# Patient Record
Sex: Female | Born: 1955 | Hispanic: Yes | Smoking: Never smoker
Health system: Southern US, Community
[De-identification: ages and names within clinical notes are randomized; demographics above are authoritative.]

## PROBLEM LIST (undated history)

## (undated) DIAGNOSIS — M1990 Unspecified osteoarthritis, unspecified site: Secondary | ICD-10-CM

## (undated) HISTORY — PX: OVARIAN CYST REMOVAL: SHX89

---

## 2014-06-18 ENCOUNTER — Ambulatory Visit: Payer: Self-pay | Admitting: Primary Care

## 2014-09-13 ENCOUNTER — Emergency Department: Payer: Self-pay | Admitting: Emergency Medicine

## 2015-12-16 ENCOUNTER — Other Ambulatory Visit: Payer: Self-pay | Admitting: Family Medicine

## 2015-12-16 DIAGNOSIS — E7800 Pure hypercholesterolemia, unspecified: Secondary | ICD-10-CM | POA: Insufficient documentation

## 2015-12-16 DIAGNOSIS — N6489 Other specified disorders of breast: Secondary | ICD-10-CM

## 2015-12-17 ENCOUNTER — Other Ambulatory Visit: Payer: Self-pay | Admitting: Family Medicine

## 2015-12-17 DIAGNOSIS — N6489 Other specified disorders of breast: Secondary | ICD-10-CM

## 2016-01-05 ENCOUNTER — Ambulatory Visit
Admission: RE | Admit: 2016-01-05 | Discharge: 2016-01-05 | Disposition: A | Discharge status: Home / Self Care | Payer: BLUE CROSS/BLUE SHIELD | Source: Ambulatory Visit | Attending: Family Medicine | Admitting: Family Medicine

## 2016-01-05 DIAGNOSIS — N6489 Other specified disorders of breast: Secondary | ICD-10-CM

## 2016-03-04 ENCOUNTER — Encounter: Payer: Self-pay | Admitting: *Deleted

## 2016-03-07 ENCOUNTER — Ambulatory Visit
Admission: RE | Admit: 2016-03-07 | Payer: BLUE CROSS/BLUE SHIELD | Source: Ambulatory Visit | Admitting: Unknown Physician Specialty

## 2016-03-07 ENCOUNTER — Encounter: Admission: RE | Payer: Self-pay | Source: Ambulatory Visit

## 2016-03-07 SURGERY — COLONOSCOPY WITH PROPOFOL
Anesthesia: General

## 2016-08-05 ENCOUNTER — Encounter: Payer: Self-pay | Admitting: Emergency Medicine

## 2016-08-05 ENCOUNTER — Emergency Department
Admission: EM | Admit: 2016-08-05 | Discharge: 2016-08-06 | Disposition: A | Discharge status: Home / Self Care | Payer: BLUE CROSS/BLUE SHIELD | Attending: Emergency Medicine | Admitting: Emergency Medicine

## 2016-08-05 DIAGNOSIS — T466X5A Adverse effect of antihyperlipidemic and antiarteriosclerotic drugs, initial encounter: Secondary | ICD-10-CM | POA: Insufficient documentation

## 2016-08-05 DIAGNOSIS — L519 Erythema multiforme, unspecified: Secondary | ICD-10-CM | POA: Insufficient documentation

## 2016-08-05 DIAGNOSIS — T50905A Adverse effect of unspecified drugs, medicaments and biological substances, initial encounter: Secondary | ICD-10-CM

## 2016-08-05 DIAGNOSIS — R21 Rash and other nonspecific skin eruption: Secondary | ICD-10-CM | POA: Diagnosis present

## 2016-08-05 LAB — BASIC METABOLIC PANEL
Anion gap: 7 (ref 5–15)
BUN: 13 mg/dL (ref 6–20)
CHLORIDE: 107 mmol/L (ref 101–111)
CO2: 26 mmol/L (ref 22–32)
CREATININE: 0.6 mg/dL (ref 0.44–1.00)
Calcium: 9.9 mg/dL (ref 8.9–10.3)
Glucose, Bld: 117 mg/dL — ABNORMAL HIGH (ref 65–99)
POTASSIUM: 3.8 mmol/L (ref 3.5–5.1)
SODIUM: 140 mmol/L (ref 135–145)

## 2016-08-05 LAB — CBC WITH DIFFERENTIAL/PLATELET
BASOS ABS: 0.1 10*3/uL (ref 0–0.1)
Basophils Relative: 1 %
EOS ABS: 0.2 10*3/uL (ref 0–0.7)
Eosinophils Relative: 3 %
HCT: 42.7 % (ref 35.0–47.0)
HEMOGLOBIN: 14.6 g/dL (ref 12.0–16.0)
LYMPHS ABS: 1.4 10*3/uL (ref 1.0–3.6)
Lymphocytes Relative: 20 %
MCH: 29.6 pg (ref 26.0–34.0)
MCHC: 34.2 g/dL (ref 32.0–36.0)
MCV: 86.6 fL (ref 80.0–100.0)
Monocytes Absolute: 0.6 10*3/uL (ref 0.2–0.9)
Monocytes Relative: 9 %
NEUTROS PCT: 67 %
Neutro Abs: 4.9 10*3/uL (ref 1.4–6.5)
Platelets: 223 10*3/uL (ref 150–440)
RBC: 4.92 MIL/uL (ref 3.80–5.20)
RDW: 13.9 % (ref 11.5–14.5)
WBC: 7.2 10*3/uL (ref 3.6–11.0)

## 2016-08-05 LAB — SEDIMENTATION RATE: SED RATE: 5 mm/h (ref 0–30)

## 2016-08-05 MED ORDER — SILVER SULFADIAZINE 1 % EX CREA
TOPICAL_CREAM | Freq: Once | CUTANEOUS | Status: AC
  Administered 2016-08-05: 1 via TOPICAL

## 2016-08-05 MED ORDER — FAMOTIDINE 20 MG PO TABS
40.0000 mg | ORAL_TABLET | Freq: Once | ORAL | Status: AC
  Administered 2016-08-05: 40 mg via ORAL
  Filled 2016-08-05: qty 2

## 2016-08-05 MED ORDER — DIPHENHYDRAMINE HCL 50 MG/ML IJ SOLN
25.0000 mg | Freq: Once | INTRAMUSCULAR | Status: AC
  Administered 2016-08-05: 25 mg via INTRAVENOUS
  Filled 2016-08-05: qty 1

## 2016-08-05 MED ORDER — SILVER SULFADIAZINE 1 % EX CREA: TOPICAL_CREAM | CUTANEOUS | 0 refills | Status: DC

## 2016-08-05 MED ORDER — METHYLPREDNISOLONE SODIUM SUCC 125 MG IJ SOLR
125.0000 mg | Freq: Once | INTRAMUSCULAR | Status: AC
  Administered 2016-08-05: 125 mg via INTRAVENOUS
  Filled 2016-08-05: qty 2

## 2016-08-05 MED ORDER — SILVER SULFADIAZINE 1 % EX CREA
TOPICAL_CREAM | CUTANEOUS | Status: AC
  Filled 2016-08-05: qty 85

## 2016-08-05 MED ORDER — CYPROHEPTADINE HCL 4 MG PO TABS: 4.0000 mg | ORAL_TABLET | Freq: Three times a day (TID) | ORAL | 0 refills | Status: DC | PRN

## 2016-08-05 MED ORDER — PREDNISONE 20 MG PO TABS: 20.0000 mg | ORAL_TABLET | Freq: Every day | ORAL | 0 refills | Status: DC

## 2016-08-05 MED ORDER — RANITIDINE HCL 150 MG PO TABS: 150.0000 mg | ORAL_TABLET | Freq: Two times a day (BID) | ORAL | 0 refills | Status: DC

## 2016-08-05 NOTE — ED Provider Notes (Signed)
[facility] Emergency Department Provider Note ____________________________________________  Time seen: 2056  I have reviewed the triage vital signs and the nursing notes.  HISTORY  Chief Complaint  Rash  HPI Alyssa Vega is a 61 y.o. female presents to the ED for evaluation of bilateral rash to the lower legs with itching. The patient reports onset of the rash better than 3 weeks prior. She denies any known injury, accident, trauma, exposure denies any drug allergies, food allergies, or environmental or allergies.She denies fevers, chills, SOB, or mucous membrane involvement. She admits to treating the itching with several home remedies including: alcohol, hydrogen peroxide, iodine, apple cider vinegar, and a garlic-lemon paste. She describes the rash begins as red 'rash' then blisters develop. She has open blister beds where she applied the garlic-lemon paste. She denies spread above the calves and lower legs.   History reviewed. No pertinent past medical history.  There are no active problems to display for this patient.  Past Surgical History:  Procedure Laterality Date  . OVARIAN CYST REMOVAL      Prior to Admission medications   Medication Sig Start Date End Date Taking? Authorizing Provider  acetaminophen (TYLENOL) 500 MG chewable tablet Chew 500 mg by mouth every 6 (six) hours as needed for pain.    Historical Provider, MD  Ascorbic Acid (VITAMIN C) 1000 MG tablet Take 1,000 mg by mouth daily.    Historical Provider, MD  b complex vitamins tablet Take 1 tablet by mouth daily.    Historical Provider, MD  calcium carbonate (OSCAL) 1500 (600 Ca) MG TABS tablet Take by mouth 2 (two) times daily with a meal.    Historical Provider, MD  Cholecalciferol 1000 units TBDP Take by mouth.    Historical Provider, MD  cyproheptadine (PERIACTIN) 4 MG tablet Take 1 tablet (4 mg total) by mouth 3 (three) times daily as needed for allergies. 08/05/16   Jeniece Hannis V Bacon  Jamiah Recore, PA-C  glucosamine-chondroitin 500-400 MG tablet Take 1 tablet by mouth 3 (three) times daily.    Historical Provider, MD  lovastatin (MEVACOR) 20 MG tablet Take 20 mg by mouth at bedtime.    Historical Provider, MD  predniSONE (DELTASONE) 20 MG tablet Take 1 tablet (20 mg total) by mouth daily with breakfast. 08/05/16   Dannielle Karvonen Quinnton Bury, PA-C  ranitidine (ZANTAC) 150 MG tablet Take 1 tablet (150 mg total) by mouth 2 (two) times daily. 08/05/16   Maite Burlison V Bacon Johann Santone, PA-C  silver sulfADIAZINE (SILVADENE) 1 % cream Apply to affected area daily 08/05/16   Theola Cuellar V Bacon Lonni Dirden, PA-C    Allergies Cyclobenzaprine and Meloxicam  Family History  Problem Relation Age of Onset  . Breast cancer Neg Hx     Social History Social History  Substance Use Topics  . Smoking status: Never Smoker  . Smokeless tobacco: Never Used  . Alcohol use No    Review of Systems  Constitutional: Negative for fever. Eyes: Negative for visual changes. ENT: Negative for sore throat. Cardiovascular: Negative for chest pain. Respiratory: Negative for shortness of breath. Gastrointestinal: Negative for abdominal pain, vomiting and diarrhea. Genitourinary: Negative for dysuria. Musculoskeletal: Negative for back pain. Skin: Negative for rash. Neurological: Negative for headaches, focal weakness or numbness. ____________________________________________  PHYSICAL EXAM:  VITAL SIGNS: ED Triage Vitals  Enc Vitals Group     BP 08/05/16 2037 (!) 158/72     Pulse Rate 08/05/16 2037 68     Resp 08/05/16 2037 14  Temp 08/05/16 2037 98.3 F (36.8 C)     Temp Source 08/05/16 2037 Oral     SpO2 08/05/16 2037 100 %     Weight 08/05/16 2037 145 lb (65.8 kg)     Height --      Head Circumference --      Peak Flow --      Pain Score 08/05/16 2038 0     Pain Loc --      Pain Edu? --      Excl. in [place]? --     Constitutional: Alert and oriented. Well appearing and in no distress. Head:  Normocephalic and atraumatic. Eyes: Conjunctivae are normal. PERRL. Normal extraocular movements Ears: Canals clear. TMs intact bilaterally. Nose: No congestion/rhinorrhea/epistaxis. Mouth/Throat: Mucous membranes are moist. Neck: Supple. No thyromegaly. Hematological/Lymphatic/Immunological: No cervical lymphadenopathy. Cardiovascular: Normal rate, regular rhythm. Normal distal pulses. Respiratory: Normal respiratory effort. No wheezes/rales/rhonchi. Gastrointestinal: Soft and nontender. No distention. Musculoskeletal: Nontender with normal range of motion in all extremities.  Neurologic:  Normal gait without ataxia. Normal speech and language. No gross focal neurologic deficits are appreciated. Skin:  Skin is warm, dry and intact. Patient with multiple hyperpigmented flat blisters to the lower legs bilaterally. They appear to be scarred from irritant contact. She also has erythematous, non-palpable, non-blanchable lesions over the calves bilaterally.  ____________________________________________   LABS (pertinent positives/negatives)  Labs Reviewed  BASIC METABOLIC PANEL - Abnormal; Notable for the following:       Result Value   Glucose, Bld 117 (*)    All other components within normal limits  CBC WITH DIFFERENTIAL/PLATELET  SEDIMENTATION RATE  C-REACTIVE PROTEIN  ____________________________________________  PROCEDURES  Solumedrol 125 mg IVP Benadryl 25 mg IVP Famotidine 40 mg PO Silvadene ointment  ____________________________________________  INITIAL IMPRESSION / ASSESSMENT AND PLAN / ED COURSE  Patient with a bilateral lower extremities rash which may represent a drug reaction secondary to her statin medication. Patient is advised to discontinue dosing of her statin at this time. She'll be discharged with prescriptions for any bleeding, Periactin, and prednisone. She will also apply Silvadene cream to the legs open wound. She is advised to discontinue all over-the-counter  topical medications at home remedies previously utilized. She will follow-up with her primary care provider or [facility] for ongoing symptom management.  ____________________________________________  FINAL CLINICAL IMPRESSION(S) / ED DIAGNOSES  Final diagnoses:  Adverse effect of drug, initial encounter  Erythema multiforme      Melvenia Needles, PA-C 08/06/16 0009    Nena Polio, MD 08/08/16 [phone]

## 2016-08-05 NOTE — ED Triage Notes (Signed)
Pt with bilateral lower leg rash and pain with itching. Pt with flat rash almost like venous stasis ulcers but flat noted to bilateral lower legs from ankles to mid tibial areas. Pt denies known fever. Pt does have areas of open skin with serous drainage noted.

## 2016-08-05 NOTE — Discharge Instructions (Signed)
Your labs were normal today. Your rash may be related to your cholesterol medicine. You should consider stopping the medicine and talking to your provider about other options. Keep the skin clean with soap & water only. Apply the antibiotic ointment on the open wounds and cover with a dressing. Take the prescription meds for itch and inflammation as directed.

## 2016-08-05 NOTE — ED Notes (Signed)
Flat darkened purple to red/pink rash noted to bilteral lowre extremities. Pt states it itches and is painful with itching. Pt does have an area of open skin noted to left lateral mid tibial area that is approx 2.4 cmx2 cm. Drainage is serous. Pt states rash has been present for 2 weeks. Pt states she has been putting lemon juice and garlic on legs for 2 weeks but they are getting worse. Pt denies medical problems or other symptoms. Pt denies pain while at rest.

## 2016-08-05 NOTE — ED Notes (Signed)
Patient ambulatory to stat desk without difficulty or distress noted.  Reports bilateral leg pain for approximately 2 weeks.

## 2016-08-06 LAB — C-REACTIVE PROTEIN: CRP: <0.8 mg/dL (ref <1.0)

## 2017-01-17 DIAGNOSIS — R519 Headache, unspecified: Secondary | ICD-10-CM | POA: Insufficient documentation

## 2017-11-27 ENCOUNTER — Other Ambulatory Visit: Payer: Self-pay | Admitting: Family Medicine

## 2017-11-27 DIAGNOSIS — Z1231 Encounter for screening mammogram for malignant neoplasm of breast: Secondary | ICD-10-CM

## 2017-12-15 ENCOUNTER — Ambulatory Visit
Admission: RE | Admit: 2017-12-15 | Discharge: 2017-12-15 | Disposition: A | Discharge status: Home / Self Care | Payer: BLUE CROSS/BLUE SHIELD | Source: Ambulatory Visit | Attending: Family Medicine | Admitting: Family Medicine

## 2017-12-15 DIAGNOSIS — Z1231 Encounter for screening mammogram for malignant neoplasm of breast: Secondary | ICD-10-CM | POA: Diagnosis not present

## 2017-12-18 ENCOUNTER — Other Ambulatory Visit: Payer: Self-pay | Admitting: Family Medicine

## 2017-12-18 DIAGNOSIS — N6489 Other specified disorders of breast: Secondary | ICD-10-CM

## 2017-12-18 DIAGNOSIS — R928 Other abnormal and inconclusive findings on diagnostic imaging of breast: Secondary | ICD-10-CM

## 2017-12-22 ENCOUNTER — Ambulatory Visit
Admission: RE | Admit: 2017-12-22 | Discharge: 2017-12-22 | Disposition: A | Discharge status: Home / Self Care | Payer: BLUE CROSS/BLUE SHIELD | Source: Ambulatory Visit | Attending: Family Medicine | Admitting: Family Medicine

## 2017-12-22 DIAGNOSIS — N6489 Other specified disorders of breast: Secondary | ICD-10-CM

## 2017-12-22 DIAGNOSIS — R928 Other abnormal and inconclusive findings on diagnostic imaging of breast: Secondary | ICD-10-CM | POA: Diagnosis not present

## 2018-07-11 ENCOUNTER — Other Ambulatory Visit: Payer: Self-pay

## 2018-07-11 ENCOUNTER — Emergency Department: Payer: BLUE CROSS/BLUE SHIELD

## 2018-07-11 ENCOUNTER — Emergency Department
Admission: EM | Admit: 2018-07-11 | Discharge: 2018-07-11 | Disposition: A | Discharge status: Home / Self Care | Payer: BLUE CROSS/BLUE SHIELD | Attending: Emergency Medicine | Admitting: Emergency Medicine

## 2018-07-11 DIAGNOSIS — R05 Cough: Secondary | ICD-10-CM | POA: Insufficient documentation

## 2018-07-11 DIAGNOSIS — R748 Abnormal levels of other serum enzymes: Secondary | ICD-10-CM | POA: Insufficient documentation

## 2018-07-11 DIAGNOSIS — J111 Influenza due to unidentified influenza virus with other respiratory manifestations: Secondary | ICD-10-CM | POA: Diagnosis not present

## 2018-07-11 DIAGNOSIS — R0602 Shortness of breath: Secondary | ICD-10-CM | POA: Insufficient documentation

## 2018-07-11 DIAGNOSIS — R69 Illness, unspecified: Secondary | ICD-10-CM

## 2018-07-11 DIAGNOSIS — Z79899 Other long term (current) drug therapy: Secondary | ICD-10-CM | POA: Diagnosis not present

## 2018-07-11 DIAGNOSIS — R509 Fever, unspecified: Secondary | ICD-10-CM | POA: Diagnosis present

## 2018-07-11 LAB — URINALYSIS, COMPLETE (UACMP) WITH MICROSCOPIC
Bacteria, UA: NONE SEEN
Bilirubin Urine: NEGATIVE
Glucose, UA: NEGATIVE mg/dL
Hgb urine dipstick: NEGATIVE
Ketones, ur: NEGATIVE mg/dL
Leukocytes, UA: NEGATIVE
Nitrite: NEGATIVE
Protein, ur: NEGATIVE mg/dL
SPECIFIC GRAVITY, URINE: 1.004 — AB (ref 1.005–1.030)
Squamous Epithelial / HPF: NONE SEEN (ref 0–5)
pH: 7 (ref 5.0–8.0)

## 2018-07-11 LAB — FIBRIN DERIVATIVES D-DIMER (ARMC ONLY): Fibrin derivatives D-dimer (ARMC): 399.49 ng/mL (FEU) (ref 0.00–499.00)

## 2018-07-11 LAB — COMPREHENSIVE METABOLIC PANEL
ALK PHOS: 69 U/L (ref 38–126)
ALT: 111 U/L — ABNORMAL HIGH (ref 0–44)
AST: 61 U/L — ABNORMAL HIGH (ref 15–41)
Albumin: 4.7 g/dL (ref 3.5–5.0)
Anion gap: 10 (ref 5–15)
BUN: 11 mg/dL (ref 8–23)
CO2: 23 mmol/L (ref 22–32)
Calcium: 9.5 mg/dL (ref 8.9–10.3)
Chloride: 106 mmol/L (ref 98–111)
Creatinine, Ser: 0.5 mg/dL (ref 0.44–1.00)
GFR calc Af Amer: >60 mL/min (ref >60)
GFR calc non Af Amer: >60 mL/min (ref >60)
Glucose, Bld: 121 mg/dL — ABNORMAL HIGH (ref 70–99)
Potassium: 3.6 mmol/L (ref 3.5–5.1)
Sodium: 139 mmol/L (ref 135–145)
Total Bilirubin: 1 mg/dL (ref 0.3–1.2)
Total Protein: 7.2 g/dL (ref 6.5–8.1)

## 2018-07-11 LAB — INFLUENZA PANEL BY PCR (TYPE A & B)
Influenza A By PCR: NEGATIVE
Influenza B By PCR: NEGATIVE

## 2018-07-11 LAB — TROPONIN I: Troponin I: <0.03 ng/mL (ref <0.03)

## 2018-07-11 LAB — CBC
HCT: 44.5 % (ref 36.0–46.0)
HEMOGLOBIN: 14.9 g/dL (ref 12.0–15.0)
MCH: 29.3 pg (ref 26.0–34.0)
MCHC: 33.5 g/dL (ref 30.0–36.0)
MCV: 87.4 fL (ref 80.0–100.0)
Platelets: 201 10*3/uL (ref 150–400)
RBC: 5.09 MIL/uL (ref 3.87–5.11)
RDW: 13.5 % (ref 11.5–15.5)
WBC: 5 10*3/uL (ref 4.0–10.5)
nRBC: 0 % (ref 0.0–0.2)

## 2018-07-11 LAB — LIPASE, BLOOD: LIPASE: 37 U/L (ref 11–51)

## 2018-07-11 LAB — BRAIN NATRIURETIC PEPTIDE: B NATRIURETIC PEPTIDE 5: 49 pg/mL (ref 0.0–100.0)

## 2018-07-11 MED ORDER — ALBUTEROL SULFATE HFA 108 (90 BASE) MCG/ACT IN AERS: 2.0000 | INHALATION_SPRAY | Freq: Four times a day (QID) | RESPIRATORY_TRACT | 0 refills | Status: DC | PRN

## 2018-07-11 MED ORDER — IPRATROPIUM-ALBUTEROL 0.5-2.5 (3) MG/3ML IN SOLN
3.0000 mL | Freq: Once | RESPIRATORY_TRACT | Status: AC
  Administered 2018-07-11: 3 mL via RESPIRATORY_TRACT
  Filled 2018-07-11: qty 3

## 2018-07-11 MED ORDER — SODIUM CHLORIDE 0.9 % IV BOLUS
1000.0000 mL | Freq: Once | INTRAVENOUS | Status: AC
  Administered 2018-07-11: 1000 mL via INTRAVENOUS

## 2018-07-11 MED ORDER — PREDNISONE 20 MG PO TABS
60.0000 mg | ORAL_TABLET | Freq: Once | ORAL | Status: AC
  Administered 2018-07-11: 60 mg via ORAL
  Filled 2018-07-11: qty 3

## 2018-07-11 NOTE — ED Notes (Signed)
Patient transported to X-ray 

## 2018-07-11 NOTE — ED Provider Notes (Signed)
[facility] Emergency Department Provider Note  ____________________________________________  Time seen: Approximately 11:18 AM  I have reviewed the triage vital signs and the nursing notes.   HISTORY  Chief Complaint Fever    HPI Alyssa Vega is a 62 y.o. female that presents to the emergency department for evaluation of feeling warm, tired, shortness of breath, nausea, body aches for 5 days. She felt like she had a fever yesterday but did not take her temperature.  She has had an occasional nonproductive cough.  She has also had an occasional headache. Headache wraps around her head.  She does not have a headache currently.  She has had nausea but no vomiting.  She had 5 loose stools yesterday.  No blood or mucus in stools.  Patient states that she has had episodes of tiredness and weakness for several months that she has felt like it was coming from her tizanidine that she takes for chronic pain.  Self discontinued medication 2 weeks ago. She seemed to be feeling better until over the weekend. No vomiting.  No sick contacts. She does not smoke.  No nasal congestion, sore throat, chest pain, abdominal pain.   No past medical history on file.  There are no active problems to display for this patient.   Past Surgical History:  Procedure Laterality Date  . OVARIAN CYST REMOVAL      Prior to Admission medications   Medication Sig Start Date End Date Taking? Authorizing Provider  acetaminophen (TYLENOL) 500 MG chewable tablet Chew 500 mg by mouth every 6 (six) hours as needed for pain.    [provider]  albuterol (PROVENTIL HFA;VENTOLIN HFA) 108 (90 Base) MCG/ACT inhaler Inhale 2 puffs into the lungs every 6 (six) hours as needed for wheezing or shortness of breath. 07/11/18   Enid Derry, PA-C  Ascorbic Acid (VITAMIN C) 1000 MG tablet Take 1,000 mg by mouth daily.    [provider]  b complex vitamins tablet Take 1 tablet by mouth  daily.    [provider]  calcium carbonate (OSCAL) 1500 (600 Ca) MG TABS tablet Take by mouth 2 (two) times daily with a meal.    [provider]  Cholecalciferol 1000 units TBDP Take by mouth.    [provider]  cyproheptadine (PERIACTIN) 4 MG tablet Take 1 tablet (4 mg total) by mouth 3 (three) times daily as needed for allergies. 08/05/16   Menshew, Charlesetta Ivory, PA-C  glucosamine-chondroitin 500-400 MG tablet Take 1 tablet by mouth 3 (three) times daily.    [provider]  lovastatin (MEVACOR) 20 MG tablet Take 20 mg by mouth at bedtime.    [provider]  predniSONE (DELTASONE) 20 MG tablet Take 1 tablet (20 mg total) by mouth daily with breakfast. 08/05/16   Menshew, Charlesetta Ivory, PA-C  ranitidine (ZANTAC) 150 MG tablet Take 1 tablet (150 mg total) by mouth 2 (two) times daily. 08/05/16   Menshew, Charlesetta Ivory, PA-C  silver sulfADIAZINE (SILVADENE) 1 % cream Apply to affected area daily 08/05/16   Menshew, Charlesetta Ivory, PA-C    Allergies Cyclobenzaprine and Meloxicam  Family History  Problem Relation Age of Onset  . Breast cancer Neg Hx     Social History Social History   Tobacco Use  . Smoking status: Never Smoker  . Smokeless tobacco: Never Used  Substance Use Topics  . Alcohol use: No  . Drug use: No     Review of Systems  Constitutional: Positive for feeling warm. Eyes: No visual changes. No discharge. ENT: Negative for congestion and rhinorrhea. Cardiovascular: No chest pain. Respiratory: Positive for cough and SOB. Gastrointestinal: No abdominal pain.  No nausea, no vomiting.  Positive for diarrhea.  No constipation. Musculoskeletal: Positive for body aches. Skin: Negative for rash, abrasions, lacerations, ecchymosis. Neurological: Positive for headaches.   ____________________________________________   PHYSICAL EXAM:  VITAL SIGNS: ED Triage Vitals  Enc Vitals Group     BP 07/11/18 1056 (!) 156/75      Pulse Rate 07/11/18 1056 77     Resp 07/11/18 1056 16     Temp 07/11/18 1056 97.7 F (36.5 C)     Temp Source 07/11/18 1056 Oral     SpO2 07/11/18 1056 97 %     Weight 07/11/18 1102 145 lb (65.8 kg)     Height 07/11/18 1102 5\' 2"  (1.575 m)     Head Circumference --      Peak Flow --      Pain Score 07/11/18 1101 0     Pain Loc --      Pain Edu? --      Excl. in [place]? --      Constitutional: Alert and oriented. Well appearing and in no acute distress. Eyes: Conjunctivae are normal. PERRL. EOMI. No discharge. Head: Atraumatic. ENT: No frontal and maxillary sinus tenderness.      Ears: Tympanic membranes pearly gray with good landmarks. No discharge.      Nose: No congestion/rhinnorhea.      Mouth/Throat: Mucous membranes are moist. Oropharynx non-erythematous. Tonsils not enlarged. No exudates. Uvula midline. Neck: No stridor.   Hematological/Lymphatic/Immunilogical: No cervical lymphadenopathy. Cardiovascular: Normal rate, regular rhythm.  Good peripheral circulation. Respiratory: Normal respiratory effort without tachypnea or retractions. Lungs CTAB. Good air entry to the bases with no decreased or absent breath sounds. Gastrointestinal: Bowel sounds 4 quadrants. Soft and nontender to palpation. No guarding or rigidity. No palpable masses. No distention. Musculoskeletal: Full range of motion to all extremities. No gross deformities appreciated.  Normal gait. Neurologic:  Normal speech and language. No gross focal neurologic deficits are appreciated.  Skin:  Skin is warm, dry and intact. No rash noted. Psychiatric: Mood and affect are normal. Speech and behavior are normal. Patient exhibits appropriate insight and judgement.   ____________________________________________   LABS (all labs ordered are listed, but only abnormal results are displayed)  Labs Reviewed  COMPREHENSIVE METABOLIC PANEL - Abnormal; Notable for the following components:      Result Value   Glucose, Bld  121 (*)    AST 61 (*)    ALT 111 (*)    All other components within normal limits  URINALYSIS, COMPLETE (UACMP) WITH MICROSCOPIC - Abnormal; Notable for the following components:   Color, Urine STRAW (*)    APPearance CLEAR (*)    Specific Gravity, Urine 1.004 (*)    All other components within normal limits  INFLUENZA PANEL BY PCR (TYPE A & B)  CBC  LIPASE, BLOOD  TROPONIN I  FIBRIN DERIVATIVES D-DIMER ([facility] ONLY)  BRAIN NATRIURETIC PEPTIDE   ____________________________________________  EKG  SR ____________________________________________  RADIOLOGY Lexine BatonI, Kalisa Girtman, personally viewed and evaluated these images (plain radiographs) as part of my medical decision making, as well as reviewing the written report by the radiologist.  Dg Chest 2 View  Result Date: 07/11/2018 CLINICAL DATA:  Fever, shortness of breath, vomiting. EXAM: CHEST - 2 VIEW COMPARISON:  None FINDINGS: Thoracic spondylosis. Atherosclerotic calcification of the  aortic arch. Heart size within normal limits. The lungs appear clear. IMPRESSION: 1. No pneumonia is identified. 2. Thoracic spondylosis. 3.  Aortic Atherosclerosis (ICD10-I70.0). Electronically Signed   By: Gaylyn RongWalter  Liebkemann M.D.   On: 07/11/2018 12:15    ____________________________________________    PROCEDURES  Procedure(s) performed:    Procedures    Medications  sodium chloride 0.9 % bolus 1,000 mL (0 mLs Intravenous Stopped 07/11/18 1408)  ipratropium-albuterol (DUONEB) 0.5-2.5 (3) MG/3ML nebulizer solution 3 mL (3 mLs Nebulization Given 07/11/18 1245)  predniSONE (DELTASONE) tablet 60 mg (60 mg Oral Given 07/11/18 1414)     ____________________________________________   INITIAL IMPRESSION / ASSESSMENT AND PLAN / ED COURSE  Pertinent labs & imaging results that were available during my care of the patient were reviewed by me and considered in my medical decision making (see chart for details).  Review of the [place] CSRS was  performed in accordance of the [facility] prior to dispensing any controlled drugs.   Patient presented to the emergency department for evaluation of multiple symptoms. Vital signs and exam are reassuring.  Chest x-ray negative for acute cardiopulmonary processes.  Lungs are clear to auscultation bilaterally.  Influenza test is negative but patient has had symptoms for more than 48 hours so this may not be accurate.  CBC is within normal limits.  CMP remarkable for slightly increased liver enzymes that patient will follow-up with primary care for.  No vomiting or abdominal pain.  Abdomen is soft and nontender.  Troponin is negative.  D-dimer and BNP is within normal limits.  EKG shows normal sinus rhythm.  No infection on urinalysis.  Patient felt better in the emergency department after receiving fluids and DuoNeb.  Patient appears well and is staying well hydrated.  Patient appears comfortable.  Patient feels comfortable going home. Patient will be discharged home with prescriptions for albuterol inhaler. Patient is to follow up with primary care as needed or otherwise directed. Patient is given ED precautions to return to the ED for any worsening or new symptoms.     ____________________________________________  FINAL CLINICAL IMPRESSION(S) / ED DIAGNOSES  Final diagnoses:  Influenza-like illness  Elevated liver enzymes      NEW MEDICATIONS STARTED DURING THIS VISIT:  ED Discharge Orders         Ordered    albuterol (PROVENTIL HFA;VENTOLIN HFA) 108 (90 Base) MCG/ACT inhaler  Every 6 hours PRN     07/11/18 1401              This chart was dictated using voice recognition software/Dragon. Despite best efforts to proofread, errors can occur which can change the meaning. Any change was purely unintentional.    Enid DerryWagner, Jadarrius Maselli, PA-C 07/11/18 1654    Emily FilbertWilliams, Jonathan E, MD 07/12/18 [phone]

## 2018-07-11 NOTE — Discharge Instructions (Addendum)
Your blood work and testing is reassuring.  It sounds like you have a virus.  Please use albuterol inhaler as needed for shortness of breath.  Your liver enzymes were mildly elevated in the emergency department.  Please follow-up with primary care this week or early next week for recheck and further work-up.

## 2018-07-11 NOTE — ED Triage Notes (Addendum)
Yesterday fever. States tired and HA. Vomiting but no diarrhea. A&O, ambulatory.   Requesting interpreter. Afebrile in triage.

## 2019-03-21 DIAGNOSIS — Z8673 Personal history of transient ischemic attack (TIA), and cerebral infarction without residual deficits: Secondary | ICD-10-CM | POA: Insufficient documentation

## 2019-04-01 ENCOUNTER — Other Ambulatory Visit: Payer: Self-pay | Admitting: Physical Medicine and Rehabilitation

## 2019-04-01 DIAGNOSIS — M5412 Radiculopathy, cervical region: Secondary | ICD-10-CM

## 2019-04-01 DIAGNOSIS — M5414 Radiculopathy, thoracic region: Secondary | ICD-10-CM

## 2019-04-11 ENCOUNTER — Ambulatory Visit
Admission: RE | Admit: 2019-04-11 | Discharge: 2019-04-11 | Disposition: A | Discharge status: Home / Self Care | Payer: BLUE CROSS/BLUE SHIELD | Source: Ambulatory Visit | Attending: Physical Medicine and Rehabilitation | Admitting: Physical Medicine and Rehabilitation

## 2019-04-11 ENCOUNTER — Other Ambulatory Visit: Payer: Self-pay

## 2019-04-11 DIAGNOSIS — M5414 Radiculopathy, thoracic region: Secondary | ICD-10-CM | POA: Insufficient documentation

## 2019-04-11 DIAGNOSIS — M5412 Radiculopathy, cervical region: Secondary | ICD-10-CM | POA: Insufficient documentation

## 2019-05-03 DIAGNOSIS — F411 Generalized anxiety disorder: Secondary | ICD-10-CM | POA: Insufficient documentation

## 2019-05-10 ENCOUNTER — Other Ambulatory Visit (HOSPITAL_COMMUNITY): Payer: Self-pay | Admitting: Student

## 2019-05-10 ENCOUNTER — Other Ambulatory Visit: Payer: Self-pay | Admitting: Student

## 2019-05-10 DIAGNOSIS — R1013 Epigastric pain: Secondary | ICD-10-CM

## 2019-05-27 ENCOUNTER — Ambulatory Visit
Admission: RE | Admit: 2019-05-27 | Discharge: 2019-05-27 | Disposition: A | Discharge status: Home / Self Care | Payer: BLUE CROSS/BLUE SHIELD | Source: Ambulatory Visit | Attending: Student | Admitting: Student

## 2019-05-27 ENCOUNTER — Other Ambulatory Visit: Payer: Self-pay

## 2019-05-27 DIAGNOSIS — R1013 Epigastric pain: Secondary | ICD-10-CM | POA: Insufficient documentation

## 2019-06-28 ENCOUNTER — Other Ambulatory Visit: Payer: Self-pay | Admitting: Obstetrics and Gynecology

## 2019-06-28 DIAGNOSIS — Z1231 Encounter for screening mammogram for malignant neoplasm of breast: Secondary | ICD-10-CM

## 2019-08-23 ENCOUNTER — Other Ambulatory Visit: Payer: Self-pay | Admitting: Student

## 2019-08-23 DIAGNOSIS — R1010 Upper abdominal pain, unspecified: Secondary | ICD-10-CM

## 2019-08-23 DIAGNOSIS — R110 Nausea: Secondary | ICD-10-CM

## 2019-09-02 ENCOUNTER — Ambulatory Visit: Payer: 59

## 2019-09-16 ENCOUNTER — Ambulatory Visit
Admission: RE | Admit: 2019-09-16 | Discharge: 2019-09-16 | Disposition: A | Discharge status: Home / Self Care | Payer: 59 | Source: Ambulatory Visit | Attending: Student | Admitting: Student

## 2019-09-16 ENCOUNTER — Other Ambulatory Visit: Payer: Self-pay

## 2019-09-16 DIAGNOSIS — R1010 Upper abdominal pain, unspecified: Secondary | ICD-10-CM

## 2019-09-16 DIAGNOSIS — R110 Nausea: Secondary | ICD-10-CM | POA: Diagnosis present

## 2019-09-16 MED ORDER — TECHNETIUM TC 99M MEBROFENIN IV KIT
5.0000 | PACK | Freq: Once | INTRAVENOUS | Status: AC | PRN
  Administered 2019-09-16: 5.15 via INTRAVENOUS

## 2019-09-23 DIAGNOSIS — K760 Fatty (change of) liver, not elsewhere classified: Secondary | ICD-10-CM | POA: Insufficient documentation

## 2019-09-23 DIAGNOSIS — M5412 Radiculopathy, cervical region: Secondary | ICD-10-CM | POA: Insufficient documentation

## 2019-10-07 ENCOUNTER — Ambulatory Visit: Payer: 59

## 2019-10-16 ENCOUNTER — Ambulatory Visit: Admit: 2019-10-16 | Payer: 59 | Admitting: General Surgery

## 2019-10-16 SURGERY — ESOPHAGOGASTRODUODENOSCOPY (EGD) WITH PROPOFOL
Anesthesia: General

## 2020-03-10 ENCOUNTER — Other Ambulatory Visit: Payer: Self-pay | Admitting: Family Medicine

## 2020-03-10 DIAGNOSIS — M5441 Lumbago with sciatica, right side: Secondary | ICD-10-CM

## 2020-03-25 ENCOUNTER — Other Ambulatory Visit: Payer: Self-pay | Admitting: Physical Medicine and Rehabilitation

## 2020-03-25 DIAGNOSIS — M5412 Radiculopathy, cervical region: Secondary | ICD-10-CM

## 2020-03-30 ENCOUNTER — Ambulatory Visit
Admission: RE | Admit: 2020-03-30 | Discharge: 2020-03-30 | Disposition: A | Discharge status: Home / Self Care | Payer: Medicare Other | Source: Ambulatory Visit | Attending: Family Medicine | Admitting: Family Medicine

## 2020-03-30 ENCOUNTER — Ambulatory Visit
Admission: RE | Admit: 2020-03-30 | Discharge: 2020-03-30 | Disposition: A | Discharge status: Home / Self Care | Payer: Medicare Other | Source: Ambulatory Visit | Attending: Physical Medicine and Rehabilitation | Admitting: Physical Medicine and Rehabilitation

## 2020-03-30 ENCOUNTER — Other Ambulatory Visit: Payer: Self-pay

## 2020-03-30 DIAGNOSIS — M5441 Lumbago with sciatica, right side: Secondary | ICD-10-CM | POA: Insufficient documentation

## 2020-03-30 DIAGNOSIS — M5412 Radiculopathy, cervical region: Secondary | ICD-10-CM | POA: Insufficient documentation

## 2020-04-03 ENCOUNTER — Encounter: Payer: Self-pay | Admitting: Emergency Medicine

## 2020-04-03 ENCOUNTER — Inpatient Hospital Stay
Admission: EM | Admit: 2020-04-03 | Discharge: 2020-04-05 | DRG: 645 | Disposition: A | Discharge status: Home / Self Care | Payer: Medicare Other | Attending: Obstetrics and Gynecology | Admitting: Obstetrics and Gynecology

## 2020-04-03 ENCOUNTER — Emergency Department: Payer: Medicare Other

## 2020-04-03 ENCOUNTER — Other Ambulatory Visit: Payer: Self-pay

## 2020-04-03 DIAGNOSIS — T43215A Adverse effect of selective serotonin and norepinephrine reuptake inhibitors, initial encounter: Secondary | ICD-10-CM | POA: Diagnosis present

## 2020-04-03 DIAGNOSIS — M1990 Unspecified osteoarthritis, unspecified site: Secondary | ICD-10-CM | POA: Diagnosis present

## 2020-04-03 DIAGNOSIS — K529 Noninfective gastroenteritis and colitis, unspecified: Secondary | ICD-10-CM | POA: Diagnosis present

## 2020-04-03 DIAGNOSIS — Z85118 Personal history of other malignant neoplasm of bronchus and lung: Secondary | ICD-10-CM | POA: Diagnosis not present

## 2020-04-03 DIAGNOSIS — R1030 Lower abdominal pain, unspecified: Secondary | ICD-10-CM

## 2020-04-03 DIAGNOSIS — Z886 Allergy status to analgesic agent status: Secondary | ICD-10-CM

## 2020-04-03 DIAGNOSIS — K219 Gastro-esophageal reflux disease without esophagitis: Secondary | ICD-10-CM | POA: Diagnosis present

## 2020-04-03 DIAGNOSIS — Z6828 Body mass index (BMI) 28.0-28.9, adult: Secondary | ICD-10-CM

## 2020-04-03 DIAGNOSIS — E222 Syndrome of inappropriate secretion of antidiuretic hormone: Secondary | ICD-10-CM | POA: Diagnosis present

## 2020-04-03 DIAGNOSIS — E871 Hypo-osmolality and hyponatremia: Secondary | ICD-10-CM | POA: Diagnosis not present

## 2020-04-03 DIAGNOSIS — Z888 Allergy status to other drugs, medicaments and biological substances status: Secondary | ICD-10-CM | POA: Diagnosis not present

## 2020-04-03 DIAGNOSIS — Z20822 Contact with and (suspected) exposure to covid-19: Secondary | ICD-10-CM | POA: Diagnosis present

## 2020-04-03 DIAGNOSIS — Z79899 Other long term (current) drug therapy: Secondary | ICD-10-CM | POA: Diagnosis not present

## 2020-04-03 DIAGNOSIS — I10 Essential (primary) hypertension: Secondary | ICD-10-CM | POA: Diagnosis present

## 2020-04-03 DIAGNOSIS — M545 Low back pain: Secondary | ICD-10-CM | POA: Diagnosis present

## 2020-04-03 DIAGNOSIS — G8929 Other chronic pain: Secondary | ICD-10-CM | POA: Diagnosis present

## 2020-04-03 DIAGNOSIS — R202 Paresthesia of skin: Secondary | ICD-10-CM | POA: Diagnosis present

## 2020-04-03 DIAGNOSIS — R634 Abnormal weight loss: Secondary | ICD-10-CM | POA: Diagnosis present

## 2020-04-03 DIAGNOSIS — R1084 Generalized abdominal pain: Secondary | ICD-10-CM | POA: Diagnosis not present

## 2020-04-03 DIAGNOSIS — E876 Hypokalemia: Secondary | ICD-10-CM | POA: Diagnosis present

## 2020-04-03 HISTORY — DX: Unspecified osteoarthritis, unspecified site: M19.90

## 2020-04-03 LAB — URINALYSIS, COMPLETE (UACMP) WITH MICROSCOPIC
Bacteria, UA: NONE SEEN
Bilirubin Urine: NEGATIVE
Glucose, UA: NEGATIVE mg/dL
Ketones, ur: 5 mg/dL — AB
Leukocytes,Ua: NEGATIVE
Nitrite: NEGATIVE
Protein, ur: NEGATIVE mg/dL
Specific Gravity, Urine: 1.018 (ref 1.005–1.030)
pH: 7 (ref 5.0–8.0)

## 2020-04-03 LAB — BASIC METABOLIC PANEL
Anion gap: 10 (ref 5–15)
BUN: 6 mg/dL — ABNORMAL LOW (ref 8–23)
CO2: 21 mmol/L — ABNORMAL LOW (ref 22–32)
Calcium: 8.7 mg/dL — ABNORMAL LOW (ref 8.9–10.3)
Chloride: 93 mmol/L — ABNORMAL LOW (ref 98–111)
Creatinine, Ser: 0.45 mg/dL (ref 0.44–1.00)
GFR calc Af Amer: >60 mL/min (ref >60)
GFR calc non Af Amer: >60 mL/min (ref >60)
Glucose, Bld: 104 mg/dL — ABNORMAL HIGH (ref 70–99)
Potassium: 3.3 mmol/L — ABNORMAL LOW (ref 3.5–5.1)
Sodium: 124 mmol/L — ABNORMAL LOW (ref 135–145)

## 2020-04-03 LAB — COMPREHENSIVE METABOLIC PANEL
ALT: 22 U/L (ref 0–44)
AST: 28 U/L (ref 15–41)
Albumin: 4.2 g/dL (ref 3.5–5.0)
Alkaline Phosphatase: 67 U/L (ref 38–126)
Anion gap: 11 (ref 5–15)
BUN: 7 mg/dL — ABNORMAL LOW (ref 8–23)
CO2: 19 mmol/L — ABNORMAL LOW (ref 22–32)
Calcium: 9 mg/dL (ref 8.9–10.3)
Chloride: 90 mmol/L — ABNORMAL LOW (ref 98–111)
Creatinine, Ser: 0.37 mg/dL — ABNORMAL LOW (ref 0.44–1.00)
GFR calc Af Amer: >60 mL/min (ref >60)
GFR calc non Af Amer: >60 mL/min (ref >60)
Glucose, Bld: 110 mg/dL — ABNORMAL HIGH (ref 70–99)
Potassium: 3.1 mmol/L — ABNORMAL LOW (ref 3.5–5.1)
Sodium: 120 mmol/L — ABNORMAL LOW (ref 135–145)
Total Bilirubin: 0.7 mg/dL (ref 0.3–1.2)
Total Protein: 7.2 g/dL (ref 6.5–8.1)

## 2020-04-03 LAB — CBC
HCT: 38.4 % (ref 36.0–46.0)
Hemoglobin: 13.9 g/dL (ref 12.0–15.0)
MCH: 30 pg (ref 26.0–34.0)
MCHC: 36.2 g/dL — ABNORMAL HIGH (ref 30.0–36.0)
MCV: 82.8 fL (ref 80.0–100.0)
Platelets: 247 10*3/uL (ref 150–400)
RBC: 4.64 MIL/uL (ref 3.87–5.11)
RDW: 12.9 % (ref 11.5–15.5)
WBC: 6.8 10*3/uL (ref 4.0–10.5)
nRBC: 0 % (ref 0.0–0.2)

## 2020-04-03 LAB — LIPASE, BLOOD: Lipase: 34 U/L (ref 11–51)

## 2020-04-03 LAB — SARS CORONAVIRUS 2 BY RT PCR (HOSPITAL ORDER, PERFORMED IN ~~LOC~~ HOSPITAL LAB): SARS Coronavirus 2: NEGATIVE

## 2020-04-03 MED ORDER — SODIUM CHLORIDE 0.9 % IV BOLUS
500.0000 mL | Freq: Once | INTRAVENOUS | Status: AC
  Administered 2020-04-03: 500 mL via INTRAVENOUS

## 2020-04-03 MED ORDER — MORPHINE SULFATE (PF) 4 MG/ML IV SOLN
4.0000 mg | Freq: Once | INTRAVENOUS | Status: AC
  Administered 2020-04-03: 4 mg via INTRAVENOUS
  Filled 2020-04-03: qty 1

## 2020-04-03 MED ORDER — HYDROCODONE-ACETAMINOPHEN 5-325 MG PO TABS
1.0000 | ORAL_TABLET | ORAL | Status: DC | PRN
  Administered 2020-04-04: 2 via ORAL
  Filled 2020-04-03 (×3): qty 2

## 2020-04-03 MED ORDER — ONDANSETRON HCL 4 MG/2ML IJ SOLN
4.0000 mg | Freq: Once | INTRAMUSCULAR | Status: AC
  Administered 2020-04-03: 4 mg via INTRAVENOUS
  Filled 2020-04-03: qty 2

## 2020-04-03 MED ORDER — AMOXICILLIN-POT CLAVULANATE 875-125 MG PO TABS
1.0000 | ORAL_TABLET | Freq: Once | ORAL | Status: AC
  Administered 2020-04-03: 1 via ORAL
  Filled 2020-04-03: qty 1

## 2020-04-03 MED ORDER — HYDROCODONE-ACETAMINOPHEN 5-325 MG PO TABS: 1.0000 | ORAL_TABLET | Freq: Four times a day (QID) | ORAL | 0 refills | Status: DC | PRN

## 2020-04-03 MED ORDER — ENOXAPARIN SODIUM 40 MG/0.4ML ~~LOC~~ SOLN
40.0000 mg | SUBCUTANEOUS | Status: DC
  Administered 2020-04-04: 40 mg via SUBCUTANEOUS
  Filled 2020-04-03 (×2): qty 0.4

## 2020-04-03 MED ORDER — IOHEXOL 300 MG/ML  SOLN
100.0000 mL | Freq: Once | INTRAMUSCULAR | Status: AC | PRN
  Administered 2020-04-03: 100 mL via INTRAVENOUS
  Filled 2020-04-03: qty 100

## 2020-04-03 MED ORDER — ONDANSETRON HCL 4 MG/2ML IJ SOLN
4.0000 mg | Freq: Four times a day (QID) | INTRAMUSCULAR | Status: DC | PRN
  Administered 2020-04-04: 4 mg via INTRAVENOUS
  Filled 2020-04-03: qty 2

## 2020-04-03 MED ORDER — ACETAMINOPHEN 650 MG RE SUPP: 650.0000 mg | Freq: Four times a day (QID) | RECTAL | Status: DC | PRN

## 2020-04-03 MED ORDER — ONDANSETRON HCL 4 MG PO TABS: 4.0000 mg | ORAL_TABLET | Freq: Four times a day (QID) | ORAL | Status: DC | PRN

## 2020-04-03 MED ORDER — METRONIDAZOLE 500 MG PO TABS
500.0000 mg | ORAL_TABLET | Freq: Three times a day (TID) | ORAL | Status: DC
  Administered 2020-04-03 – 2020-04-04 (×2): 500 mg via ORAL
  Filled 2020-04-03 (×2): qty 1

## 2020-04-03 MED ORDER — ACETAMINOPHEN 325 MG PO TABS
650.0000 mg | ORAL_TABLET | Freq: Four times a day (QID) | ORAL | Status: DC | PRN
  Administered 2020-04-03 – 2020-04-04 (×2): 650 mg via ORAL
  Filled 2020-04-03 (×2): qty 2

## 2020-04-03 MED ORDER — AMOXICILLIN-POT CLAVULANATE 875-125 MG PO TABS: 1.0000 | ORAL_TABLET | Freq: Two times a day (BID) | ORAL | 0 refills | Status: DC

## 2020-04-03 MED ORDER — SODIUM CHLORIDE 0.9 % IV SOLN: INTRAVENOUS | Status: DC

## 2020-04-03 NOTE — ED Triage Notes (Signed)
Says she was here yesterday for same.  They gave her some pills, but no better.

## 2020-04-03 NOTE — H&P (Signed)
History and Physical    ADEOLA DENNEN XBD:[id number] DOB: [birth year] DOA: 04/03/2020  PCP: Marisue Ivan, MD   Patient coming from: Home  I have personally briefly reviewed patient's old medical records in [facility]  Chief Complaint: Abdominal pain  HPI: Alyssa Vega is a 64 y.o. female with medical history significant for chronic back pain and several month history of recurring abdominal pain scheduled for panendoscopy in October, who presents to the emergency room with acute worsening of abdominal pain in the past 24 hours.  She states that the pain suddenly worsened and was associated with nausea.  It is felt in the entire lower abdomen but worse on the left side.  She denies vomiting or diarrhea.  Denies fever or chills, dysuria or change in bowel habits.  Patient saw her PCP the day prior for an annual visit and was doing well at the time and her only complaint was for low back pain.  She said she was started on Cymbalta 30 mg, which was added to her usual Robaxin and gabapentin.  She stated that she took 1 dose last night but it made her feel very sick with tingling and nausea.  She had routine blood work as part of her physical exam at her doctor's office which were within normal limits.  ED Course: On arrival, vitals were within normal limits.  Blood work concerning for sodium of 120, down from 138 at her doctor's appointment the day prior.  On repeat sodium 124, potassium 3.3.  CT abdomen and pelvis showed mild colitis.  Hospitalist consulted for admission.  Review of Systems: As per HPI otherwise all other systems on review of systems negative.    Past Medical History:  Diagnosis Date  . Arthritis     Past Surgical History:  Procedure Laterality Date  . OVARIAN CYST REMOVAL       reports that she has never smoked. She has never used smokeless tobacco. She reports that she does not drink alcohol and does not use drugs.  Allergies  Allergen Reactions  .  Cyclobenzaprine   . Meloxicam     Family History  Problem Relation Age of Onset  . Breast cancer Neg Hx       Prior to Admission medications   Medication Sig Start Date End Date Taking? Authorizing Provider  ezetimibe (ZETIA) 10 MG tablet Take 10 mg by mouth daily. 01/14/20  Yes [provider]  gabapentin (NEURONTIN) 100 MG capsule Take 100 mg by mouth at bedtime. 01/14/20  Yes [provider]  methocarbamol (ROBAXIN) 500 MG tablet Take 500 mg by mouth every 8 (eight) hours as needed for muscle spasms.  03/23/20  Yes [provider]  omeprazole (PRILOSEC) 40 MG capsule Take 40 mg by mouth in the morning and at bedtime. 02/28/20  Yes [provider]  acetaminophen (TYLENOL) 500 MG chewable tablet Chew 500 mg by mouth every 6 (six) hours as needed for pain.    [provider]  albuterol (PROVENTIL HFA;VENTOLIN HFA) 108 (90 Base) MCG/ACT inhaler Inhale 2 puffs into the lungs every 6 (six) hours as needed for wheezing or shortness of breath. 07/11/18   Enid Derry, PA-C  amoxicillin-clavulanate (AUGMENTIN) 875-125 MG tablet Take 1 tablet by mouth 2 (two) times daily for 7 days. 04/03/20 04/10/20  Jene Every, MD  Ascorbic Acid (VITAMIN C) 1000 MG tablet Take 1,000 mg by mouth daily.    [provider]  b complex vitamins tablet Take 1 tablet  by mouth daily.    [provider]  calcium carbonate (OSCAL) 1500 (600 Ca) MG TABS tablet Take by mouth 2 (two) times daily with a meal.    [provider]  Cholecalciferol 1000 units TBDP Take by mouth.    [provider]  cyproheptadine (PERIACTIN) 4 MG tablet Take 1 tablet (4 mg total) by mouth 3 (three) times daily as needed for allergies. 08/05/16   Menshew, Charlesetta Ivory, PA-C  glucosamine-chondroitin 500-400 MG tablet Take 1 tablet by mouth 3 (three) times daily.    [provider]  HYDROcodone-acetaminophen (NORCO/VICODIN) 5-325 MG tablet Take 1 tablet by mouth  every 6 (six) hours as needed for severe pain. 04/03/20   Jene Every, MD  lovastatin (MEVACOR) 20 MG tablet Take 20 mg by mouth at bedtime.    [provider]    Physical Exam: Vitals:   04/03/20 1639 04/03/20 1644  BP: (!) 145/83   Pulse: 86   Resp: 19   Temp: 98.4 F (36.9 C)   SpO2: 96%   Weight:  65.8 kg  Height:  5' (1.524 m)     Vitals:   04/03/20 1639 04/03/20 1644  BP: (!) 145/83   Pulse: 86   Resp: 19   Temp: 98.4 F (36.9 C)   SpO2: 96%   Weight:  65.8 kg  Height:  5' (1.524 m)      Constitutional: Alert and oriented x 3 . Not in any apparent distress HEENT:      Head: Normocephalic and atraumatic.         Eyes: PERLA, EOMI, Conjunctivae are normal. Sclera is non-icteric.       Mouth/Throat: Mucous membranes are moist.       Neck: Supple with no signs of meningismus. Cardiovascular: Regular rate and rhythm. No murmurs, gallops, or rubs. 2+ symmetrical distal pulses are present . No JVD. No LE edema Respiratory: Respiratory effort normal .Lungs sounds clear bilaterally. No wheezes, crackles, or rhonchi.  Gastrointestinal: Soft, mild tenderness left lower quadrant, and non distended with positive bowel sounds. No rebound or guarding. Genitourinary: No CVA tenderness. Musculoskeletal: Nontender with normal range of motion in all extremities. No cyanosis, or erythema of extremities. Neurologic: Normal speech and language. Face is symmetric. Moving all extremities. No gross focal neurologic deficits . Skin: Skin is warm, dry.  No rash or ulcers Psychiatric: Mood and affect are normal Speech and behavior are normal   Labs on Admission: I have personally reviewed following labs and imaging studies  CBC: Recent Labs  Lab 04/03/20 1702  WBC 6.8  HGB 13.9  HCT 38.4  MCV 82.8  PLT 247   Basic Metabolic Panel: Recent Labs  Lab 04/03/20 1702 04/03/20 2014  NA 120* 124*  K 3.1* 3.3*  CL 90* 93*  CO2 19* 21*  GLUCOSE 110* 104*  BUN 7* 6*   CREATININE 0.37* 0.45  CALCIUM 9.0 8.7*   GFR: Estimated Creatinine Clearance: 59.3 mL/min (by C-G formula based on SCr of 0.45 mg/dL). Liver Function Tests: Recent Labs  Lab 04/03/20 1702  AST 28  ALT 22  ALKPHOS 67  BILITOT 0.7  PROT 7.2  ALBUMIN 4.2   Recent Labs  Lab 04/03/20 1702  LIPASE 34   No results for input(s): AMMONIA in the last 168 hours. Coagulation Profile: No results for input(s): INR, PROTIME in the last 168 hours. Cardiac Enzymes: No results for input(s): CKTOTAL, CKMB, CKMBINDEX, TROPONINI in the last 168 hours. BNP (last 3 results)  No results for input(s): PROBNP in the last 8760 hours. HbA1C: No results for input(s): HGBA1C in the last 72 hours. CBG: No results for input(s): GLUCAP in the last 168 hours. Lipid Profile: No results for input(s): CHOL, HDL, LDLCALC, TRIG, CHOLHDL, LDLDIRECT in the last 72 hours. Thyroid Function Tests: No results for input(s): TSH, T4TOTAL, FREET4, T3FREE, THYROIDAB in the last 72 hours. Anemia Panel: No results for input(s): VITAMINB12, FOLATE, FERRITIN, TIBC, IRON, RETICCTPCT in the last 72 hours. Urine analysis:    Component Value Date/Time   COLORURINE YELLOW (A) 04/03/2020 1702   APPEARANCEUR CLEAR (A) 04/03/2020 1702   LABSPEC 1.018 04/03/2020 1702   PHURINE 7.0 04/03/2020 1702   GLUCOSEU NEGATIVE 04/03/2020 1702   HGBUR MODERATE (A) 04/03/2020 1702   BILIRUBINUR NEGATIVE 04/03/2020 1702   KETONESUR 5 (A) 04/03/2020 1702   PROTEINUR NEGATIVE 04/03/2020 1702   NITRITE NEGATIVE 04/03/2020 1702   LEUKOCYTESUR NEGATIVE 04/03/2020 1702    Radiological Exams on Admission: CT ABDOMEN PELVIS W CONTRAST  Result Date: 04/03/2020 CLINICAL DATA:  Left lower quadrant abdominal pain radiating to left flank EXAM: CT ABDOMEN AND PELVIS WITH CONTRAST TECHNIQUE: Multidetector CT imaging of the abdomen and pelvis was performed using the standard protocol following bolus administration of intravenous contrast. CONTRAST:   OMNIPAQUE IOHEXOL 300 MG/ML  SOLN COMPARISON:  Hepatobiliary scan 09/16/2019, ultrasound 09/16/2018 FINDINGS: Lower chest: 8 mm grounds 5 granuloma seen in the left lung base. Bandlike area of scarring in the medial left base as well. Lung bases are otherwise clear. Normal heart size. No pericardial effusion. Hepatobiliary: There is some geographic hypoattenuation along the falciform ligament in the location quite typical for focal fatty infiltration. Small hypoattenuating subcentimeter foci in the liver (2/31, 24) too small to fully characterize on CT imaging but statistically likely benign. No concerning focal liver lesions. Smooth liver surface contour. Normal gallbladder and biliary tree. Pancreas: Unremarkable. No pancreatic ductal dilatation or surrounding inflammatory changes. Spleen: Normal in size. No concerning splenic lesions. Adrenals/Urinary Tract: Normal adrenal glands. Kidneys are normally located with symmetric enhancement and excretion. No suspicious renal lesion, urolithiasis or hydronephrosis. Urinary bladder is unremarkable. Stomach/Bowel: Esophagus, stomach and duodenum are free of acute abnormality. No small bowel thickening or dilatation. A normal appendix is visualized. Question some mild colonic thickening versus underdistention from the mid transverse through rectosigmoid colon. Faint pericolonic haze is suspected. Scattered colonic diverticula without focal inflammation to suggest diverticulitis. No evidence of bowel obstruction Vascular/Lymphatic: Atherosclerotic calcifications within the abdominal aorta and branch vessels. No aneurysm or ectasia. No enlarged abdominopelvic lymph nodes. Reproductive: Anteverted uterus. Rounded hypodense focus towards the level of the cervix measuring up to 11 mm could reflect a small nabothian cyst. No worrisome adnexal lesions. Other: No abdominopelvic free fluid or free gas. No bowel containing hernias. Small fat containing umbilical hernia. Few  calcified injection granulomata posteriorly. Musculoskeletal: Levocurvature of the lumbar spine, apex L3-4. Multilevel degenerative changes are present in the imaged portions of the spine. No acute osseous abnormality or suspicious osseous lesion. IMPRESSION: 1. Questionable colonic thickening versus underdistention from the mid transverse through rectosigmoid colon with faint pericolonic haze, suggestive of mild colitis. Correlate with exam findings. 2. Colonic diverticulosis without evidence of acute diverticulitis. 3. Rounded hypodense focus towards the level of the cervix measuring up to 11 mm could reflect a small nabothian cyst. 4. Aortic Atherosclerosis (ICD10-I70.0). Electronically Signed   By: Kreg Shropshire M.D.   On: 04/03/2020 19:43    EKG: Independently reviewed. Interpretation :  Assessment/Plan 64 year old [id number] with history of chronic back pain presenting with acute on chronic abdominal pain with finding of hyponatremia and colitis on CT    Hyponatremia -Sodium 120 on arrival, down from 138 the day prior -Etiology uncertain.  Patient denies  vomiting or diarrhea associated with her abdominal pain but did state that she was started on new medication that made her feel unwell.  Denies headache, brain fog or visual disturbance -Doubtful that a precipitous change in sodium has to do with single first dose of duloxetine taken the day prior -Follow urine and sodium osmolality and sodium urine -IV hydration with normal saline -Serial sodium -Consult nephrologist in the a.m. if needed  Acute colitis -Patient presenting with acute on chronic abdominal pain with finding of mild colitis on CT abdomen and pelvis but without change in bowel habits -Flagyl 500 3 times daily -Pain management -Patient has outpatient appointment in October for upper and lower endoscopy -States has been treated for H. pylori in the past  Chronic back pain -Continue gabapentin and Robaxin -Hold recently  started Cymbalta which she stated she did not tolerate    DVT prophylaxis: Lovenox  Code Status: full code  Family Communication:  none  Disposition Plan: Back to previous home environment Consults called: none  Status:At the time of admission, it appears that the appropriate admission status for this patient is INPATIENT. This is judged to be reasonable and necessary in order to provide the required intensity of service to ensure the patient's safety given the presenting symptoms, physical exam findings, and initial radiographic and laboratory data in the context of their  Comorbid conditions.   Patient requires inpatient status due to high intensity of service, high risk for further deterioration and high frequency of surveillance required.   I certify that at the point of admission it is my clinical judgment that the patient will require inpatient hospital care spanning beyond 2 midnights     Andris BaumannHazel V Emsley Custer MD [facility]     04/03/2020, 10:52 PM

## 2020-04-03 NOTE — ED Provider Notes (Signed)
-----------------------------------------   9:14 PM on 04/03/2020 -----------------------------------------  Repeat lab work performed shows sodium remains at 124 despite fluids.  We will discussed with the hospitalist for admission.   Minna Antis, MD 04/03/20 2114

## 2020-04-03 NOTE — ED Notes (Signed)
Patient ambulated with steady gait to hallway restroom. 

## 2020-04-03 NOTE — ED Provider Notes (Signed)
[facility] Emergency Department Provider Note   ____________________________________________    I have reviewed the triage vital signs and the nursing notes.   HISTORY  Chief Complaint Abdominal Pain  Spanish interpreter used.   HPI Alyssa Vega is a 64 y.o. female who presents with complaints of abdominal pain.  She also complains of back cramping which she reports is chronic for her.  Over the last 2 days though she has developed lower abdominal pain which at times has been severe, it is bilateral.  She has not taken anything for this.  No fevers or chills.  No nausea or vomiting.  No history of abdominal surgery.  No sick contacts reported.  Has received Covid vaccines.  Past Medical History:  Diagnosis Date  . Arthritis     There are no problems to display for this patient.   Past Surgical History:  Procedure Laterality Date  . OVARIAN CYST REMOVAL      Prior to Admission medications   Medication Sig Start Date End Date Taking? Authorizing Provider  acetaminophen (TYLENOL) 500 MG chewable tablet Chew 500 mg by mouth every 6 (six) hours as needed for pain.    [provider]  albuterol (PROVENTIL HFA;VENTOLIN HFA) 108 (90 Base) MCG/ACT inhaler Inhale 2 puffs into the lungs every 6 (six) hours as needed for wheezing or shortness of breath. 07/11/18   Enid Derry, PA-C  Ascorbic Acid (VITAMIN C) 1000 MG tablet Take 1,000 mg by mouth daily.    [provider]  b complex vitamins tablet Take 1 tablet by mouth daily.    [provider]  calcium carbonate (OSCAL) 1500 (600 Ca) MG TABS tablet Take by mouth 2 (two) times daily with a meal.    [provider]  Cholecalciferol 1000 units TBDP Take by mouth.    [provider]  cyproheptadine (PERIACTIN) 4 MG tablet Take 1 tablet (4 mg total) by mouth 3 (three) times daily as needed for allergies. 08/05/16   Menshew, Charlesetta Ivory, PA-C   glucosamine-chondroitin 500-400 MG tablet Take 1 tablet by mouth 3 (three) times daily.    [provider]  lovastatin (MEVACOR) 20 MG tablet Take 20 mg by mouth at bedtime.    [provider]  predniSONE (DELTASONE) 20 MG tablet Take 1 tablet (20 mg total) by mouth daily with breakfast. 08/05/16   Menshew, Charlesetta Ivory, PA-C  ranitidine (ZANTAC) 150 MG tablet Take 1 tablet (150 mg total) by mouth 2 (two) times daily. 08/05/16   Menshew, Charlesetta Ivory, PA-C  silver sulfADIAZINE (SILVADENE) 1 % cream Apply to affected area daily 08/05/16   Menshew, Charlesetta Ivory, PA-C     Allergies Cyclobenzaprine and Meloxicam  Family History  Problem Relation Age of Onset  . Breast cancer Neg Hx     Social History Social History   Tobacco Use  . Smoking status: Never Smoker  . Smokeless tobacco: Never Used  Substance Use Topics  . Alcohol use: No  . Drug use: No    Review of Systems  Constitutional: No fever/chills Eyes: No visual changes.  ENT: No sore throat. Cardiovascular: Denies chest pain. Respiratory: Denies shortness of breath. Gastrointestinal: As above Genitourinary: Negative for dysuria. Musculoskeletal: As above Skin: Negative for rash. Neurological: Negative for headaches or weakness   ____________________________________________   PHYSICAL EXAM:  VITAL SIGNS: ED Triage Vitals  Enc Vitals Group     BP 04/03/20 1639 (!) 145/83  Pulse Rate 04/03/20 1639 86     Resp 04/03/20 1639 19     Temp 04/03/20 1639 98.4 F (36.9 C)     Temp src --      SpO2 04/03/20 1639 96 %     Weight 04/03/20 1644 65.8 kg (145 lb)     Height 04/03/20 1644 1.524 m (5')     Head Circumference --      Peak Flow --      Pain Score 04/03/20 1643 10     Pain Loc --      Pain Edu? --      Excl. in GC? --     Constitutional: Alert and oriented.  Eyes: Conjunctivae are normal.  Head: Atraumatic. Nose: No congestion/rhinnorhea. Mouth/Throat: Mucous membranes are  moist.    Cardiovascular: Normal rate, regular rhythm. Grossly normal heart sounds.  Good peripheral circulation. Respiratory: Normal respiratory effort.  No retractions. Lungs CTAB. Gastrointestinal: Mild tenderness to the right lower quadrant left lower quadrant, no distention.  No CVA tenderness. Genitourinary: deferred Musculoskeletal: No lower extremity tenderness nor edema.  Warm and well perfused Neurologic:  Normal speech and language. No gross focal neurologic deficits are appreciated.  Skin:  Skin is warm, dry and intact. No rash noted. Psychiatric: Mood and affect are normal. Speech and behavior are normal.  ____________________________________________   LABS (all labs ordered are listed, but only abnormal results are displayed)  Labs Reviewed  COMPREHENSIVE METABOLIC PANEL - Abnormal; Notable for the following components:      Result Value   Sodium 120 (*)    Potassium 3.1 (*)    Chloride 90 (*)    CO2 19 (*)    Glucose, Bld 110 (*)    BUN 7 (*)    Creatinine, Ser 0.37 (*)    All other components within normal limits  CBC - Abnormal; Notable for the following components:   MCHC 36.2 (*)    All other components within normal limits  URINALYSIS, COMPLETE (UACMP) WITH MICROSCOPIC - Abnormal; Notable for the following components:   Color, Urine YELLOW (*)    APPearance CLEAR (*)    Hgb urine dipstick MODERATE (*)    Ketones, ur 5 (*)    All other components within normal limits  SARS CORONAVIRUS 2 BY RT PCR (HOSPITAL ORDER, PERFORMED IN [facility])  LIPASE, BLOOD  BASIC METABOLIC PANEL   ____________________________________________  EKG  ED ECG REPORT I, Jene Every, the attending physician, personally viewed and interpreted this ECG.  Date: 04/03/2020  Rhythm: normal sinus rhythm QRS Axis: normal Intervals: normal ST/T Wave abnormalities: normal Narrative Interpretation: no evidence of acute  ischemia  ____________________________________________  RADIOLOGY  CT abdomen pelvis demonstrates possible mild colitis ____________________________________________   PROCEDURES  Procedure(s) performed: No  Procedures   Critical Care performed: No ____________________________________________   INITIAL IMPRESSION / ASSESSMENT AND PLAN / ED COURSE  Pertinent labs & imaging results that were available during my care of the patient were reviewed by me and considered in my medical decision making (see chart for details).  Patient presents with abdominal pain as described above, also complains of chronic back pain which she does not think is related to her presentation today.  Is not take anything for this besides muscle relaxant which was prescribed for her yesterday.  Differential includes diverticulitis, colitis, urinary tract infection  Lab work is notable for sodium of 120 which is significantly abnormal, will obtain CT abdomen pelvis to evaluate abdominal pain.  Urinalysis is reassuring, white blood cell count is normal.  We will treat with IV morphine, IV Zofran.  CT abdomen pelvis reviewed by me, no acute abnormalities noted.  Pending radiology review.  Radiology notes possible mild colitis otherwise no acute abnormality on CT scan.  Dr. Para March of hospital service noted that patient had lab drawn yesterday via care everywhere which demonstrated a sodium of 138 which makes our result of 120 today highly suggestive of lab error.  We will redraw.    ____________________________________________   FINAL CLINICAL IMPRESSION(S) / ED DIAGNOSES  Final diagnoses:  Lower abdominal pain  Colitis        Note:  This document was prepared using Dragon voice recognition software and may include unintentional dictation errors.   Jene Every, MD 04/03/20 2016

## 2020-04-04 DIAGNOSIS — R1084 Generalized abdominal pain: Secondary | ICD-10-CM

## 2020-04-04 LAB — CBC
HCT: 35.7 % — ABNORMAL LOW (ref 36.0–46.0)
Hemoglobin: 13.3 g/dL (ref 12.0–15.0)
MCH: 30 pg (ref 26.0–34.0)
MCHC: 37.3 g/dL — ABNORMAL HIGH (ref 30.0–36.0)
MCV: 80.6 fL (ref 80.0–100.0)
Platelets: 237 10*3/uL (ref 150–400)
RBC: 4.43 MIL/uL (ref 3.87–5.11)
RDW: 13.1 % (ref 11.5–15.5)
WBC: 5.8 10*3/uL (ref 4.0–10.5)
nRBC: 0 % (ref 0.0–0.2)

## 2020-04-04 LAB — BASIC METABOLIC PANEL
Anion gap: 10 (ref 5–15)
BUN: 7 mg/dL — ABNORMAL LOW (ref 8–23)
CO2: 19 mmol/L — ABNORMAL LOW (ref 22–32)
Calcium: 8.8 mg/dL — ABNORMAL LOW (ref 8.9–10.3)
Chloride: 93 mmol/L — ABNORMAL LOW (ref 98–111)
Creatinine, Ser: 0.55 mg/dL (ref 0.44–1.00)
GFR calc Af Amer: >60 mL/min (ref >60)
GFR calc non Af Amer: >60 mL/min (ref >60)
Glucose, Bld: 131 mg/dL — ABNORMAL HIGH (ref 70–99)
Potassium: 3.1 mmol/L — ABNORMAL LOW (ref 3.5–5.1)
Sodium: 122 mmol/L — ABNORMAL LOW (ref 135–145)

## 2020-04-04 LAB — SODIUM
Sodium: 123 mmol/L — ABNORMAL LOW (ref 135–145)
Sodium: 122 mmol/L — ABNORMAL LOW (ref 135–145)
Sodium: 127 mmol/L — ABNORMAL LOW (ref 135–145)

## 2020-04-04 LAB — CREATININE, SERUM
Creatinine, Ser: 0.41 mg/dL — ABNORMAL LOW (ref 0.44–1.00)
GFR calc Af Amer: >60 mL/min (ref >60)
GFR calc non Af Amer: >60 mL/min (ref >60)

## 2020-04-04 LAB — OSMOLALITY: Osmolality: 261 mOsm/kg — ABNORMAL LOW (ref 275–295)

## 2020-04-04 LAB — TSH: TSH: 4.189 u[IU]/mL (ref 0.350–4.500)

## 2020-04-04 LAB — OSMOLALITY, URINE: Osmolality, Ur: 573 mOsm/kg (ref 300–900)

## 2020-04-04 LAB — HIV ANTIBODY (ROUTINE TESTING W REFLEX): HIV Screen 4th Generation wRfx: NONREACTIVE

## 2020-04-04 LAB — MAGNESIUM: Magnesium: 2 mg/dL (ref 1.7–2.4)

## 2020-04-04 LAB — SODIUM, URINE, RANDOM: Sodium, Ur: 119 mmol/L

## 2020-04-04 MED ORDER — OXYCODONE HCL 5 MG PO TABS
5.0000 mg | ORAL_TABLET | Freq: Four times a day (QID) | ORAL | Status: DC | PRN
  Administered 2020-04-04: 5 mg via ORAL
  Filled 2020-04-04: qty 1

## 2020-04-04 MED ORDER — POTASSIUM CHLORIDE CRYS ER 20 MEQ PO TBCR
20.0000 meq | EXTENDED_RELEASE_TABLET | Freq: Two times a day (BID) | ORAL | Status: AC
  Administered 2020-04-04 (×2): 20 meq via ORAL
  Filled 2020-04-04 (×2): qty 1

## 2020-04-04 MED ORDER — PANTOPRAZOLE SODIUM 40 MG PO TBEC
40.0000 mg | DELAYED_RELEASE_TABLET | Freq: Every day | ORAL | Status: DC
  Administered 2020-04-04 – 2020-04-05 (×3): 40 mg via ORAL
  Filled 2020-04-04 (×3): qty 1

## 2020-04-04 MED ORDER — COSYNTROPIN 0.25 MG IJ SOLR
0.2500 mg | Freq: Once | INTRAMUSCULAR | Status: AC
  Administered 2020-04-05: 0.25 mg via INTRAVENOUS
  Filled 2020-04-04 (×2): qty 0.25

## 2020-04-04 NOTE — ED Notes (Signed)
Pt refusing to lie in bed for seizure precautions and be connected to any monitoring. Pt pacing floor in room.

## 2020-04-04 NOTE — ED Notes (Signed)
Patient able to walk self to bathroom without complication. Pt remains A&O x4.  Pt c/o back pain from lying in stretcher. Refused recliner chair and stronger pain medication.

## 2020-04-04 NOTE — Consult Note (Signed)
[facility]  CONSULT NOTE    Date: 04/04/2020                  Patient Name:  Alyssa Vega  MRN: [id number]  DOB: [birth year]  Age / Sex: [age] y.o., female         PCP: Marisue IvanLinthavong, [place], MD                 Service Requesting Consult: Dr. Ashok PallWouk                 Reason for Consult: Hyponatremia             History of Present Illness: Ms. Alyssa FriskRebeca A Vega admitted to [facility] for hyponatremia. Patient was started on duloxetine Thursday night. This caused her to have nausea, tingling and sick. She was then found to have a serum sodium of 120. The day prior, her sodium was 138.   Patient denies any diarrhea, poor appetite. She does not use diuretics.    Medications: Outpatient medications: (Not in a hospital admission)   Current medications: Current Facility-Administered Medications  Medication Dose Route Frequency Provider Last Rate Last Admin  . acetaminophen (TYLENOL) tablet 650 mg  650 mg Oral Q6H PRN Andris Baumannuncan, Hazel V, MD   650 mg at 04/04/20 [id number]   Or  . acetaminophen (TYLENOL) suppository 650 mg  650 mg Rectal Q6H PRN Andris Baumannuncan, Hazel V, MD      . Melene Muller[START ON 04/05/2020] cosyntropin (CORTROSYN) injection 0.25 mg  0.25 mg Intravenous Once Wouk, Wilfred CurtisNoah Bedford, MD      . enoxaparin (LOVENOX) injection 40 mg  40 mg Subcutaneous Q24H Andris Baumannuncan, Hazel V, MD   40 mg at 04/04/20 1109  . HYDROcodone-acetaminophen (NORCO/VICODIN) 5-325 MG per tablet 1-2 tablet  1-2 tablet Oral Q4H PRN Andris Baumannuncan, Hazel V, MD   2 tablet at 04/04/20 1107  . metroNIDAZOLE (FLAGYL) tablet 500 mg  500 mg Oral Q8H Andris Baumannuncan, Hazel V, MD   500 mg at 04/04/20 0629  . ondansetron (ZOFRAN) tablet 4 mg  4 mg Oral Q6H PRN Andris Baumannuncan, Hazel V, MD       Or  . ondansetron [facility](ZOFRAN) injection 4 mg  4 mg Intravenous Q6H PRN Andris Baumannuncan, Hazel V, MD      . oxyCODONE (Oxy IR/ROXICODONE) immediate release tablet 5 mg  5 mg Oral Q6H PRN Wouk, Wilfred CurtisNoah Bedford, MD       Current Outpatient Medications  Medication Sig Dispense Refill   . gabapentin (NEURONTIN) 100 MG capsule Take 100 mg by mouth at bedtime.    . methocarbamol (ROBAXIN) 500 MG tablet Take 500 mg by mouth every 8 (eight) hours as needed for muscle spasms.     Marland Kitchen. omeprazole (PRILOSEC) 40 MG capsule Take 40 mg by mouth in the morning and at bedtime.    Marland Kitchen. amoxicillin-clavulanate (AUGMENTIN) 875-125 MG tablet Take 1 tablet by mouth 2 (two) times daily for 7 days. 14 tablet 0  . HYDROcodone-acetaminophen (NORCO/VICODIN) 5-325 MG tablet Take 1 tablet by mouth every 6 (six) hours as needed for severe pain. 20 tablet 0      Allergies: Allergies  Allergen Reactions  . Atorvastatin Itching  . Cyclobenzaprine Other (See Comments)    Sores in mouth/throat  . Ibuprofen Other (See Comments)    Mouth sores  . Melatonin Other (See Comments)    Stomach pain  . Meloxicam Other (See Comments)    Sores/ulcerations mouth/tongue/throat  . Tizanidine Other (See Comments)    Nightmares,  insomnia   . Venlafaxine Other (See Comments)    Did not feel well at all, very lethargic       Past Medical History: Past Medical History:  Diagnosis Date  . Arthritis      Past Surgical History: Past Surgical History:  Procedure Laterality Date  . OVARIAN CYST REMOVAL       Family History: Family History  Problem Relation Age of Onset  . Breast cancer Neg Hx      Social History: Social History   Socioeconomic History  . Marital status: Divorced    Spouse name: Not on file  . Number of children: Not on file  . Years of education: Not on file  . Highest education level: Not on file  Occupational History  . Not on file  Tobacco Use  . Smoking status: Never Smoker  . Smokeless tobacco: Never Used  Substance and Sexual Activity  . Alcohol use: No  . Drug use: No  . Sexual activity: Not on file  Other Topics Concern  . Not on file  Social History Narrative  . Not on file   Social Determinants of Health   Financial Resource Strain:   . Difficulty of  Paying Living Expenses: Not on file  Food Insecurity:   . Worried About Programme researcher, broadcasting/film/video in the Last Year: Not on file  . Ran Out of Food in the Last Year: Not on file  Transportation Needs:   . Lack of Transportation (Medical): Not on file  . Lack of Transportation (Non-Medical): Not on file  Physical Activity:   . Days of Exercise per Week: Not on file  . Minutes of Exercise per Session: Not on file  Stress:   . Feeling of Stress : Not on file  Social Connections:   . Frequency of Communication with Friends and Family: Not on file  . Frequency of Social Gatherings with Friends and Family: Not on file  . Attends Religious Services: Not on file  . Active Member of Clubs or Organizations: Not on file  . Attends Banker Meetings: Not on file  . Marital Status: Not on file  Intimate Partner Violence:   . Fear of Current or Ex-Partner: Not on file  . Emotionally Abused: Not on file  . Physically Abused: Not on file  . Sexually Abused: Not on file     Review of Systems: Review of Systems  Constitutional: Negative.   HENT: Negative.   Eyes: Negative.   Respiratory: Negative for cough, hemoptysis, sputum production, shortness of breath and wheezing.   Cardiovascular: Negative for chest pain, palpitations, orthopnea, claudication, leg swelling and PND.  Gastrointestinal: Negative.   Genitourinary: Negative for dysuria, flank pain, frequency, hematuria and urgency.  Musculoskeletal: Negative for back pain, falls, joint pain, myalgias and neck pain.  Skin: Negative.   Neurological: Negative.   Endo/Heme/Allergies: Negative.   Psychiatric/Behavioral: Negative.     Vital Signs: Blood pressure (!) 141/80, pulse 63, temperature 98.4 F (36.9 C), resp. rate 18, height 5' (1.524 m), weight 65.8 kg, SpO2 98 %.  Weight trends: Filed Weights   04/03/20 1644  Weight: 65.8 kg    Physical Exam: General: NAD,   Head: Normocephalic, atraumatic. Moist oral mucosal  membranes  Eyes: Anicteric, PERRL  Neck: Supple, trachea midline  Lungs:  Clear to auscultation  Heart: Regular rate and rhythm  Abdomen:  Soft, nontender,   Extremities: no peripheral edema.  Neurologic: Nonfocal, moving all four extremities  Skin: No  lesions         Lab results: Basic Metabolic Panel: Recent Labs  Lab 04/03/20 1702 04/03/20 1702 04/03/20 2014 04/04/20 0258 04/04/20 1110  NA 120*   < > 124* 123* 122*  122*  K 3.1*  --  3.3*  --  3.1*  CL 90*  --  93*  --  93*  CO2 19*  --  21*  --  19*  GLUCOSE 110*  --  104*  --  131*  BUN 7*  --  6*  --  7*  CREATININE 0.37*   < > 0.45 0.41* 0.55  CALCIUM 9.0  --  8.7*  --  8.8*  MG  --   --   --  2.0  --    < > = values in this interval not displayed.    Liver Function Tests: Recent Labs  Lab 04/03/20 1702  AST 28  ALT 22  ALKPHOS 67  BILITOT 0.7  PROT 7.2  ALBUMIN 4.2   Recent Labs  Lab 04/03/20 1702  LIPASE 34   No results for input(s): AMMONIA in the last 168 hours.  CBC: Recent Labs  Lab 04/03/20 1702 04/04/20 0258  WBC 6.8 5.8  HGB 13.9 13.3  HCT 38.4 35.7*  MCV 82.8 80.6  PLT 247 237    Cardiac Enzymes: No results for input(s): CKTOTAL, CKMB, CKMBINDEX, TROPONINI in the last 168 hours.  BNP: Invalid input(s): POCBNP  CBG: No results for input(s): GLUCAP in the last 168 hours.  Microbiology: Results for orders placed or performed during the hospital encounter of 04/03/20  SARS Coronavirus 2 by RT PCR (hospital order, performed in [facility] hospital lab) Nasopharyngeal Nasopharyngeal Swab     Status: None   Collection Time: 04/03/20  7:17 PM   Specimen: Nasopharyngeal Swab  Result Value Ref Range Status   SARS Coronavirus 2 NEGATIVE NEGATIVE Final    Comment: (NOTE) SARS-CoV-2 target nucleic acids are NOT DETECTED.  The SARS-CoV-2 RNA is generally detectable in upper and lower respiratory specimens during the acute phase of infection. The lowest concentration of  SARS-CoV-2 viral copies this assay can detect is 250 copies / mL. A negative result does not preclude SARS-CoV-2 infection and should not be used as the sole basis for treatment or other patient management decisions.  A negative result may occur with improper specimen collection / handling, submission of specimen other than nasopharyngeal swab, presence of viral mutation(s) within the areas targeted by this assay, and inadequate number of viral copies (<250 copies / mL). A negative result must be combined with clinical observations, patient history, and epidemiological information.  Fact Sheet for Patients:   BoilerBrush.com.cy  Fact Sheet for Healthcare Providers: https://pope.com/  This test is not yet approved or  cleared by the [place] FDA and has been authorized for detection and/or diagnosis of SARS-CoV-2 by FDA under an Emergency Use Authorization (EUA).  This EUA will remain in effect (meaning this test can be used) for the duration of the COVID-19 declaration under Section 564(b)(1) of the Act, 21 U.S.C. section 360bbb-3(b)(1), unless the authorization is terminated or revoked sooner.  Performed at [facility], [address] [place], [place] [address]     Coagulation Studies: No results for input(s): LABPROT, INR in the last 72 hours.  Urinalysis: Recent Labs    04/03/20 1702  COLORURINE YELLOW*  LABSPEC 1.018  PHURINE 7.0  GLUCOSEU NEGATIVE  HGBUR MODERATE*  BILIRUBINUR NEGATIVE  KETONESUR 5*  PROTEINUR NEGATIVE  NITRITE NEGATIVE  LEUKOCYTESUR NEGATIVE      Imaging: CT ABDOMEN PELVIS W CONTRAST  Result Date: 04/03/2020 CLINICAL DATA:  Left lower quadrant abdominal pain radiating to left flank EXAM: CT ABDOMEN AND PELVIS WITH CONTRAST TECHNIQUE: Multidetector CT imaging of the abdomen and pelvis was performed using the standard protocol following bolus administration of intravenous contrast.  CONTRAST:  OMNIPAQUE IOHEXOL 300 MG/ML  SOLN COMPARISON:  Hepatobiliary scan 09/16/2019, ultrasound 09/16/2018 FINDINGS: Lower chest: 8 mm grounds 5 granuloma seen in the left lung base. Bandlike area of scarring in the medial left base as well. Lung bases are otherwise clear. Normal heart size. No pericardial effusion. Hepatobiliary: There is some geographic hypoattenuation along the falciform ligament in the location quite typical for focal fatty infiltration. Small hypoattenuating subcentimeter foci in the liver (2/31, 24) too small to fully characterize on CT imaging but statistically likely benign. No concerning focal liver lesions. Smooth liver surface contour. Normal gallbladder and biliary tree. Pancreas: Unremarkable. No pancreatic ductal dilatation or surrounding inflammatory changes. Spleen: Normal in size. No concerning splenic lesions. Adrenals/Urinary Tract: Normal adrenal glands. Kidneys are normally located with symmetric enhancement and excretion. No suspicious renal lesion, urolithiasis or hydronephrosis. Urinary bladder is unremarkable. Stomach/Bowel: Esophagus, stomach and duodenum are free of acute abnormality. No small bowel thickening or dilatation. A normal appendix is visualized. Question some mild colonic thickening versus underdistention from the mid transverse through rectosigmoid colon. Faint pericolonic haze is suspected. Scattered colonic diverticula without focal inflammation to suggest diverticulitis. No evidence of bowel obstruction Vascular/Lymphatic: Atherosclerotic calcifications within the abdominal aorta and branch vessels. No aneurysm or ectasia. No enlarged abdominopelvic lymph nodes. Reproductive: Anteverted uterus. Rounded hypodense focus towards the level of the cervix measuring up to 11 mm could reflect a small nabothian cyst. No worrisome adnexal lesions. Other: No abdominopelvic free fluid or free gas. No bowel containing hernias. Small fat containing umbilical  hernia. Few calcified injection granulomata posteriorly. Musculoskeletal: Levocurvature of the lumbar spine, apex L3-4. Multilevel degenerative changes are present in the imaged portions of the spine. No acute osseous abnormality or suspicious osseous lesion. IMPRESSION: 1. Questionable colonic thickening versus underdistention from the mid transverse through rectosigmoid colon with faint pericolonic haze, suggestive of mild colitis. Correlate with exam findings. 2. Colonic diverticulosis without evidence of acute diverticulitis. 3. Rounded hypodense focus towards the level of the cervix measuring up to 11 mm could reflect a small nabothian cyst. 4. Aortic Atherosclerosis (ICD10-I70.0). Electronically Signed   By: Kreg Shropshire M.D.   On: 04/03/2020 19:43      Assessment & Plan: Ms. AMERE IOTT is a 64 y.o. Hispanic female with arthritis, GERD, lower back pain and abdominal pain, who was admitted to [facility] on 04/03/2020 for Hyponatremia [E87.1]   1. Hyponatremia: acute. Sodium was 138 on 9/16. On admission was 120 and then was given IV normal saline.   2. Hypokalemia  3. Hypertension: 141/80. No history of essential hypertension.   - currently with work up for adrenal insufficiency. If sodium does not improve, start hypertonic saline versus tolvaptan.   LOS: 1 Zalan Shidler 9/18/20212:15 PM

## 2020-04-04 NOTE — ED Notes (Signed)
Informed April RN of bed assignment

## 2020-04-04 NOTE — ED Notes (Signed)
Got PT a glass of water. No other needs at this time.

## 2020-04-04 NOTE — ED Notes (Signed)
Hospital bed has been requested.

## 2020-04-04 NOTE — Consult Note (Addendum)
Midge Minium, MD [facility]  [address] [place], [place] [address] Phone: [phone] Fax : [phone]  Consultation  Referring Provider:     Dr. Para March Primary Care Physician:  Marisue Ivan, MD Primary Gastroenterologist:  Dr. Norma Fredrickson         Reason for Consultation:     Abdominal pain  Date of Admission:  04/03/2020 Date of Consultation:  04/04/2020         HPI:   Alyssa Vega is a 64 y.o. female who has been seeing gastroenterology at [facility] since 2017.  The patient was recommended to have a colonoscopy back in 2017 but after being set up for it she canceled it.  The patient was then seen for abdominal pain by Dr. Norma Fredrickson in October 2020 and has had no less than 20 interactions with them including multiple office visits and telephone conversations about her abdominal pain.  The patient was seen just 17 days ago in the GI office and a upper endoscopy and colonoscopy were planned for this chronic abdominal pain.  The patient previously had been set up for a upper endoscopy in March but like the colonoscopy she canceled it at the last minute and did not go through with the upper endoscopy or colonoscopy and that we have set up for her the past.  The patient had a gallbladder ultrasound with a full bladder emptying study that was negative.   The patient reports that she has diarrhea typically twice a year.  She does not report that the abdominal pain which she states is throughout her entire abdomen gets better or worse with eating or drinking.  She also states that it is not better with bowel movements.  There is nothing that she can say that makes it any better or worse.  She also has chronic back pain in addition to her abdominal pain.  She now reports that her abdominal pain has been worse over the last 3 to 4 months and much more noticeable over the last 2 months.  The patient was not feeling well and had continued abdominal pain so she came to the emergency  department.  In the emergency department she was found to have a CT scan showing:  IMPRESSION: 1. Questionable colonic thickening versus underdistention from the mid transverse through rectosigmoid colon with faint pericolonic haze, suggestive of mild colitis. Correlate with exam findings. 2. Colonic diverticulosis without evidence of acute diverticulitis. 3. Rounded hypodense focus towards the level of the cervix measuring up to 11 mm could reflect a small nabothian cyst. 4. Aortic Atherosclerosis (ICD10-I70.0).  On admission the patient was also found to have hyponatremia.  But the patient's CBC showed no elevation of her white count and her hemoglobin was normal at 13.3.  The patient also reports that she has intermittent nausea without any cause that she can put her finger on that makes the nausea calm.  The entire history and physical was done with the aid of interpreter  Past Medical History:  Diagnosis Date  . Arthritis     Past Surgical History:  Procedure Laterality Date  . OVARIAN CYST REMOVAL      Prior to Admission medications   Medication Sig Start Date End Date Taking? Authorizing Provider  gabapentin (NEURONTIN) 100 MG capsule Take 100 mg by mouth at bedtime. 01/14/20  Yes [provider]  methocarbamol (ROBAXIN) 500 MG tablet Take 500 mg by mouth every 8 (eight) hours as needed for muscle spasms.  03/23/20  Yes [provider]  omeprazole (PRILOSEC) 40 MG capsule Take 40 mg by mouth in the morning and at bedtime. 02/28/20  Yes [provider]  amoxicillin-clavulanate (AUGMENTIN) 875-125 MG tablet Take 1 tablet by mouth 2 (two) times daily for 7 days. 04/03/20 04/10/20  Jene EveryKinner, Robert, MD  HYDROcodone-acetaminophen (NORCO/VICODIN) 5-325 MG tablet Take 1 tablet by mouth every 6 (six) hours as needed for severe pain. 04/03/20   Jene EveryKinner, Robert, MD    Family History  Problem Relation Age of Onset  . Breast cancer Neg Hx      Social History    Tobacco Use  . Smoking status: Never Smoker  . Smokeless tobacco: Never Used  Substance Use Topics  . Alcohol use: No  . Drug use: No    Allergies as of 04/03/2020 - Review Complete 04/03/2020  Allergen Reaction Noted  . Atorvastatin Itching 12/21/2016  . Cyclobenzaprine Other (See Comments) 12/31/2015  . Ibuprofen Other (See Comments) 06/06/2017  . Melatonin Other (See Comments) 03/20/2018  . Meloxicam Other (See Comments) 12/31/2015  . Tizanidine Other (See Comments) 12/04/2018  . Venlafaxine Other (See Comments) 05/03/2019    Review of Systems:    All systems reviewed and negative except where noted in HPI.   Physical Exam:  Vital signs in last 24 hours: Temp:  [98.4 F (36.9 C)] 98.4 F (36.9 C) (09/17 1639) Pulse Rate:  [63-89] 63 (09/18 0810) Resp:  [18-19] 18 (09/18 0810) BP: (136-145)/(79-83) 141/80 (09/18 0810) SpO2:  [96 %-98 %] 98 % (09/18 0810) Weight:  [65.8 kg] 65.8 kg (09/17 1644)   General:   Pleasant, cooperative in NAD Head:  Normocephalic and atraumatic. Eyes:   No icterus.   Conjunctiva pink. PERRLA. Ears:  Normal auditory acuity. Neck:  Supple; no masses or thyroidomegaly Lungs: Respirations even and unlabored. Lungs clear to auscultation bilaterally.   No wheezes, crackles, or rhonchi.  Heart:  Regular rate and rhythm;  Without murmur, clicks, rubs or gallops Abdomen:  Soft, nondistended, tenderness to deep palpation and light palpation.  There was also tenderness while flexing the abdominal wall muscles but worse with the abdominal wall muscles relaxed.. Normal bowel sounds. No appreciable masses or hepatomegaly.  No rebound or guarding.  Rectal:  Not performed. Msk:  Symmetrical without gross deformities.   Extremities:  Without edema, cyanosis or clubbing. Neurologic:  Alert and oriented x3;  grossly normal neurologically. Skin:  Intact without significant lesions or rashes. Cervical Nodes:  No significant cervical adenopathy. Psych:  Alert  and cooperative. Normal affect.  LAB RESULTS: Recent Labs    04/03/20 1702 04/04/20 0258  WBC 6.8 5.8  HGB 13.9 13.3  HCT 38.4 35.7*  PLT 247 237   BMET Recent Labs    04/03/20 1702 04/03/20 1702 04/03/20 2014 04/04/20 0258 04/04/20 1110  NA 120*   < > 124* 123* 122*  122*  K 3.1*  --  3.3*  --  3.1*  CL 90*  --  93*  --  93*  CO2 19*  --  21*  --  19*  GLUCOSE 110*  --  104*  --  131*  BUN 7*  --  6*  --  7*  CREATININE 0.37*   < > 0.45 0.41* 0.55  CALCIUM 9.0  --  8.7*  --  8.8*   < > = values in this interval not displayed.   LFT Recent Labs    04/03/20 1702  PROT 7.2  ALBUMIN 4.2  AST 28  ALT  22  ALKPHOS 67  BILITOT 0.7   PT/INR No results for input(s): LABPROT, INR in the last 72 hours.  STUDIES: CT ABDOMEN PELVIS W CONTRAST  Result Date: 04/03/2020 CLINICAL DATA:  Left lower quadrant abdominal pain radiating to left flank EXAM: CT ABDOMEN AND PELVIS WITH CONTRAST TECHNIQUE: Multidetector CT imaging of the abdomen and pelvis was performed using the standard protocol following bolus administration of intravenous contrast. CONTRAST:  OMNIPAQUE IOHEXOL 300 MG/ML  SOLN COMPARISON:  Hepatobiliary scan 09/16/2019, ultrasound 09/16/2018 FINDINGS: Lower chest: 8 mm grounds 5 granuloma seen in the left lung base. Bandlike area of scarring in the medial left base as well. Lung bases are otherwise clear. Normal heart size. No pericardial effusion. Hepatobiliary: There is some geographic hypoattenuation along the falciform ligament in the location quite typical for focal fatty infiltration. Small hypoattenuating subcentimeter foci in the liver (2/31, 24) too small to fully characterize on CT imaging but statistically likely benign. No concerning focal liver lesions. Smooth liver surface contour. Normal gallbladder and biliary tree. Pancreas: Unremarkable. No pancreatic ductal dilatation or surrounding inflammatory changes. Spleen: Normal in size. No concerning splenic  lesions. Adrenals/Urinary Tract: Normal adrenal glands. Kidneys are normally located with symmetric enhancement and excretion. No suspicious renal lesion, urolithiasis or hydronephrosis. Urinary bladder is unremarkable. Stomach/Bowel: Esophagus, stomach and duodenum are free of acute abnormality. No small bowel thickening or dilatation. A normal appendix is visualized. Question some mild colonic thickening versus underdistention from the mid transverse through rectosigmoid colon. Faint pericolonic haze is suspected. Scattered colonic diverticula without focal inflammation to suggest diverticulitis. No evidence of bowel obstruction Vascular/Lymphatic: Atherosclerotic calcifications within the abdominal aorta and branch vessels. No aneurysm or ectasia. No enlarged abdominopelvic lymph nodes. Reproductive: Anteverted uterus. Rounded hypodense focus towards the level of the cervix measuring up to 11 mm could reflect a small nabothian cyst. No worrisome adnexal lesions. Other: No abdominopelvic free fluid or free gas. No bowel containing hernias. Small fat containing umbilical hernia. Few calcified injection granulomata posteriorly. Musculoskeletal: Levocurvature of the lumbar spine, apex L3-4. Multilevel degenerative changes are present in the imaged portions of the spine. No acute osseous abnormality or suspicious osseous lesion. IMPRESSION: 1. Questionable colonic thickening versus underdistention from the mid transverse through rectosigmoid colon with faint pericolonic haze, suggestive of mild colitis. Correlate with exam findings. 2. Colonic diverticulosis without evidence of acute diverticulitis. 3. Rounded hypodense focus towards the level of the cervix measuring up to 11 mm could reflect a small nabothian cyst. 4. Aortic Atherosclerosis (ICD10-I70.0). Electronically Signed   By: Kreg Shropshire M.D.   On: 04/03/2020 19:43      Impression / Plan:   Assessment: Active Problems:   Hyponatremia    Colitis   Alyssa Vega is a 64 y.o. y/o female with a CT scan showing questionable thickening of the mid transverse colon through the rectosigmoid area.  The patient has had an extensive work-up since 2020 for her abdominal pain.  She had been set up for a colonoscopy and EGD in the past and had canceled both of them.  She has now been evaluated earlier this month by her primary gastroenterologist and was recommended to undergo an EGD and colonoscopy which is to take place on October 4.  The patient has no signs of an elevated white cell count nor does she have any diarrhea suggest inflammatory bowel disease or significant colitis.  Plan:  The patient should be treated for her hyponatremia and although she does state that she drinks  a lot of water this is likely not due to excessive water intake.  SIADH should be considered since the patient's serum osmolarity and the patient's sodium are both decreased.  Prior to any endoscopic evaluation the patient should have this treated and this may also help her nausea which is a side effect of SIADH.  As for any luminal investigation of her abdominal pain she already has an appointment for an EGD and colonoscopy on October 4 with the gastroenterologist that has done the extensive work-up on her and I have encouraged her to keep that appointment since this has been an ongoing chronic problem for over a year with exacerbation for many months.  There does not seem to be a urgent cause for any luminal evaluation while in the hospital.  The patient has been explained the plan and agrees with it.  Thank you for involving me in the care of this patient.   Addendum: The patient does report a 5 pound weight loss in the last few months without trying.   LOS: 1 day   Midge Minium, MD, [facility] 04/04/2020, 2:34 PM,  Pager [phone] 7am-5pm  Check AMION for 5pm -7am coverage and on weekends   Note: This dictation was prepared with Dragon dictation along with smaller  phrase technology. Any transcriptional errors that result from this process are unintentional.

## 2020-04-04 NOTE — ED Notes (Signed)
Pt complains ED stretcher is too hard for her to lay on. Brought pt hospital bed. Pt complained hospital bed was too soft. Pt pacing around room and stating that bed would not work either.

## 2020-04-04 NOTE — Progress Notes (Addendum)
PROGRESS NOTE    Alyssa Vega  TFT:[id number] DOB: 11/27/1964 DOA: 04/02/2020 PCP: Patient, No Pcp Per  Outpatient Specialists:     Brief Narrative:  Alyssa Vega is a 64 y.o. female with medical history significant for chronic back pain and several month history of recurring abdominal pain scheduled for panendoscopy in October, who presents to the emergency room with acute worsening of abdominal pain in the past 24 hours.  She states that the pain suddenly worsened and was associated with nausea.  It is felt in the entire lower abdomen but worse on the left side.  She denies vomiting or diarrhea.  Denies fever or chills, dysuria or change in bowel habits.  Patient saw her PCP the day prior for an annual visit and was doing well at the time and her only complaint was for low back pain.  She said she was started on Cymbalta 30 mg, which was added to her usual Robaxin and gabapentin.  She stated that she took 1 dose last night but it made her feel very sick with tingling and nausea.  She had routine blood work as part of her physical exam at her doctor's office which were within normal limits.  ED Course: On arrival, vitals were within normal limits.  Blood work concerning for sodium of 120, down from 138 at her doctor's appointment the day prior.  On repeat sodium 124, potassium 3.3.  CT abdomen and pelvis showed mild colitis.  Hospitalist consulted for admission.   Assessment & Plan:   Principal Problem:   Colon wall thickening Active Problems:   URI (upper respiratory infection)   History of lung cancer   Hypoxia  64 year old female with history of chronic back pain presenting with acute on chronic abdominal pain with finding of hyponatremia and colitis on CT   Hyponatremia - acute -Sodium 120 on arrival, down from 138 the day prior. 123 this morning -Etiology uncertain.  Patient denies  vomiting or diarrhea associated with her abdominal pain but did state that she was started  on new medication that made her feel unwell.  thus does not appear to be volume up or down -- Does not appear to be symptomatic, denies ha, no obtundation, no nausea -possible caused by duloxetine but had just one dose. Chronic gabapentin could contribute -studies suggesting siadh, possible adrenal insufficiency or thyroid dysregulation. Will f/u tsh and morning cosyntropin test -is now > 48 hours out so do not see need for hypertonic saline. Will stop normal saline and fluid restrict -Serial sodium -Consult nephrology if does not continue to improve  Colitis -Patient presenting with acute on chronic abdominal pain with finding of mild colitis on CT abdomen and pelvis but without change in bowel habits, no elevated wbc count -- will check hemoccult -appreciate GI recs, advises no further treatment (will d/c flagyl), f/u as outpatient for previously scheduled egd/colonoscopy  Chronic back pain - stable, no red flag symptoms, worsened by lying on hospital bed -hold home gaba, start oxy prn   DVT prophylaxis: lovenox Code Status: full Family Communication: none The patient is from: home Anticipated d/c is to: home Anticipated d/c date is: 9/21 Patient currently is not medically stable to d/c due to: need for inpatient monitoring and evaluationi  Consultants:   GI  Procedures:   none  Antimicrobials:   Flagyl 9/17 >    Subjective: Abd pain resolved. Appetite decreased. Reports worsening of chronic low back pain. No nausea. No headache or vision changes.  No le weakness ornumbness or loss of bowel/bladder  Objective: Vitals:   04/03/20 0430 04/03/20 0500 04/03/20 0530 04/03/20 0600  BP: 128/79 119/81 130/87 119/82  Pulse:   (!) 119 (!) 114  Resp: 19 18 (!) 21 19  Temp:      TempSrc:      SpO2:   93% 93%  Weight:      Height:        Intake/Output Summary (Last 24 hours) at 04/03/2020 1328 Last data filed at 04/03/2020 1000 Gross per 24 hour  Intake 360 ml  Output --   Net 360 ml   Filed Weights   04/02/20 0345 04/02/20 1132  Weight: 90.7 kg 90 kg    Examination:  General exam: Appears calm and comfortable  Respiratory system: Clear to auscultation. Respiratory effort normal. Cardiovascular system: S1 & S2 heard, RRR. No JVD, murmurs, rubs, gallops or clicks. No pedal edema. Gastrointestinal system: Abdomen is nondistended, soft and nontender. No organomegaly or masses felt. Normal bowel sounds heard. Central nervous system: Alert and oriented. No focal neurological deficits. Extremities: Symmetric 5 x 5 power. Skin: No rashes, lesions or ulcers Psychiatry: Judgement and insight appear normal. Mood & affect appropriate.     Data Reviewed: I have personally reviewed following labs and imaging studies  CBC: Recent Labs  Lab 04/01/20 0908 04/02/20 0349 04/03/20 0420  WBC 6.5 8.3 7.7  NEUTROABS 4.9  --   --   HGB 14.9 15.0 14.0  HCT 44.5 43.4 41.4  MCV 90.4 88.6 92.0  PLT 181 201 189   Basic Metabolic Panel: Recent Labs  Lab 04/01/20 0908 04/02/20 0349 04/03/20 0420  NA 139 140 141  K 4.1 3.6 4.2  CL 101 104 103  CO2 28 25 27   GLUCOSE 109* 117* 107*  BUN 7 14 12   CREATININE 0.95 0.92 0.93  CALCIUM 8.7* 8.9 8.2*  MG 1.9  --   --    GFR: Estimated Creatinine Clearance: 93.2 mL/min (by C-G formula based on SCr of 0.93 mg/dL). Liver Function Tests: Recent Labs  Lab 04/01/20 0908 04/02/20 0349 04/03/20 0420  AST 19 24 22   ALT 17 21 19   ALKPHOS 78 74 66  BILITOT 1.0 0.8 0.9  PROT 7.4 7.5 7.1  ALBUMIN 4.3 4.1 3.9   Recent Labs  Lab 04/02/20 0349  LIPASE 24   No results for input(s): AMMONIA in the last 168 hours. Coagulation Profile: No results for input(s): INR, PROTIME in the last 168 hours. Cardiac Enzymes: No results for input(s): CKTOTAL, CKMB, CKMBINDEX, TROPONINI in the last 168 hours. BNP (last 3 results) No results for input(s): PROBNP in the last 8760 hours. HbA1C: No results for input(s): HGBA1C in  the last 72 hours. CBG: No results for input(s): GLUCAP in the last 168 hours. Lipid Profile: No results for input(s): CHOL, HDL, LDLCALC, TRIG, CHOLHDL, LDLDIRECT in the last 72 hours. Thyroid Function Tests: No results for input(s): TSH, T4TOTAL, FREET4, T3FREE, THYROIDAB in the last 72 hours. Anemia Panel: No results for input(s): VITAMINB12, FOLATE, FERRITIN, TIBC, IRON, RETICCTPCT in the last 72 hours. Urine analysis:    Component Value Date/Time   COLORURINE YELLOW (A) 04/02/2020 0349   APPEARANCEUR HAZY (A) 04/02/2020 0349   APPEARANCEUR Clear 04/15/2015 1500   LABSPEC 1.029 04/02/2020 0349   PHURINE 5.0 04/02/2020 0349   GLUCOSEU NEGATIVE 04/02/2020 0349   HGBUR NEGATIVE 04/02/2020 0349   BILIRUBINUR NEGATIVE 04/02/2020 0349   BILIRUBINUR Negative 04/15/2015 1500   KETONESUR NEGATIVE 04/02/2020  0349   PROTEINUR 30 (A) 04/02/2020 0349   NITRITE NEGATIVE 04/02/2020 0349   LEUKOCYTESUR NEGATIVE 04/02/2020 0349   Sepsis Labs: @LABRCNTIP (procalcitonin:4,lacticidven:4)  ) Recent Results (from the past 240 hour(s))  SARS Coronavirus 2 by RT PCR (hospital order, performed in [facility] hospital lab) Nasopharyngeal Nasopharyngeal Swab     Status: None   Collection Time: 04/01/20  8:43 AM   Specimen: Nasopharyngeal Swab  Result Value Ref Range Status   SARS Coronavirus 2 NEGATIVE NEGATIVE Final    Comment: (NOTE) SARS-CoV-2 target nucleic acids are NOT DETECTED.  The SARS-CoV-2 RNA is generally detectable in upper and lower respiratory specimens during the acute phase of infection. The lowest concentration of SARS-CoV-2 viral copies this assay can detect is 250 copies / mL. A negative result does not preclude SARS-CoV-2 infection and should not be used as the sole basis for treatment or other patient management decisions.  A negative result may occur with improper specimen collection / handling, submission of specimen other than nasopharyngeal swab, presence of viral  mutation(s) within the areas targeted by this assay, and inadequate number of viral copies (<250 copies / mL). A negative result must be combined with clinical observations, patient history, and epidemiological information.  Fact Sheet for Patients:   04/03/20  Fact Sheet for Healthcare Providers: BoilerBrush.com.cy  This test is not yet approved or  cleared by the https://pope.com/ FDA and has been authorized for detection and/or diagnosis of SARS-CoV-2 by FDA under an Emergency Use Authorization (EUA).  This EUA will remain in effect (meaning this test can be used) for the duration of the COVID-19 declaration under Section 564(b)(1) of the Act, 21 U.S.C. section 360bbb-3(b)(1), unless the authorization is terminated or revoked sooner.  Performed at [facility], [address] [place], [place]   SARS Coronavirus 2 by RT PCR (hospital order, performed in [facility] hospital lab) Nasopharyngeal Nasopharyngeal Swab     Status: None   Collection Time: 04/02/20  4:09 PM   Specimen: Nasopharyngeal Swab  Result Value Ref Range Status   SARS Coronavirus 2 NEGATIVE NEGATIVE Final    Comment: (NOTE) SARS-CoV-2 target nucleic acids are NOT DETECTED.  The SARS-CoV-2 RNA is generally detectable in upper and lower respiratory specimens during the acute phase of infection. The lowest concentration of SARS-CoV-2 viral copies this assay can detect is 250 copies / mL. A negative result does not preclude SARS-CoV-2 infection and should not be used as the sole basis for treatment or other patient management decisions.  A negative result may occur with improper specimen collection / handling, submission of specimen other than nasopharyngeal swab, presence of viral mutation(s) within the areas targeted by this assay, and inadequate number of viral copies (<250 copies / mL). A negative result must be combined with  clinical observations, patient history, and epidemiological information.  Fact Sheet for Patients:   04/04/20  Fact Sheet for Healthcare Providers: BoilerBrush.com.cy  This test is not yet approved or  cleared by the https://pope.com/ FDA and has been authorized for detection and/or diagnosis of SARS-CoV-2 by FDA under an Emergency Use Authorization (EUA).  This EUA will remain in effect (meaning this test can be used) for the duration of the COVID-19 declaration under Section 564(b)(1) of the Act, 21 U.S.C. section 360bbb-3(b)(1), unless the authorization is terminated or revoked sooner.  Performed at [facility], [address] [place], [place]          Radiology Studies: DG Abd  1 View  Result Date: 04/03/2020 CLINICAL DATA:  : Obstruction EXAM: ABDOMEN - 1 VIEW COMPARISON:  04/02/2020 CT FINDINGS: Continued bowel dilatation most notable throughout the colon into the distal descending colon. Small bowel dilatation also noted. Findings could reflect ileus or distal colonic obstruction. Contrast material seen within the bladder. No organomegaly or free air. IMPRESSION: Continued bowel distension, most notable within the colon to the level of the distal descending colon. This could reflect ileus or distal colonic obstruction. Electronically Signed   By: Charlett Nose M.D.   On: 04/03/2020 11:25   CT CHEST ABDOMEN PELVIS W CONTRAST  Result Date: 04/02/2020 CLINICAL DATA:  Cough, acute generalized abdominal pain. EXAM: CT CHEST, ABDOMEN, AND PELVIS WITH CONTRAST TECHNIQUE: Multidetector CT imaging of the chest, abdomen and pelvis was performed following the standard protocol during bolus administration of intravenous contrast. CONTRAST:  OMNIPAQUE IOHEXOL 300 MG/ML  SOLN COMPARISON:  Dec 03, 2019.  June 24, 2019. FINDINGS: CT CHEST FINDINGS Cardiovascular: Normal cardiac size. No pericardial effusion. No  evidence of thoracic aortic dissection or aneurysm. Status post internal mammary bypass graft. Mediastinum/Nodes: No enlarged mediastinal, hilar, or axillary lymph nodes. Thyroid gland, trachea, and esophagus demonstrate no significant findings. Lungs/Pleura: No pneumothorax or pleural effusion is noted. 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 Right lung is unremarkable. Musculoskeletal: No chest wall mass or suspicious bone lesions identified. CT ABDOMEN PELVIS FINDINGS Hepatobiliary: No focal liver abnormality is seen. No gallstones, gallbladder wall thickening, or biliary dilatation. Pancreas: Unremarkable. No pancreatic ductal dilatation or surrounding inflammatory changes. Spleen: Normal in size without focal abnormality. Adrenals/Urinary Tract: Adrenal glands are unremarkable. Kidneys are normal, without renal calculi, focal lesion, or hydronephrosis. Bladder is unremarkable. Stomach/Bowel: The stomach appears normal. No small bowel dilatation is noted. The appendix is not visualized. Air-filled and dilated transverse and right colon is noted as well as some dilatation of distal descending colon. Stool is noted in the sigmoid colon and rectum. There is noted irregular wall thickening involving a portion of the splenic flexure and proximal descending colon which may represent scarring from prior inflammation, but colonoscopy is recommended to rule out possible neoplasm. Vascular/Lymphatic: Aortic atherosclerosis. No enlarged abdominal or pelvic lymph nodes. Reproductive: Stable mild prostatic enlargement is noted. Other: No abdominal wall hernia or abnormality. No abdominopelvic ascites. Musculoskeletal: No acute or significant osseous findings. IMPRESSION: 1. Air-filled and dilated transverse and right colon is noted as well as some dilatation of distal descending colon. There is noted irregular wall thickening  involving a portion of the splenic flexure and proximal descending colon which may represent scarring from prior inflammation, but colonoscopy is recommended to rule out possible neoplasm. 2. Stable mild prostatic enlargement. 3. 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 4. Aortic atherosclerosis. Aortic Atherosclerosis (ICD10-I70.0). Electronically Signed   By: Lupita Raider M.D.   On: 04/02/2020 14:00        Scheduled Meds:  albuterol  2.5 mg Nebulization Q6H   predniSONE  40 mg Oral Q breakfast   Continuous Infusions:  sodium chloride       LOS: 1 day    Time spent: 50 min    Silvano Bilis, MD [facility]  If 7PM-7AM, please contact night-coverage [email or web address] Password [email or web address] 04/03/2020, 1:28 PM

## 2020-04-04 NOTE — ED Notes (Signed)
Discussed pain mgmt regimen with pt. Pt declines PT tylenol. Will give oxycodone 5mg  in about 1 hour. Discussed not overdosing with opioids. Pt states has chronic back pain but this has been worse since at hospital.

## 2020-04-05 DIAGNOSIS — R1030 Lower abdominal pain, unspecified: Secondary | ICD-10-CM

## 2020-04-05 LAB — BASIC METABOLIC PANEL
Anion gap: 7 (ref 5–15)
Anion gap: 12 (ref 5–15)
BUN: 6 mg/dL — ABNORMAL LOW (ref 8–23)
BUN: 10 mg/dL (ref 8–23)
CO2: 24 mmol/L (ref 22–32)
CO2: 21 mmol/L — ABNORMAL LOW (ref 22–32)
Calcium: 9.1 mg/dL (ref 8.9–10.3)
Calcium: 9.1 mg/dL (ref 8.9–10.3)
Chloride: 102 mmol/L (ref 98–111)
Chloride: 100 mmol/L (ref 98–111)
Creatinine, Ser: 0.62 mg/dL (ref 0.44–1.00)
Creatinine, Ser: 0.67 mg/dL (ref 0.44–1.00)
GFR calc Af Amer: >60 mL/min (ref >60)
GFR calc Af Amer: >60 mL/min (ref >60)
GFR calc non Af Amer: >60 mL/min (ref >60)
GFR calc non Af Amer: >60 mL/min (ref >60)
Glucose, Bld: 95 mg/dL (ref 70–99)
Glucose, Bld: 128 mg/dL — ABNORMAL HIGH (ref 70–99)
Potassium: 4 mmol/L (ref 3.5–5.1)
Potassium: 3.4 mmol/L — ABNORMAL LOW (ref 3.5–5.1)
Sodium: 133 mmol/L — ABNORMAL LOW (ref 135–145)
Sodium: 133 mmol/L — ABNORMAL LOW (ref 135–145)

## 2020-04-05 LAB — ACTH STIMULATION, 3 TIME POINTS
Cortisol, 30 Min: 23.8 ug/dL
Cortisol, 60 Min: 26.4 ug/dL
Cortisol, Base: 4.6 ug/dL

## 2020-04-05 NOTE — Plan of Care (Signed)
Continuing with plan of care. 

## 2020-04-05 NOTE — Progress Notes (Addendum)
[facility]  ROUNDING NOTE   Subjective:   Patient does not have any complaints this morning, reports her back pain has improved due to the more comfortable beds on the floor compared to the ED. States she does not currently have any abdominal pain. Denies any nausea, confusion, dizziness. Patient is not sure why she was started on the cymbalta.   Spanish interpreter was present for entire interaction.  Objective:  Vital signs in last 24 hours:  Temp:  [97.7 F (36.5 C)-98.2 F (36.8 C)] 98.2 F (36.8 C) (09/19 0858) Pulse Rate:  [63-77] 75 (09/19 0858) Resp:  [16] 16 (09/19 0858) BP: (118-157)/(59-79) 118/67 (09/19 0858) SpO2:  [96 %-98 %] 96 % (09/19 0858) Weight:  [64.9 kg] 64.9 kg (09/18 2140)  Weight change: -0.862 kg Filed Weights   04/03/20 1644 04/04/20 2140  Weight: 65.8 kg 64.9 kg    Intake/Output: I/O last 3 completed shifts: In: 1077.9 [I.V.:1077.9] Out: -    Intake/Output this shift:  Total I/O In: 360 [P.O.:360] Out: -   Physical Exam: General: NAD,   Head: Normocephalic, atraumatic. Moist oral mucosal membranes  Eyes: Anicteric, PERRL  Neck: Supple, trachea midline  Lungs:  Clear to auscultation  Heart: Regular rate and rhythm  Abdomen:  Soft, nontender,   Extremities:  No peripheral edema.  Neurologic: Nonfocal, moving all four extremities  Skin: No lesions       Basic Metabolic Panel: Recent Labs  Lab 04/03/20 1702 04/03/20 1702 04/03/20 2014 04/04/20 0258 04/04/20 1110 04/04/20 2235 04/05/20 0519  NA 120*   < > 124* 123* 122*   122* 127* 133*  K 3.1*  --  3.3*  --  3.1*  --  4.0  CL 90*  --  93*  --  93*  --  102  CO2 19*  --  21*  --  19*  --  24  GLUCOSE 110*  --  104*  --  131*  --  95  BUN 7*  --  6*  --  7*  --  6*  CREATININE 0.37*  --  0.45 0.41* 0.55  --  0.62  CALCIUM 9.0   < > 8.7*  --  8.8*  --  9.1  MG  --   --   --  2.0  --   --   --    < > = values in this interval not displayed.    Liver Function  Tests: Recent Labs  Lab 04/03/20 1702  AST 28  ALT 22  ALKPHOS 67  BILITOT 0.7  PROT 7.2  ALBUMIN 4.2   Recent Labs  Lab 04/03/20 1702  LIPASE 34   No results for input(s): AMMONIA in the last 168 hours.  CBC: Recent Labs  Lab 04/03/20 1702 04/04/20 0258  WBC 6.8 5.8  HGB 13.9 13.3  HCT 38.4 35.7*  MCV 82.8 80.6  PLT 247 237    Cardiac Enzymes: No results for input(s): CKTOTAL, CKMB, CKMBINDEX, TROPONINI in the last 168 hours.  BNP: Invalid input(s): POCBNP  CBG: No results for input(s): GLUCAP in the last 168 hours.  Microbiology: Results for orders placed or performed during the hospital encounter of 04/03/20  SARS Coronavirus 2 by RT PCR (hospital order, performed in [facility] hospital lab) Nasopharyngeal Nasopharyngeal Swab     Status: None   Collection Time: 04/03/20  7:17 PM   Specimen: Nasopharyngeal Swab  Result Value Ref Range Status   SARS Coronavirus 2 NEGATIVE NEGATIVE Final  Comment: (NOTE) SARS-CoV-2 target nucleic acids are NOT DETECTED.  The SARS-CoV-2 RNA is generally detectable in upper and lower respiratory specimens during the acute phase of infection. The lowest concentration of SARS-CoV-2 viral copies this assay can detect is 250 copies / mL. A negative result does not preclude SARS-CoV-2 infection and should not be used as the sole basis for treatment or other patient management decisions.  A negative result may occur with improper specimen collection / handling, submission of specimen other than nasopharyngeal swab, presence of viral mutation(s) within the areas targeted by this assay, and inadequate number of viral copies (<250 copies / mL). A negative result must be combined with clinical observations, patient history, and epidemiological information.  Fact Sheet for Patients:   BoilerBrush.com.cy  Fact Sheet for Healthcare Providers: https://pope.com/  This test is not yet  approved or  cleared by the [place] FDA and has been authorized for detection and/or diagnosis of SARS-CoV-2 by FDA under an Emergency Use Authorization (EUA).  This EUA will remain in effect (meaning this test can be used) for the duration of the COVID-19 declaration under Section 564(b)(1) of the Act, 21 U.S.C. section 360bbb-3(b)(1), unless the authorization is terminated or revoked sooner.  Performed at [facility], [address] [place], [place] [address]     Coagulation Studies: No results for input(s): LABPROT, INR in the last 72 hours.  Urinalysis: Recent Labs    04/03/20 1702  COLORURINE YELLOW*  LABSPEC 1.018  PHURINE 7.0  GLUCOSEU NEGATIVE  HGBUR MODERATE*  BILIRUBINUR NEGATIVE  KETONESUR 5*  PROTEINUR NEGATIVE  NITRITE NEGATIVE  LEUKOCYTESUR NEGATIVE      Imaging: CT ABDOMEN PELVIS W CONTRAST  Result Date: 04/03/2020 CLINICAL DATA:  Left lower quadrant abdominal pain radiating to left flank EXAM: CT ABDOMEN AND PELVIS WITH CONTRAST TECHNIQUE: Multidetector CT imaging of the abdomen and pelvis was performed using the standard protocol following bolus administration of intravenous contrast. CONTRAST:  OMNIPAQUE IOHEXOL 300 MG/ML  SOLN COMPARISON:  Hepatobiliary scan 09/16/2019, ultrasound 09/16/2018 FINDINGS: Lower chest: 8 mm grounds 5 granuloma seen in the left lung base. Bandlike area of scarring in the medial left base as well. Lung bases are otherwise clear. Normal heart size. No pericardial effusion. Hepatobiliary: There is some geographic hypoattenuation along the falciform ligament in the location quite typical for focal fatty infiltration. Small hypoattenuating subcentimeter foci in the liver (2/31, 24) too small to fully characterize on CT imaging but statistically likely benign. No concerning focal liver lesions. Smooth liver surface contour. Normal gallbladder and biliary tree. Pancreas: Unremarkable. No pancreatic ductal dilatation  or surrounding inflammatory changes. Spleen: Normal in size. No concerning splenic lesions. Adrenals/Urinary Tract: Normal adrenal glands. Kidneys are normally located with symmetric enhancement and excretion. No suspicious renal lesion, urolithiasis or hydronephrosis. Urinary bladder is unremarkable. Stomach/Bowel: Esophagus, stomach and duodenum are free of acute abnormality. No small bowel thickening or dilatation. A normal appendix is visualized. Question some mild colonic thickening versus underdistention from the mid transverse through rectosigmoid colon. Faint pericolonic haze is suspected. Scattered colonic diverticula without focal inflammation to suggest diverticulitis. No evidence of bowel obstruction Vascular/Lymphatic: Atherosclerotic calcifications within the abdominal aorta and branch vessels. No aneurysm or ectasia. No enlarged abdominopelvic lymph nodes. Reproductive: Anteverted uterus. Rounded hypodense focus towards the level of the cervix measuring up to 11 mm could reflect a small nabothian cyst. No worrisome adnexal lesions. Other: No abdominopelvic free fluid or free gas. No bowel containing hernias. Small fat containing umbilical hernia.  Few calcified injection granulomata posteriorly. Musculoskeletal: Levocurvature of the lumbar spine, apex L3-4. Multilevel degenerative changes are present in the imaged portions of the spine. No acute osseous abnormality or suspicious osseous lesion. IMPRESSION: 1. Questionable colonic thickening versus underdistention from the mid transverse through rectosigmoid colon with faint pericolonic haze, suggestive of mild colitis. Correlate with exam findings. 2. Colonic diverticulosis without evidence of acute diverticulitis. 3. Rounded hypodense focus towards the level of the cervix measuring up to 11 mm could reflect a small nabothian cyst. 4. Aortic Atherosclerosis (ICD10-I70.0). Electronically Signed   By: Kreg Shropshire M.D.   On: 04/03/2020 19:43      Medications:     enoxaparin (LOVENOX) injection  40 mg Subcutaneous Q24H   pantoprazole  40 mg Oral Daily   acetaminophen **OR** acetaminophen, HYDROcodone-acetaminophen, ondansetron **OR** ondansetron (ZOFRAN) IV, oxyCODONE  Assessment/ Plan:  Alyssa Vega is a 64 y.o. hispanic female with arthritis, GERD, lower back pain and abdominal pain who was admitted to [facility] on 04/03/20 for hyponatremia.   1. Hyponatremia: acute. Sodium was 138 on 9/16. On admission was 120 and then was given IV normal saline.  - sodium has improved to 133 since stopping cymbalta.   2. Hypokalemia - was given Klor-Con yesterday  - potassium improved to 4.0    3. Hypertension:  - BP improved to 118/67. -  No history of essential hypertension. Not currently on any antihypertensives    LOS: 2 [place] P Tedrow 9/19/202112:14 PM  Patient was seen and examined with [facility]. Above plan was discussed and agreed upon on the signing of this note.   Lamont Dowdy, MD [facility]  9/19/20212:10 PM

## 2020-04-05 NOTE — Discharge Summary (Signed)
Alyssa Vega ZOX:[id number] DOB: 1956-07-07 DOA: 04/03/2020  PCP: Alyssa IvanLinthavong, Kanhka, MD  Admit date: 04/03/2020 Discharge date: 04/05/2020  Time spent: 25 minutes  Recommendations for Outpatient Follow-up:  1. Will need sodium checked within the next week    Discharge Diagnoses:  Active Problems:   Hyponatremia   Colitis   Lower abdominal pain   Discharge Condition: good  Diet recommendation: regular  Filed Weights   04/03/20 1644 04/04/20 2140  Weight: 65.8 kg 64.9 kg    History of present illness:  Alyssa A Agudois a 64 y.o.femalewith medical history significant forchronic back pain and several month history of recurring abdominal pain scheduled for panendoscopy in October, who presents to the emergency room with acute worsening of abdominal pain in the past 24 hours.She states that the pain suddenly worsened and was associated with nausea. It is felt in the entire lower abdomen but worse on the left side. She denies vomiting or diarrhea. Denies fever or chills, dysuria or change in bowel habits. Patient saw her PCP the day prior for an annual visit and was doing well at the time and her only complaint was for low back pain. She said she was started on Cymbalta 30 mg, which was added to her usual Robaxin and gabapentin. She stated that she took 1 dose last night but it made her feel very sick with tingling and nausea. She had routine blood work as part of her physical exam at her doctor's office which were within normal limits. ED Course:On arrival, vitals were within normal limits. Blood work concerning for sodium of 120, down from 138 at her doctor's appointment the day prior. On repeat sodium 124, potassium 3.3. CT abdomen and pelvis showed mild colitis. Hospitalist consulted for admission.  Hospital Course:  Hyponatremia - acute -Sodium 120 on arrival, down from 138 the day prior. 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 Chronic gabapentin could contribute -studies suggesting siadh. TSH wnl. morning cosyntropin test normal,  - nephrology followed - will need outpatient sodium checked within the next week  Possible Colitis -Patient presenting with acute on chronic abdominal pain with finding of mild colitis on CT abdomen and pelvis but without change in bowel habits, no elevated wbc count -appreciate GI recs, advises no further treatment (will d/c flagyl), f/u as outpatient for previously scheduled egd/colonoscopy   Procedures:  none  Consultations:  GI, nephrology  Discharge Exam: Vitals:   04/05/20 0548 04/05/20 0858  BP: 125/70 118/67  Pulse: 63 75  Resp: 16 16  Temp: 97.7 F (36.5 C) 98.2 F (36.8 C)  SpO2: 98% 96%    General exam: Appears calm and comfortable  Respiratory system: Clear to auscultation. Respiratory effort normal. Cardiovascular system: S1 & S2 heard, RRR. No JVD, murmurs, rubs, gallops or clicks. No pedal edema. Gastrointestinal system: Abdomen is nondistended, soft and nontender. No organomegaly or masses felt. Normal bowel sounds heard. Central nervous system: Alert and oriented. No focal neurological deficits. Extremities: Symmetric 5 x 5 power. Skin: No rashes, lesions or ulcers Psychiatry: Judgement and insight appear normal. Mood & affect appropriate.   Discharge Instructions   Discharge Instructions    Call MD for:  difficulty breathing, headache or visual disturbances   Complete by: As directed    Call MD for:  extreme fatigue   Complete by: As directed    Call MD for:  persistant nausea and vomiting   Complete by: As directed  Call MD for:  redness, tenderness, or signs of infection (pain, swelling, redness, odor or green/yellow discharge around incision site)   Complete by: As directed    Call MD for:  temperature >100.4   Complete by: As directed    Diet general   Complete by: As directed    Increase activity slowly   Complete by:  As directed      Allergies as of 04/05/2020      Reactions   Duloxetine    hyponatremia   Atorvastatin Itching   Cyclobenzaprine Other (See Comments)   Sores in mouth/throat   Ibuprofen Other (See Comments)   Mouth sores   Melatonin Other (See Comments)   Stomach pain   Meloxicam Other (See Comments)   Sores/ulcerations mouth/tongue/throat   Tizanidine Other (See Comments)   Nightmares, insomnia    Venlafaxine Other (See Comments)   Did not feel well at all, very lethargic       Medication List    STOP taking these medications   gabapentin 100 MG capsule Commonly known as: NEURONTIN     TAKE these medications   methocarbamol 500 MG tablet Commonly known as: ROBAXIN Take 500 mg by mouth every 8 (eight) hours as needed for muscle spasms.   omeprazole 40 MG capsule Commonly known as: PRILOSEC Take 40 mg by mouth in the morning and at bedtime.      Allergies  Allergen Reactions  . Duloxetine     hyponatremia  . Atorvastatin Itching  . Cyclobenzaprine Other (See Comments)    Sores in mouth/throat  . Ibuprofen Other (See Comments)    Mouth sores  . Melatonin Other (See Comments)    Stomach pain  . Meloxicam Other (See Comments)    Sores/ulcerations mouth/tongue/throat  . Tizanidine Other (See Comments)    Nightmares, insomnia   . Venlafaxine Other (See Comments)    Did not feel well at all, very lethargic     Follow-up Information    Alyssa Ivan, MD. Schedule an appointment as soon as possible for a visit.   Specialty: Family Medicine Why: within 1 week Contact information: [address] [facility] [place] [address] [phone]                The results of significant diagnostics from this hospitalization (including imaging, microbiology, ancillary and laboratory) are listed below for reference.    Significant Diagnostic Studies: MR CERVICAL SPINE WO CONTRAST  Result Date: 03/30/2020 CLINICAL DATA:  Neck pain  with right arm and leg numbness and weakness. EXAM: MRI CERVICAL SPINE WITHOUT CONTRAST TECHNIQUE: Multiplanar, multisequence MR imaging of the cervical spine was performed. No intravenous contrast was administered. COMPARISON:  MRI cervical spine 04/11/2019 FINDINGS: Alignment: 2 mm retrolisthesis C6-7 unchanged. Straightening of the cervical lordosis also unchanged. Vertebrae: Normal bone marrow.  Negative for fracture or mass. Cord: Normal spinal cord signal. Posterior Fossa, vertebral arteries, paraspinal tissues: Negative Disc levels: C2-3: Negative C3-4: Mild disc degeneration. Negative for disc protrusion or stenosis C4-5: Mild disc and mild facet degeneration. Negative for disc protrusion or stenosis C5-6: Mild disc and mild facet degeneration. Negative for disc protrusion or stenosis C6-7: 2 mm retrolisthesis. Diffuse disc bulging and uncinate spurring. Bilateral facet hypertrophy. Mild to moderate spinal stenosis and mild to moderate foraminal stenosis bilaterally. No change from the prior study. C7-T1: Negative IMPRESSION: 2 mm retrolisthesis C6-7. Mild to moderate spinal and foraminal stenosis bilaterally due to spurring. No change from the prior MRI. Electronically Signed  By: Marlan Palau M.D.   On: 03/30/2020 11:38   MR LUMBAR SPINE WO CONTRAST  Result Date: 03/30/2020 CLINICAL DATA:  Back pain with right leg numbness and weakness. EXAM: MRI LUMBAR SPINE WITHOUT CONTRAST TECHNIQUE: Multiplanar, multisequence MR imaging of the lumbar spine was performed. No intravenous contrast was administered. COMPARISON:  None. FINDINGS: Segmentation:  Normal Alignment:  Normal Vertebrae:  Normal bone marrow.  Negative for fracture or mass. Conus medullaris and cauda equina: Conus extends to the L2 level. Conus and cauda equina appear normal. Paraspinal and other soft tissues: Negative for paraspinous mass or adenopathy. No soft tissue edema or fluid collection. Disc levels: L1-2: Negative L2-3: Negative  L3-4: Mild disc degeneration and disc bulging. Negative for disc protrusion or stenosis. L4-5: Disc degeneration with disc space narrowing right greater than left. Mild endplate spurring on the right with small right extraforaminal disc protrusion displacing the right L4 nerve root. Mild facet degeneration. Spinal canal adequate in size. L5-S1: Mild disc degeneration with disc space narrowing. Small central disc protrusion and mild facet degeneration. Negative for neural impingement. 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 Small central disc protrusion at L5-S1 without neural impingement. Electronically Signed   By: Marlan Palau M.D.   On: 03/30/2020 11:43   CT ABDOMEN PELVIS W CONTRAST  Result Date: 04/03/2020 CLINICAL DATA:  Left lower quadrant abdominal pain radiating to left flank EXAM: CT ABDOMEN AND PELVIS WITH CONTRAST TECHNIQUE: Multidetector CT imaging of the abdomen and pelvis was performed using the standard protocol following bolus administration of intravenous contrast. CONTRAST:  OMNIPAQUE IOHEXOL 300 MG/ML  SOLN COMPARISON:  Hepatobiliary scan 09/16/2019, ultrasound 09/16/2018 FINDINGS: Lower chest: 8 mm grounds 5 granuloma seen in the left lung base. Bandlike area of scarring in the medial left base as well. Lung bases are otherwise clear. Normal heart size. No pericardial effusion. Hepatobiliary: There is some geographic hypoattenuation along the falciform ligament in the location quite typical for focal fatty infiltration. Small hypoattenuating subcentimeter foci in the liver (2/31, 24) too small to fully characterize on CT imaging but statistically likely benign. No concerning focal liver lesions. Smooth liver surface contour. Normal gallbladder and biliary tree. Pancreas: Unremarkable. No pancreatic ductal dilatation or surrounding inflammatory changes. Spleen:  Normal in size. No concerning splenic lesions. Adrenals/Urinary Tract: Normal adrenal glands. Kidneys are normally located with symmetric enhancement and excretion. No suspicious renal lesion, urolithiasis or hydronephrosis. Urinary bladder is unremarkable. Stomach/Bowel: Esophagus, stomach and duodenum are free of acute abnormality. No small bowel thickening or dilatation. A normal appendix is visualized. Question some mild colonic thickening versus underdistention from the mid transverse through rectosigmoid colon. Faint pericolonic haze is suspected. Scattered colonic diverticula without focal inflammation to suggest diverticulitis. No evidence of bowel obstruction Vascular/Lymphatic: Atherosclerotic calcifications within the abdominal aorta and branch vessels. No aneurysm or ectasia. No enlarged abdominopelvic lymph nodes. Reproductive: Anteverted uterus. Rounded hypodense focus towards the level of the cervix measuring up to 11 mm could reflect a small nabothian cyst. No worrisome adnexal lesions. Other: No abdominopelvic free fluid or free gas. No bowel containing hernias. Small fat containing umbilical hernia. Few calcified injection granulomata posteriorly. Musculoskeletal: Levocurvature of the lumbar spine, apex L3-4. Multilevel degenerative changes are present in the imaged portions of the spine. No acute osseous abnormality or suspicious osseous lesion. IMPRESSION: 1. Questionable colonic thickening versus underdistention from the mid transverse through rectosigmoid colon with  faint pericolonic haze, suggestive of mild colitis. Correlate with exam findings. 2. Colonic diverticulosis without evidence of acute diverticulitis. 3. Rounded hypodense focus towards the level of the cervix measuring up to 11 mm could reflect a small nabothian cyst. 4. Aortic Atherosclerosis (ICD10-I70.0). Electronically Signed   By: Kreg Shropshire M.D.   On: 04/03/2020 19:43    Microbiology: Recent Results (from the past 240  hour(s))  SARS Coronavirus 2 by RT PCR (hospital order, performed in [facility] hospital lab) Nasopharyngeal Nasopharyngeal Swab     Status: None   Collection Time: 04/03/20  7:17 PM   Specimen: Nasopharyngeal Swab  Result Value Ref Range Status   SARS Coronavirus 2 NEGATIVE NEGATIVE Final    Comment: (NOTE) SARS-CoV-2 target nucleic acids are NOT DETECTED.  The SARS-CoV-2 RNA is generally detectable in upper and lower respiratory specimens during the acute phase of infection. The lowest concentration of SARS-CoV-2 viral copies this assay can detect is 250 copies / mL. A negative result does not preclude SARS-CoV-2 infection and should not be used as the sole basis for treatment or other patient management decisions.  A negative result may occur with improper specimen collection / handling, submission of specimen other than nasopharyngeal swab, presence of viral mutation(s) within the areas targeted by this assay, and inadequate number of viral copies (<250 copies / mL). A negative result must be combined with clinical observations, patient history, and epidemiological information.  Fact Sheet for Patients:   BoilerBrush.com.cy  Fact Sheet for Healthcare Providers: https://pope.com/  This test is not yet approved or  cleared by the [place] FDA and has been authorized for detection and/or diagnosis of SARS-CoV-2 by FDA under an Emergency Use Authorization (EUA).  This EUA will remain in effect (meaning this test can be used) for the duration of the COVID-19 declaration under Section 564(b)(1) of the Act, 21 U.S.C. section 360bbb-3(b)(1), unless the authorization is terminated or revoked sooner.  Performed at [facility], [address] [place], [place] [address]      Labs: Basic Metabolic Panel: Recent Labs  Lab 04/03/20 1702 04/03/20 1702 04/03/20 2014 04/03/20 2014 04/04/20 0258 04/04/20 1110  04/04/20 2235 04/05/20 0519 04/05/20 1425  NA 120*   < > 124*   < > 123* 122*  122* 127* 133* 133*  K 3.1*  --  3.3*  --   --  3.1*  --  4.0 3.4*  CL 90*  --  93*  --   --  93*  --  102 100  CO2 19*  --  21*  --   --  19*  --  24 21*  GLUCOSE 110*  --  104*  --   --  131*  --  95 128*  BUN 7*  --  6*  --   --  7*  --  6* 10  CREATININE 0.37*   < > 0.45  --  0.41* 0.55  --  0.62 0.67  CALCIUM 9.0  --  8.7*  --   --  8.8*  --  9.1 9.1  MG  --   --   --   --  2.0  --   --   --   --    < > = values in this interval not displayed.   Liver Function Tests: Recent Labs  Lab 04/03/20 1702  AST 28  ALT 22  ALKPHOS 67  BILITOT 0.7  PROT 7.2  ALBUMIN 4.2   Recent Labs  Lab 04/03/20 1702  LIPASE 34   No results for input(s): AMMONIA in the last 168 hours. CBC: Recent Labs  Lab 04/03/20 1702 04/04/20 0258  WBC 6.8 5.8  HGB 13.9 13.3  HCT 38.4 35.7*  MCV 82.8 80.6  PLT 247 237   Cardiac Enzymes: No results for input(s): CKTOTAL, CKMB, CKMBINDEX, TROPONINI in the last 168 hours. BNP: BNP (last 3 results) No results for input(s): BNP in the last 8760 hours.  ProBNP (last 3 results) No results for input(s): PROBNP in the last 8760 hours.  CBG: No results for input(s): GLUCAP in the last 168 hours.     Signed:  Silvano Bilis MD.  [facility] 04/05/2020, 4:12 PM

## 2020-04-05 NOTE — Plan of Care (Signed)
Discharge teaching completed with patient who is in stable condition. 

## 2020-04-05 NOTE — Progress Notes (Signed)
Alyssa Minium, MD [facility]   [address] [place], [place] [address] Phone: [phone] Fax : [phone]   Subjective: The patient denies any abdominal pain today.  She states she is not hungry and her food is sitting on her tray.  The patient has had no bowel movements today.  She was resting comfortably and sleeping when I came to see her yesterday.  Patient sodium is up to 133 today.   Objective: Vital signs in last 24 hours: Vitals:   04/04/20 2100 04/04/20 2140 04/05/20 0548 04/05/20 0858  BP: (!) 132/59 (!) 157/79 125/70 118/67  Pulse: 77 74 63 75  Resp: 16 16 16 16   Temp:  97.7 F (36.5 C) 97.7 F (36.5 C) 98.2 F (36.8 C)  TempSrc:  Oral Oral Oral  SpO2: 96% 98% 98% 96%  Weight:  64.9 kg    Height:  5\' 1"  (1.549 m)     Weight change: -0.862 kg  Intake/Output Summary (Last 24 hours) at 04/05/2020 1252 Last data filed at 04/05/2020 0900 Gross per 24 hour  Intake 360 ml  Output --  Net 360 ml     Exam: Heart:: Regular rate and rhythm, S1S2 present or without murmur or extra heart sounds Lungs: normal and clear to auscultation and percussion Abdomen: soft, nontender, normal bowel sounds   Lab Results: @LABTEST2 @ Micro Results: Recent Results (from the past 240 hour(s))  SARS Coronavirus 2 by RT PCR (hospital order, performed in [facility] hospital lab) Nasopharyngeal Nasopharyngeal Swab     Status: None   Collection Time: 04/03/20  7:17 PM   Specimen: Nasopharyngeal Swab  Result Value Ref Range Status   SARS Coronavirus 2 NEGATIVE NEGATIVE Final    Comment: (NOTE) SARS-CoV-2 target nucleic acids are NOT DETECTED.  The SARS-CoV-2 RNA is generally detectable in upper and lower respiratory specimens during the acute phase of infection. The lowest concentration of SARS-CoV-2 viral copies this assay can detect is 250 copies / mL. A negative result does not preclude SARS-CoV-2 infection and should not be used as the sole basis for treatment or  other patient management decisions.  A negative result may occur with improper specimen collection / handling, submission of specimen other than nasopharyngeal swab, presence of viral mutation(s) within the areas targeted by this assay, and inadequate number of viral copies (<250 copies / mL). A negative result must be combined with clinical observations, patient history, and epidemiological information.  Fact Sheet for Patients:    Fact Sheet for Healthcare Providers: [facility]  This test is not yet approved or  cleared by the 04/05/20 FDA and has been authorized for detection and/or diagnosis of SARS-CoV-2 by FDA under an Emergency Use Authorization (EUA).  This EUA will remain in effect (meaning this test can be used) for the duration of the COVID-19 declaration under Section 564(b)(1) of the Act, 21 U.S.C. section 360bbb-3(b)(1), unless the authorization is terminated or revoked sooner.  Performed at [facility], [address] [place], [facility] [address]    Studies/Results: CT ABDOMEN PELVIS W CONTRAST  Result Date: 04/03/2020 CLINICAL DATA:  Left lower quadrant abdominal pain radiating to left flank EXAM: CT ABDOMEN AND PELVIS WITH CONTRAST TECHNIQUE: Multidetector CT imaging of the abdomen and pelvis was performed using the standard protocol following bolus administration of intravenous contrast. CONTRAST:  [place] OMNIPAQUE IOHEXOL 300 MG/ML  SOLN COMPARISON:  Hepatobiliary scan 09/16/2019, ultrasound 09/16/2018 FINDINGS: Lower chest: 8 mm grounds 5 granuloma seen in the left lung base.  Bandlike area of scarring in the medial left base as well. Lung bases are otherwise clear. Normal heart size. No pericardial effusion. Hepatobiliary: There is some geographic hypoattenuation along the falciform ligament in the location quite typical for focal fatty infiltration. Small hypoattenuating  subcentimeter foci in the liver (2/31, 24) too small to fully characterize on CT imaging but statistically likely benign. No concerning focal liver lesions. Smooth liver surface contour. Normal gallbladder and biliary tree. Pancreas: Unremarkable. No pancreatic ductal dilatation or surrounding inflammatory changes. Spleen: Normal in size. No concerning splenic lesions. Adrenals/Urinary Tract: Normal adrenal glands. Kidneys are normally located with symmetric enhancement and excretion. No suspicious renal lesion, urolithiasis or hydronephrosis. Urinary bladder is unremarkable. Stomach/Bowel: Esophagus, stomach and duodenum are free of acute abnormality. No small bowel thickening or dilatation. A normal appendix is visualized. Question some mild colonic thickening versus underdistention from the mid transverse through rectosigmoid colon. Faint pericolonic haze is suspected. Scattered colonic diverticula without focal inflammation to suggest diverticulitis. No evidence of bowel obstruction Vascular/Lymphatic: Atherosclerotic calcifications within the abdominal aorta and branch vessels. No aneurysm or ectasia. No enlarged abdominopelvic lymph nodes. Reproductive: Anteverted uterus. Rounded hypodense focus towards the level of the cervix measuring up to 11 mm could reflect a small nabothian cyst. No worrisome adnexal lesions. Other: No abdominopelvic free fluid or free gas. No bowel containing hernias. Small fat containing umbilical hernia. Few calcified injection granulomata posteriorly. Musculoskeletal: Levocurvature of the lumbar spine, apex L3-4. Multilevel degenerative changes are present in the imaged portions of the spine. No acute osseous abnormality or suspicious osseous lesion. IMPRESSION: 1. Questionable colonic thickening versus underdistention from the mid transverse through rectosigmoid colon with faint pericolonic haze, suggestive of mild colitis. Correlate with exam findings. 2. Colonic diverticulosis  without evidence of acute diverticulitis. 3. Rounded hypodense focus towards the level of the cervix measuring up to 11 mm could reflect a small nabothian cyst. 4. Aortic Atherosclerosis (ICD10-I70.0). Electronically Signed   By: Kreg Shropshire M.D.   On: 04/03/2020 19:43   Medications: I have reviewed the patient's current medications. Scheduled Meds: . enoxaparin (LOVENOX) injection  40 mg Subcutaneous Q24H  . pantoprazole  40 mg Oral Daily   Continuous Infusions: PRN Meds:.acetaminophen **OR** acetaminophen, HYDROcodone-acetaminophen, ondansetron **OR** ondansetron (ZOFRAN) IV, oxyCODONE   Assessment: Active Problems:   Hyponatremia   Colitis    Plan: The patient is devoid of any abdominal pain today and states she is doing well.  She was resting comfortably when I came into the room and denies having a bowel movement today.  The patient has not eaten any of her food and states that she has a chronic decreased appetite.  The patient should follow-up with her primary gastroenterologist for her upper endoscopy and colonoscopy in early October.  Nothing further to do from a GI point of view.  I will sign off.  Please call if any further GI concerns or questions.  We would like to thank you for the opportunity to participate in the care of Alyssa Vega.     LOS: 2 days   Sherlyn Hay 04/05/2020, 12:52 PM Pager [phone] 7am-5pm  Check AMION for 5pm -7am coverage and on weekends

## 2020-04-05 NOTE — Progress Notes (Signed)
PROGRESS NOTE    Alyssa Vega  KNL:[id number] DOB: 11/27/1964 DOA: 04/02/2020 PCP: Patient, No Pcp Per  Outpatient Specialists:     Brief Narrative:  Alyssa Vega is a 64 y.o. female with medical history significant for chronic back pain and several month history of recurring abdominal pain scheduled for panendoscopy in October, who presents to the emergency room with acute worsening of abdominal pain in the past 24 hours.  She states that the pain suddenly worsened and was associated with nausea.  It is felt in the entire lower abdomen but worse on the left side.  She denies vomiting or diarrhea.  Denies fever or chills, dysuria or change in bowel habits.  Patient saw her PCP the day prior for an annual visit and was doing well at the time and her only complaint was for low back pain.  She said she was started on Cymbalta 30 mg, which was added to her usual Robaxin and gabapentin.  She stated that she took 1 dose last night but it made her feel very sick with tingling and nausea.  She had routine blood work as part of her physical exam at her doctor's office which were within normal limits.  ED Course: On arrival, vitals were within normal limits.  Blood work concerning for sodium of 120, down from 138 at her doctor's appointment the day prior.  On repeat sodium 124, potassium 3.3.  CT abdomen and pelvis showed mild colitis.  Hospitalist consulted for admission.   Assessment & Plan:   Principal Problem:   Colon wall thickening Active Problems:   URI (upper respiratory infection)   History of lung cancer   Hypoxia  64 year old female with history of chronic back pain presenting with acute on chronic abdominal pain with finding of hyponatremia and colitis on CT   Hyponatremia - acute -Sodium 120 on arrival, down from 138 the day prior. Now improved to 133 today -asymptomatic -possible caused by duloxetine but had just one dose. Chronic gabapentin could contribute -studies  suggesting siadh, possible adrenal insufficiency. TSH wnl. morning cosyntropin test performed, results pending -Serial sodium -appreciate nephrology recs  Colitis -Patient presenting with acute on chronic abdominal pain with finding of mild colitis on CT abdomen and pelvis but without change in bowel habits, no elevated wbc count -- will check hemoccult -appreciate GI recs, advises no further treatment (will d/c flagyl), f/u as outpatient for previously scheduled egd/colonoscopy  Chronic back pain - stable, no red flag symptoms, worsened by lying on hospital bed -hold home gaba, start oxy prn   DVT prophylaxis: lovenox Code Status: full Family Communication: none The patient is from: home Anticipated d/c is to: home Anticipated d/c date is: 9/20 Patient currently is not medically stable to d/c due to: need for inpatient monitoring and evaluation  Consultants:   GI, nephrology  Procedures:   none  Antimicrobials:   Flagyl 9/17 > 9/18   Subjective: Abd pain intermittent and stable. Appetite fair. Back pain improved w/ new med. No nausea. No headache or vision changes. No le weakness ornumbness or loss of bowel/bladder  Objective: Vitals:   04/03/20 0430 04/03/20 0500 04/03/20 0530 04/03/20 0600  BP: 128/79 119/81 130/87 119/82  Pulse:   (!) 119 (!) 114  Resp: 19 18 (!) 21 19  Temp:      TempSrc:      SpO2:   93% 93%  Weight:      Height:        Intake/Output  Summary (Last 24 hours) at 04/03/2020 1328 Last data filed at 04/03/2020 1000 Gross per 24 hour  Intake 360 ml  Output --  Net 360 ml   Filed Weights   04/02/20 0345 04/02/20 1132  Weight: 90.7 kg 90 kg    Examination:  General exam: Appears calm and comfortable  Respiratory system: Clear to auscultation. Respiratory effort normal. Cardiovascular system: S1 & S2 heard, RRR. No JVD, murmurs, rubs, gallops or clicks. No pedal edema. Gastrointestinal system: Abdomen is nondistended, soft and  nontender. No organomegaly or masses felt. Normal bowel sounds heard. Central nervous system: Alert and oriented. No focal neurological deficits. Extremities: Symmetric 5 x 5 power. Skin: No rashes, lesions or ulcers Psychiatry: Judgement and insight appear normal. Mood & affect appropriate.     Data Reviewed: I have personally reviewed following labs and imaging studies  CBC: Recent Labs  Lab 04/01/20 0908 04/02/20 0349 04/03/20 0420  WBC 6.5 8.3 7.7  NEUTROABS 4.9  --   --   HGB 14.9 15.0 14.0  HCT 44.5 43.4 41.4  MCV 90.4 88.6 92.0  PLT 181 201 189   Basic Metabolic Panel: Recent Labs  Lab 04/01/20 0908 04/02/20 0349 04/03/20 0420  NA 139 140 141  K 4.1 3.6 4.2  CL 101 104 103  CO2 28 25 27   GLUCOSE 109* 117* 107*  BUN 7 14 12   CREATININE 0.95 0.92 0.93  CALCIUM 8.7* 8.9 8.2*  MG 1.9  --   --    GFR: Estimated Creatinine Clearance: 93.2 mL/min (by C-G formula based on SCr of 0.93 mg/dL). Liver Function Tests: Recent Labs  Lab 04/01/20 0908 04/02/20 0349 04/03/20 0420  AST 19 24 22   ALT 17 21 19   ALKPHOS 78 74 66  BILITOT 1.0 0.8 0.9  PROT 7.4 7.5 7.1  ALBUMIN 4.3 4.1 3.9   Recent Labs  Lab 04/02/20 0349  LIPASE 24   No results for input(s): AMMONIA in the last 168 hours. Coagulation Profile: No results for input(s): INR, PROTIME in the last 168 hours. Cardiac Enzymes: No results for input(s): CKTOTAL, CKMB, CKMBINDEX, TROPONINI in the last 168 hours. BNP (last 3 results) No results for input(s): PROBNP in the last 8760 hours. HbA1C: No results for input(s): HGBA1C in the last 72 hours. CBG: No results for input(s): GLUCAP in the last 168 hours. Lipid Profile: No results for input(s): CHOL, HDL, LDLCALC, TRIG, CHOLHDL, LDLDIRECT in the last 72 hours. Thyroid Function Tests: No results for input(s): TSH, T4TOTAL, FREET4, T3FREE, THYROIDAB in the last 72 hours. Anemia Panel: No results for input(s): VITAMINB12, FOLATE, FERRITIN, TIBC, IRON,  RETICCTPCT in the last 72 hours. Urine analysis:    Component Value Date/Time   COLORURINE YELLOW (A) 04/02/2020 0349   APPEARANCEUR HAZY (A) 04/02/2020 0349   APPEARANCEUR Clear 04/15/2015 1500   LABSPEC 1.029 04/02/2020 0349   PHURINE 5.0 04/02/2020 0349   GLUCOSEU NEGATIVE 04/02/2020 0349   HGBUR NEGATIVE 04/02/2020 0349   BILIRUBINUR NEGATIVE 04/02/2020 0349   BILIRUBINUR Negative 04/15/2015 1500   KETONESUR NEGATIVE 04/02/2020 0349   PROTEINUR 30 (A) 04/02/2020 0349   NITRITE NEGATIVE 04/02/2020 0349   LEUKOCYTESUR NEGATIVE 04/02/2020 0349   Sepsis Labs: @LABRCNTIP (procalcitonin:4,lacticidven:4)  ) Recent Results (from the past 240 hour(s))  SARS Coronavirus 2 by RT PCR (hospital order, [facility]) Nasopharyngeal Nasopharyngeal Swab     Status: None   Collection Time: 04/01/20  8:43 AM   Specimen: Nasopharyngeal Swab  Result Value Ref Range  Status   SARS Coronavirus 2 NEGATIVE NEGATIVE Final    Comment: (NOTE) SARS-CoV-2 target nucleic acids are NOT DETECTED.  The SARS-CoV-2 RNA is generally detectable in upper and lower respiratory specimens during the acute phase of infection. The lowest concentration of SARS-CoV-2 viral copies this assay can detect is 250 copies / mL. A negative result does not preclude SARS-CoV-2 infection and should not be used as the sole basis for treatment or other patient management decisions.  A negative result may occur with improper specimen collection / handling, submission of specimen other than nasopharyngeal swab, presence of viral mutation(s) within the areas targeted by this assay, and inadequate number of viral copies (<250 copies / mL). A negative result must be combined with clinical observations, patient history, and epidemiological information.  Fact Sheet for Patients:   BoilerBrush.com.cy  Fact Sheet for Healthcare Providers: https://pope.com/  This  test is not yet approved or  cleared by the [place] FDA and has been authorized for detection and/or diagnosis of SARS-CoV-2 by FDA under an Emergency Use Authorization (EUA).  This EUA will remain in effect (meaning this test can be used) for the duration of the COVID-19 declaration under Section 564(b)(1) of the Act, 21 U.S.C. section 360bbb-3(b)(1), unless the authorization is terminated or revoked sooner.  Performed at [facility], [address] [place], [place] [address]   SARS Coronavirus 2 by RT PCR (hospital order, performed in [facility] hospital lab) Nasopharyngeal Nasopharyngeal Swab     Status: None   Collection Time: 04/02/20  4:09 PM   Specimen: Nasopharyngeal Swab  Result Value Ref Range Status   SARS Coronavirus 2 NEGATIVE NEGATIVE Final    Comment: (NOTE) SARS-CoV-2 target nucleic acids are NOT DETECTED.  The SARS-CoV-2 RNA is generally detectable in upper and lower respiratory specimens during the acute phase of infection. The lowest concentration of SARS-CoV-2 viral copies this assay can detect is 250 copies / mL. A negative result does not preclude SARS-CoV-2 infection and should not be used as the sole basis for treatment or other patient management decisions.  A negative result may occur with improper specimen collection / handling, submission of specimen other than nasopharyngeal swab, presence of viral mutation(s) within the areas targeted by this assay, and inadequate number of viral copies (<250 copies / mL). A negative result must be combined with clinical observations, patient history, and epidemiological information.  Fact Sheet for Patients:   BoilerBrush.com.cy  Fact Sheet for Healthcare Providers: https://pope.com/  This test is not yet approved or  cleared by the [place] FDA and has been authorized for detection and/or diagnosis of SARS-CoV-2 by FDA under an Emergency Use  Authorization (EUA).  This EUA will remain in effect (meaning this test can be used) for the duration of the COVID-19 declaration under Section 564(b)(1) of the Act, 21 U.S.C. section 360bbb-3(b)(1), unless the authorization is terminated or revoked sooner.  Performed at [facility], [address] [place], [place] [address]          Radiology Studies: DG Abd 1 View  Result Date: 04/03/2020 CLINICAL DATA:  : Obstruction EXAM: ABDOMEN - 1 VIEW COMPARISON:  04/02/2020 CT FINDINGS: Continued bowel dilatation most notable throughout the colon into the distal descending colon. Small bowel dilatation also noted. Findings could reflect ileus or distal colonic obstruction. Contrast material seen within the bladder. No organomegaly or free air. IMPRESSION: Continued bowel distension, most notable within the colon to the level of the distal descending colon. This  could reflect ileus or distal colonic obstruction. Electronically Signed   By: Charlett Nose M.D.   On: 04/03/2020 11:25   CT CHEST ABDOMEN PELVIS W CONTRAST  Result Date: 04/02/2020 CLINICAL DATA:  Cough, acute generalized abdominal pain. EXAM: CT CHEST, ABDOMEN, AND PELVIS WITH CONTRAST TECHNIQUE: Multidetector CT imaging of the chest, abdomen and pelvis was performed following the standard protocol during bolus administration of intravenous contrast. CONTRAST:  OMNIPAQUE IOHEXOL 300 MG/ML  SOLN COMPARISON:  Dec 03, 2019.  June 24, 2019. FINDINGS: CT CHEST FINDINGS Cardiovascular: Normal cardiac size. No pericardial effusion. No evidence of thoracic aortic dissection or aneurysm. Status post internal mammary bypass graft. Mediastinum/Nodes: No enlarged mediastinal, hilar, or axillary lymph nodes. Thyroid gland, trachea, and esophagus demonstrate no significant findings. Lungs/Pleura: No pneumothorax or pleural effusion is noted. 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 Right lung is unremarkable. Musculoskeletal: No chest wall mass or suspicious bone lesions identified. CT ABDOMEN PELVIS FINDINGS Hepatobiliary: No focal liver abnormality is seen. No gallstones, gallbladder wall thickening, or biliary dilatation. Pancreas: Unremarkable. No pancreatic ductal dilatation or surrounding inflammatory changes. Spleen: Normal in size without focal abnormality. Adrenals/Urinary Tract: Adrenal glands are unremarkable. Kidneys are normal, without renal calculi, focal lesion, or hydronephrosis. Bladder is unremarkable. Stomach/Bowel: The stomach appears normal. No small bowel dilatation is noted. The appendix is not visualized. Air-filled and dilated transverse and right colon is noted as well as some dilatation of distal descending colon. Stool is noted in the sigmoid colon and rectum. There is noted irregular wall thickening involving a portion of the splenic flexure and proximal descending colon which may represent scarring from prior inflammation, but colonoscopy is recommended to rule out possible neoplasm. Vascular/Lymphatic: Aortic atherosclerosis. No enlarged abdominal or pelvic lymph nodes. Reproductive: Stable mild prostatic enlargement is noted. Other: No abdominal wall hernia or abnormality. No abdominopelvic ascites. Musculoskeletal: No acute or significant osseous findings. IMPRESSION: 1. Air-filled and dilated transverse and right colon is noted as well as some dilatation of distal descending colon. There is noted irregular wall thickening involving a portion of the splenic flexure and proximal descending colon which may represent scarring from prior inflammation, but colonoscopy is recommended to rule out possible neoplasm. 2. Stable mild prostatic enlargement. 3. 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 4. Aortic atherosclerosis. Aortic  Atherosclerosis (ICD10-I70.0). Electronically Signed   By: Lupita Raider M.D.   On: 04/02/2020 14:00        Scheduled Meds: . albuterol  2.5 mg Nebulization Q6H  . predniSONE  40 mg Oral Q breakfast   Continuous Infusions: . sodium chloride       LOS: 1 day    Time spent: 50 min    Silvano Bilis, MD [facility]  If 7PM-7AM, please contact night-coverage [email or web address] Password [email or web address] 04/03/2020, 1:28 PM

## 2020-04-16 ENCOUNTER — Other Ambulatory Visit: Payer: Self-pay

## 2020-04-16 ENCOUNTER — Other Ambulatory Visit
Admission: RE | Admit: 2020-04-16 | Discharge: 2020-04-16 | Disposition: A | Discharge status: Home / Self Care | Payer: Medicare Other | Source: Ambulatory Visit | Attending: Gastroenterology | Admitting: Gastroenterology

## 2020-04-16 DIAGNOSIS — Z01812 Encounter for preprocedural laboratory examination: Secondary | ICD-10-CM | POA: Diagnosis present

## 2020-04-16 DIAGNOSIS — Z20822 Contact with and (suspected) exposure to covid-19: Secondary | ICD-10-CM | POA: Insufficient documentation

## 2020-04-16 LAB — SARS CORONAVIRUS 2 (TAT 6-24 HRS): SARS Coronavirus 2: NEGATIVE

## 2020-04-20 ENCOUNTER — Encounter: Payer: Self-pay | Admitting: *Deleted

## 2020-04-20 ENCOUNTER — Encounter
Admission: RE | Disposition: A | Discharge status: Home / Self Care | Payer: Self-pay | Source: Home / Self Care | Attending: Gastroenterology

## 2020-04-20 ENCOUNTER — Ambulatory Visit
Admission: RE | Admit: 2020-04-20 | Discharge: 2020-04-20 | Disposition: A | Discharge status: Home / Self Care | Payer: Medicare Other | Attending: Gastroenterology | Admitting: Gastroenterology

## 2020-04-20 ENCOUNTER — Ambulatory Visit: Payer: Medicare Other | Admitting: Anesthesiology

## 2020-04-20 DIAGNOSIS — K5730 Diverticulosis of large intestine without perforation or abscess without bleeding: Secondary | ICD-10-CM | POA: Insufficient documentation

## 2020-04-20 DIAGNOSIS — K2950 Unspecified chronic gastritis without bleeding: Secondary | ICD-10-CM | POA: Diagnosis not present

## 2020-04-20 DIAGNOSIS — Z886 Allergy status to analgesic agent status: Secondary | ICD-10-CM | POA: Insufficient documentation

## 2020-04-20 DIAGNOSIS — R110 Nausea: Secondary | ICD-10-CM | POA: Insufficient documentation

## 2020-04-20 DIAGNOSIS — Z8619 Personal history of other infectious and parasitic diseases: Secondary | ICD-10-CM | POA: Diagnosis not present

## 2020-04-20 DIAGNOSIS — M1990 Unspecified osteoarthritis, unspecified site: Secondary | ICD-10-CM | POA: Insufficient documentation

## 2020-04-20 DIAGNOSIS — Z7982 Long term (current) use of aspirin: Secondary | ICD-10-CM | POA: Insufficient documentation

## 2020-04-20 DIAGNOSIS — Z888 Allergy status to other drugs, medicaments and biological substances status: Secondary | ICD-10-CM | POA: Diagnosis not present

## 2020-04-20 DIAGNOSIS — K640 First degree hemorrhoids: Secondary | ICD-10-CM | POA: Insufficient documentation

## 2020-04-20 DIAGNOSIS — R1084 Generalized abdominal pain: Secondary | ICD-10-CM | POA: Diagnosis present

## 2020-04-20 DIAGNOSIS — Z79899 Other long term (current) drug therapy: Secondary | ICD-10-CM | POA: Diagnosis not present

## 2020-04-20 HISTORY — PX: ESOPHAGOGASTRODUODENOSCOPY (EGD) WITH PROPOFOL: SHX5813

## 2020-04-20 HISTORY — PX: COLONOSCOPY WITH PROPOFOL: SHX5780

## 2020-04-20 SURGERY — COLONOSCOPY WITH PROPOFOL
Anesthesia: General

## 2020-04-20 MED ORDER — PROPOFOL 500 MG/50ML IV EMUL
INTRAVENOUS | Status: AC
  Filled 2020-04-20: qty 50

## 2020-04-20 MED ORDER — LIDOCAINE HCL (CARDIAC) PF 100 MG/5ML IV SOSY
PREFILLED_SYRINGE | INTRAVENOUS | Status: DC | PRN
  Administered 2020-04-20: 50 mg via INTRAVENOUS

## 2020-04-20 MED ORDER — PROPOFOL 500 MG/50ML IV EMUL
INTRAVENOUS | Status: DC | PRN
  Administered 2020-04-20: 150 ug/kg/min via INTRAVENOUS

## 2020-04-20 MED ORDER — SODIUM CHLORIDE 0.9 % IV SOLN
INTRAVENOUS | Status: DC
  Administered 2020-04-20: 1000 mL via INTRAVENOUS

## 2020-04-20 MED ORDER — PROPOFOL 10 MG/ML IV BOLUS
INTRAVENOUS | Status: DC | PRN
  Administered 2020-04-20: 100 mg via INTRAVENOUS

## 2020-04-20 NOTE — Op Note (Signed)
[facility] Gastroenterology Patient Name: Alyssa Vega Procedure Date: 04/20/2020 1:03 PM MRN: [id number] Account #: [id number] Date of Birth: Nov 01, 1955 Admit Type: Outpatient Age: 64 Room: [facility] ENDO ROOM 1 Gender: Female Note Status: Finalized Procedure:             Upper GI endoscopy Indications:           Generalized abdominal pain, Anorexia, Nausea Providers:             Andrey Farmer MD, MD Referring MD:          Dion Body (Referring MD) Medicines:             Monitored Anesthesia Care Complications:         No immediate complications. Estimated blood loss:                         Minimal. Procedure:             Pre-Anesthesia Assessment:                        - Prior to the procedure, a History and Physical was                         performed, and patient medications and allergies were                         reviewed. The patient is competent. The risks and                         benefits of the procedure and the sedation options and                         risks were discussed with the patient. All questions                         were answered and informed consent was obtained.                         Patient identification and proposed procedure were                         verified by the physician, the nurse, the anesthetist                         and the technician in the endoscopy suite. Mental                         Status Examination: alert and oriented. Airway                         Examination: normal oropharyngeal airway and neck                         mobility. Respiratory Examination: clear to                         auscultation. CV Examination: normal. Prophylactic  Antibiotics: The patient does not require prophylactic                         antibiotics. Prior Anticoagulants: The patient has                         taken no previous anticoagulant or antiplatelet                         agents.  ASA Grade Assessment: II - A patient with mild                         systemic disease. After reviewing the risks and                         benefits, the patient was deemed in satisfactory                         condition to undergo the procedure. The anesthesia                         plan was to use monitored anesthesia care (MAC).                         Immediately prior to administration of medications,                         the patient was re-assessed for adequacy to receive                         sedatives. The heart rate, respiratory rate, oxygen                         saturations, blood pressure, adequacy of pulmonary                         ventilation, and response to care were monitored                         throughout the procedure. The physical status of the                         patient was re-assessed after the procedure.                        After obtaining informed consent, the endoscope was                         passed under direct vision. Throughout the procedure,                         the patient's blood pressure, pulse, and oxygen                         saturations were monitored continuously. The Endoscope                         was introduced through the mouth, and advanced to the  second part of duodenum. The upper GI endoscopy was                         accomplished without difficulty. The patient tolerated                         the procedure well. Findings:      The examined esophagus was normal.      The entire examined stomach was normal. Biopsies were taken with a cold       forceps for Helicobacter pylori testing. Estimated blood loss was       minimal.      The examined duodenum was normal. Impression:            - Normal esophagus.                        - Normal stomach. Biopsied.                        - Normal examined duodenum. Recommendation:        - Discharge patient to home.                        -  Resume previous diet.                        - Continue present medications.                        - Await pathology results.                        - Return to referring physician as previously                         scheduled. Procedure Code(s):     --- Professional ---                        [phone], Esophagogastroduodenoscopy, flexible,                         transoral; with biopsy, single or multiple Diagnosis Code(s):     --- Professional ---                        R10.84, Generalized abdominal pain                        R63.0, Anorexia                        R11.0, Nausea CPT copyright 2019 American Medical Association. All rights reserved. The codes documented in this report are preliminary and upon coder review may  be revised to meet current compliance requirements. Andrey Farmer, MD Andrey Farmer MD, MD 04/20/2020 1:34:20 PM Number of Addenda: 0 Note Initiated On: 04/20/2020 1:03 PM Estimated Blood Loss:  Estimated blood loss was minimal.      [facility]

## 2020-04-20 NOTE — Op Note (Signed)
[facility] Gastroenterology Patient Name: Alyssa Vega Procedure Date: 04/20/2020 1:02 PM MRN: [id number] Account #: [id number] Date of Birth: 03/17/1956 Admit Type: Outpatient Age: 64 Room: [facility] ENDO ROOM 1 Gender: Female Note Status: Finalized Procedure:             Colonoscopy Indications:           Generalized abdominal pain, Abnormal CT of the GI tract Providers:             Andrey Farmer MD, MD Referring MD:          Dion Body (Referring MD) Medicines:             Monitored Anesthesia Care Complications:         No immediate complications. Procedure:             Pre-Anesthesia Assessment:                        - Prior to the procedure, a History and Physical was                         performed, and patient medications and allergies were                         reviewed. The patient is competent. The risks and                         benefits of the procedure and the sedation options and                         risks were discussed with the patient. All questions                         were answered and informed consent was obtained.                         Patient identification and proposed procedure were                         verified by the physician, the nurse, the anesthetist                         and the technician in the endoscopy suite. Mental                         Status Examination: alert and oriented. Airway                         Examination: normal oropharyngeal airway and neck                         mobility. Respiratory Examination: clear to                         auscultation. CV Examination: normal. Prophylactic                         Antibiotics: The patient does not require prophylactic  antibiotics. Prior Anticoagulants: The patient has                         taken no previous anticoagulant or antiplatelet                         agents. ASA Grade Assessment: II - A patient with mild                          systemic disease. After reviewing the risks and                         benefits, the patient was deemed in satisfactory                         condition to undergo the procedure. The anesthesia                         plan was to use monitored anesthesia care (MAC).                         Immediately prior to administration of medications,                         the patient was re-assessed for adequacy to receive                         sedatives. The heart rate, respiratory rate, oxygen                         saturations, blood pressure, adequacy of pulmonary                         ventilation, and response to care were monitored                         throughout the procedure. The physical status of the                         patient was re-assessed after the procedure.                        After obtaining informed consent, the colonoscope was                         passed under direct vision. Throughout the procedure,                         the patient's blood pressure, pulse, and oxygen                         saturations were monitored continuously. The                         Colonoscope was introduced through the anus and                         advanced to the the terminal ileum. The colonoscopy  was performed without difficulty. The patient                         tolerated the procedure well. The quality of the bowel                         preparation was excellent. Findings:      The perianal and digital rectal examinations were normal.      The terminal ileum appeared normal.      A few small-mouthed diverticula were found in the sigmoid colon,       descending colon and transverse colon.      Non-bleeding internal hemorrhoids were found during retroflexion. The       hemorrhoids were Grade I (internal hemorrhoids that do not prolapse).      The exam was otherwise without abnormality on direct and retroflexion        views. Impression:            - The examined portion of the ileum was normal.                        - Diverticulosis in the sigmoid colon, in the                         descending colon and in the transverse colon.                        - Non-bleeding internal hemorrhoids.                        - The examination was otherwise normal on direct and                         retroflexion views.                        - No specimens collected. Recommendation:        - Discharge patient to home.                        - Resume previous diet.                        - Continue present medications.                        - Repeat colonoscopy in 10 years for screening                         purposes.                        - Return to referring physician as previously                         scheduled. Procedure Code(s):     --- Professional ---                        [id number], Colonoscopy, flexible; diagnostic, including  collection of specimen(s) by brushing or washing, when                         performed (separate procedure) Diagnosis Code(s):     --- Professional ---                        K64.0, First degree hemorrhoids                        R10.84, Generalized abdominal pain                        K57.30, Diverticulosis of large intestine without                         perforation or abscess without bleeding                        R93.3, Abnormal findings on diagnostic imaging of                         other parts of digestive tract CPT copyright 2019 [employer]. All rights reserved. The codes documented in this report are preliminary and upon coder review may  be revised to meet current compliance requirements. Andrey Farmer, MD Andrey Farmer MD, MD 04/20/2020 1:39:14 PM Number of Addenda: 0 Note Initiated On: 04/20/2020 1:02 PM Scope Withdrawal Time: 0 hours 8 minutes 0 seconds  Total Procedure Duration: 0 hours 11 minutes 20  seconds  Estimated Blood Loss:  Estimated blood loss: none.      [facility]

## 2020-04-20 NOTE — Interval H&P Note (Signed)
History and Physical Interval Note:  04/20/2020 12:57 PM  Alyssa Vega  has presented today for surgery, with the diagnosis of gen abd pain nausea.  The various methods of treatment have been discussed with the patient and family. After consideration of risks, benefits and other options for treatment, the patient has consented to  Procedure(s): COLONOSCOPY WITH PROPOFOL (N/A) ESOPHAGOGASTRODUODENOSCOPY (EGD) WITH PROPOFOL (N/A) as a surgical intervention.  The patient's history has been reviewed, patient examined, no change in status, stable for surgery.  I have reviewed the patient's chart and labs.  Questions were answered to the patient's satisfaction.     Regis Bill  Ok to proceed with EGD/Colonoscopy

## 2020-04-20 NOTE — Anesthesia Postprocedure Evaluation (Signed)
Anesthesia Post Note  Patient: Syra A Koren  Procedure(s) Performed: COLONOSCOPY WITH PROPOFOL (N/A ) ESOPHAGOGASTRODUODENOSCOPY (EGD) WITH PROPOFOL (N/A )  Patient location during evaluation: Endoscopy Anesthesia Type: General Level of consciousness: awake and alert and oriented Pain management: pain level controlled Vital Signs Assessment: post-procedure vital signs reviewed and stable Respiratory status: spontaneous breathing, nonlabored ventilation and respiratory function stable Cardiovascular status: blood pressure returned to baseline and stable Postop Assessment: no signs of nausea or vomiting Anesthetic complications: no   No complications documented.   Last Vitals:  Vitals:   04/20/20 1333 04/20/20 1343  BP: (!) 111/55 129/86  Pulse: 72 67  Resp: (!) 23 14  Temp:    SpO2: 100% 100%    Last Pain:  Vitals:   04/20/20 1343  TempSrc:   PainSc: 0-No pain                 Martyn Timme

## 2020-04-20 NOTE — Transfer of Care (Signed)
Immediate Anesthesia Transfer of Care Note  Patient: Alyssa Vega  Procedure(s) Performed: COLONOSCOPY WITH PROPOFOL (N/A ) ESOPHAGOGASTRODUODENOSCOPY (EGD) WITH PROPOFOL (N/A )  Patient Location: PACU  Anesthesia Type:MAC  Level of Consciousness: awake, alert , oriented and drowsy  Airway & Oxygen Therapy: Patient Spontanous Breathing  Post-op Assessment: Report given to RN and Post -op Vital signs reviewed and stable  Post vital signs: stable  Last Vitals:  Vitals Value Taken Time  BP 111/55 04/20/20 1333  Temp    Pulse 72 04/20/20 1333  Resp 23 04/20/20 1333  SpO2 100 % 04/20/20 1333    Last Pain:  Vitals:   04/20/20 1333  TempSrc:   PainSc: 0-No pain         Complications: No complications documented.

## 2020-04-20 NOTE — H&P (Signed)
Outpatient short stay form Pre-procedure 04/20/2020 12:53 PM Merlyn Lot MD, MPH  Primary Physician: Dr. Burnadette Pop  Reason for visit:  Abdominal pain  History of present illness:   64 y/o lady with history of H pylori here for EGD/Colonoscopy for persistent abdominal pain and also abnormal imaging showing colonic thickening. No family history of GI malignancies, no abdominal surgeries, no blood thinners.    Current Facility-Administered Medications:  .  0.9 %  sodium chloride infusion, , Intravenous, Continuous, Attila Mccarthy, Rossie Muskrat, MD, Last Rate: 20 mL/hr at 04/20/20 1253, 1,000 mL at 04/20/20 1253  Medications Prior to Admission  Medication Sig Dispense Refill Last Dose  . Aspirin-Caffeine (ANACIN) 400-32 MG TABS Take 2 tablets by mouth daily as needed.   Past Month at Unknown time  . gabapentin (NEURONTIN) 100 MG capsule Take 100 mg by mouth at bedtime.   Past Month at Unknown time  . glucosamine-chondroitin 500-400 MG tablet Take 1 tablet by mouth daily.   Past Month at Unknown time  . methocarbamol (ROBAXIN) 500 MG tablet Take 500 mg by mouth every 8 (eight) hours as needed for muscle spasms.    Past Month at Unknown time  . Multiple Vitamin (MULTIVITAMIN) tablet Take 1 tablet by mouth daily.   Past Week at Unknown time  . naproxen sodium (ALEVE) 220 MG tablet Take 220 mg by mouth 2 (two) times daily as needed.   Past Month at Unknown time  . omeprazole (PRILOSEC) 40 MG capsule Take 40 mg by mouth in the morning and at bedtime.   Past Week at Unknown time  . vitamin E (VITAMIN E) 180 MG (400 UNITS) capsule Take 400 Units by mouth daily.   Past Month at Unknown time  . ezetimibe (ZETIA) 10 MG tablet Take 10 mg by mouth daily. (Patient not taking: Reported on 04/20/2020)   Not Taking at Unknown time  . traMADol (ULTRAM) 50 MG tablet Take by mouth 2 (two) times daily as needed. (Patient not taking: Reported on 04/20/2020)   Not Taking at Unknown time     Allergies  Allergen  Reactions  . Duloxetine     hyponatremia  . Atorvastatin Itching  . Cyclobenzaprine Other (See Comments)    Sores in mouth/throat  . Ibuprofen Other (See Comments)    Mouth sores  . Melatonin Other (See Comments)    Stomach pain  . Meloxicam Other (See Comments)    Sores/ulcerations mouth/tongue/throat  . Tizanidine Other (See Comments)    Nightmares, insomnia   . Venlafaxine Other (See Comments)    Did not feel well at all, very lethargic      Past Medical History:  Diagnosis Date  . Arthritis     Review of systems:  Otherwise negative.    Physical Exam  Gen: Alert, oriented. Appears stated age.  HEENT: [place]/AT. PERRLA. Lungs: No respiratory distress Abd: soft, benign, no masses.  Ext: No edema.     Planned procedures: Proceed with EGD/colonoscopy. The patient understands the nature of the planned procedure, indications, risks, alternatives and potential complications including but not limited to bleeding, infection, perforation, damage to internal organs and possible oversedation/side effects from anesthesia. The patient agrees and gives consent to proceed.  Please refer to procedure notes for findings, recommendations and patient disposition/instructions.     Merlyn Lot MD, MPH Gastroenterology 04/20/2020  12:53 PM

## 2020-04-20 NOTE — Anesthesia Preprocedure Evaluation (Signed)
Anesthesia Evaluation  Patient identified by MRN, date of birth, ID band Patient awake    Reviewed: Allergy & Precautions, NPO status , Patient's Chart, lab work & pertinent test results  History of Anesthesia Complications Negative for: history of anesthetic complications  Airway Mallampati: II  TM Distance: >3 FB Neck ROM: Full    Dental  (+) Poor Dentition   Pulmonary neg pulmonary ROS, neg sleep apnea, neg COPD,    breath sounds clear to auscultation- rhonchi (-) wheezing      Cardiovascular Exercise Tolerance: Good (-) hypertension(-) CAD, (-) Past MI, (-) Cardiac Stents and (-) CABG  Rhythm:Regular Rate:Normal - Systolic murmurs and - Diastolic murmurs    Neuro/Psych neg Seizures negative neurological ROS  negative psych ROS   GI/Hepatic negative GI ROS, Neg liver ROS,   Endo/Other  negative endocrine ROSneg diabetes  Renal/GU negative Renal ROS     Musculoskeletal  (+) Arthritis ,   Abdominal (+) - obese,   Peds  Hematology negative hematology ROS (+)   Anesthesia Other Findings Past Medical History: No date: Arthritis   Reproductive/Obstetrics                             Anesthesia Physical Anesthesia Plan  ASA: II  Anesthesia Plan: General   Post-op Pain Management:    Induction: Intravenous  PONV Risk Score and Plan: 2 and Propofol infusion  Airway Management Planned: Natural Airway  Additional Equipment:   Intra-op Plan:   Post-operative Plan:   Informed Consent: I have reviewed the patients History and Physical, chart, labs and discussed the procedure including the risks, benefits and alternatives for the proposed anesthesia with the patient or authorized representative who has indicated his/her understanding and acceptance.     Dental advisory given  Plan Discussed with: CRNA and Anesthesiologist  Anesthesia Plan Comments:         Anesthesia Quick  Evaluation

## 2020-04-20 NOTE — Interval H&P Note (Signed)
History and Physical Interval Note:  04/20/2020 12:56 PM  Alyssa Vega  has presented today for surgery, with the diagnosis of gen abd pain nausea.  The various methods of treatment have been discussed with the patient and family. After consideration of risks, benefits and other options for treatment, the patient has consented to  Procedure(s): COLONOSCOPY WITH PROPOFOL (N/A) ESOPHAGOGASTRODUODENOSCOPY (EGD) WITH PROPOFOL (N/A) as a surgical intervention.  The patient's history has been reviewed, patient examined, no change in status, stable for surgery.  I have reviewed the patient's chart and labs.  Questions were answered to the patient's satisfaction.     Regis Bill  Ok to proceed with EGD/Colonoscopy

## 2020-04-21 ENCOUNTER — Encounter: Payer: Self-pay | Admitting: Gastroenterology

## 2020-04-21 LAB — SURGICAL PATHOLOGY

## 2020-04-25 DIAGNOSIS — M810 Age-related osteoporosis without current pathological fracture: Secondary | ICD-10-CM | POA: Insufficient documentation

## 2020-05-11 ENCOUNTER — Encounter (INDEPENDENT_AMBULATORY_CARE_PROVIDER_SITE_OTHER): Payer: Self-pay

## 2020-05-11 ENCOUNTER — Encounter (INDEPENDENT_AMBULATORY_CARE_PROVIDER_SITE_OTHER): Payer: Self-pay | Admitting: Nurse Practitioner

## 2020-07-01 ENCOUNTER — Encounter (INDEPENDENT_AMBULATORY_CARE_PROVIDER_SITE_OTHER): Payer: Self-pay | Admitting: Nurse Practitioner

## 2020-07-01 ENCOUNTER — Encounter (INDEPENDENT_AMBULATORY_CARE_PROVIDER_SITE_OTHER): Payer: Self-pay

## 2020-08-12 ENCOUNTER — Encounter (INDEPENDENT_AMBULATORY_CARE_PROVIDER_SITE_OTHER): Payer: Self-pay | Admitting: Nurse Practitioner

## 2020-08-12 ENCOUNTER — Encounter (INDEPENDENT_AMBULATORY_CARE_PROVIDER_SITE_OTHER): Payer: Self-pay

## 2021-01-07 ENCOUNTER — Other Ambulatory Visit (HOSPITAL_COMMUNITY): Payer: Self-pay | Admitting: Physician Assistant

## 2021-01-07 ENCOUNTER — Other Ambulatory Visit: Payer: Self-pay | Admitting: Physician Assistant

## 2021-01-07 DIAGNOSIS — R42 Dizziness and giddiness: Secondary | ICD-10-CM

## 2021-01-07 DIAGNOSIS — G8929 Other chronic pain: Secondary | ICD-10-CM

## 2021-01-21 ENCOUNTER — Other Ambulatory Visit: Payer: Self-pay

## 2021-01-21 ENCOUNTER — Ambulatory Visit
Admission: RE | Admit: 2021-01-21 | Discharge: 2021-01-21 | Disposition: A | Discharge status: Home / Self Care | Payer: Medicare Other | Source: Ambulatory Visit | Attending: Physician Assistant | Admitting: Physician Assistant

## 2021-01-21 DIAGNOSIS — R519 Headache, unspecified: Secondary | ICD-10-CM | POA: Insufficient documentation

## 2021-01-21 DIAGNOSIS — R42 Dizziness and giddiness: Secondary | ICD-10-CM | POA: Insufficient documentation

## 2021-01-21 DIAGNOSIS — G8929 Other chronic pain: Secondary | ICD-10-CM | POA: Insufficient documentation

## 2021-03-05 ENCOUNTER — Other Ambulatory Visit: Payer: Self-pay | Admitting: Neurosurgery

## 2021-03-05 DIAGNOSIS — M5414 Radiculopathy, thoracic region: Secondary | ICD-10-CM

## 2021-03-05 DIAGNOSIS — M542 Cervicalgia: Secondary | ICD-10-CM

## 2021-03-05 DIAGNOSIS — G8929 Other chronic pain: Secondary | ICD-10-CM

## 2021-03-05 DIAGNOSIS — M5412 Radiculopathy, cervical region: Secondary | ICD-10-CM

## 2021-03-18 ENCOUNTER — Other Ambulatory Visit: Payer: Self-pay

## 2021-03-18 ENCOUNTER — Ambulatory Visit
Admission: RE | Admit: 2021-03-18 | Discharge: 2021-03-18 | Disposition: A | Discharge status: Home / Self Care | Payer: Medicare Other | Source: Ambulatory Visit | Attending: Neurosurgery | Admitting: Neurosurgery

## 2021-03-18 DIAGNOSIS — M5412 Radiculopathy, cervical region: Secondary | ICD-10-CM

## 2021-03-18 DIAGNOSIS — M5414 Radiculopathy, thoracic region: Secondary | ICD-10-CM | POA: Insufficient documentation

## 2021-03-18 DIAGNOSIS — G8929 Other chronic pain: Secondary | ICD-10-CM | POA: Diagnosis present

## 2021-03-18 DIAGNOSIS — M542 Cervicalgia: Secondary | ICD-10-CM | POA: Insufficient documentation

## 2021-03-18 DIAGNOSIS — M5441 Lumbago with sciatica, right side: Secondary | ICD-10-CM | POA: Diagnosis present

## 2021-05-17 ENCOUNTER — Encounter: Payer: Self-pay | Admitting: *Deleted

## 2021-07-20 DIAGNOSIS — M79672 Pain in left foot: Secondary | ICD-10-CM | POA: Diagnosis not present

## 2021-07-20 DIAGNOSIS — M79671 Pain in right foot: Secondary | ICD-10-CM | POA: Diagnosis not present

## 2021-07-30 DIAGNOSIS — M170 Bilateral primary osteoarthritis of knee: Secondary | ICD-10-CM | POA: Diagnosis not present

## 2021-12-14 IMAGING — MR MR THORACIC SPINE W/O CM
6 series · 30 of 48 positions shown · non-contrast
Comparison: None.

CLINICAL DATA: Chronic back pain

EXAM:
MRI THORACIC SPINE WITHOUT CONTRAST
TECHNIQUE: Multiplanar, multisequence MR imaging of the thoracic spine was
performed. No intravenous contrast was administered.

[Series 16: T1 · sagittal · 5.0mm · 1.88mm/px · 2 of 9 slices shown (1 of 2)]
[im 1/9]
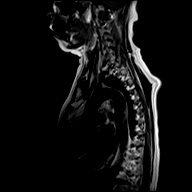
[im 9/9]
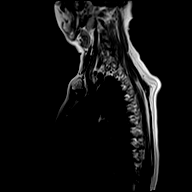

[Series 17: T2 · sagittal · 3.0mm · 1.06mm/px · 6 of 17 slices shown (1 of 2)]
[im 1/17]
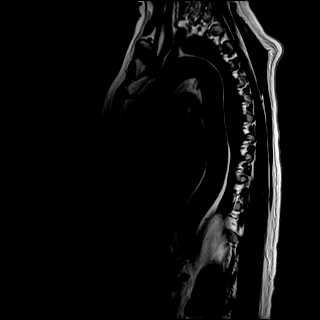
[im 4/17]
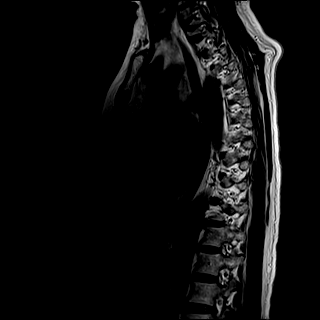
[im 7/17]
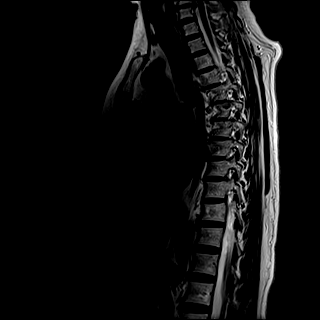
[im 10/17]
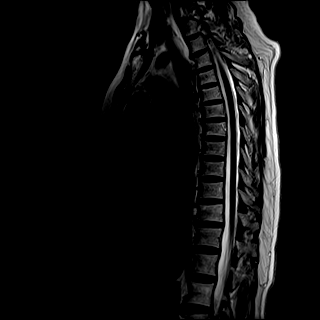
[im 13/17]
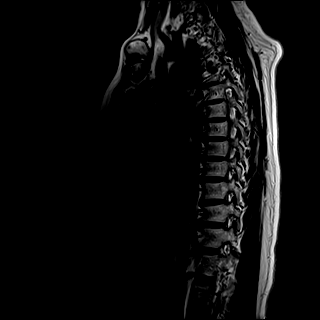
[im 17/17]
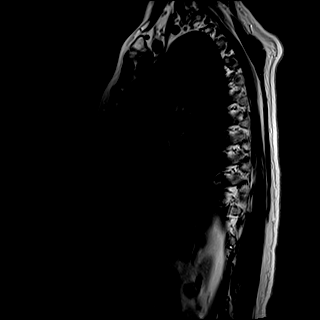

[Series 18: T1 · sagittal · 3.0mm · 1.06mm/px · 6 of 17 slices shown (2 of 2)]
[im 1/17]
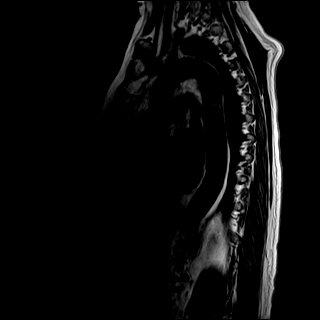
[im 4/17]
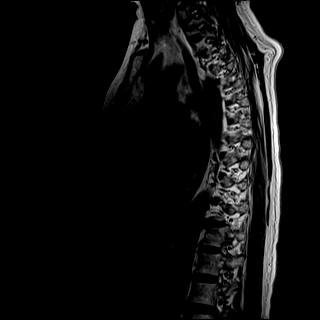
[im 7/17]
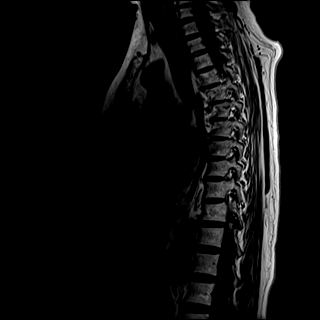
[im 10/17]
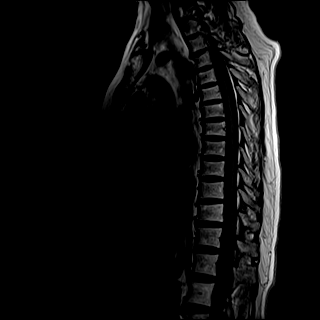
[im 13/17]
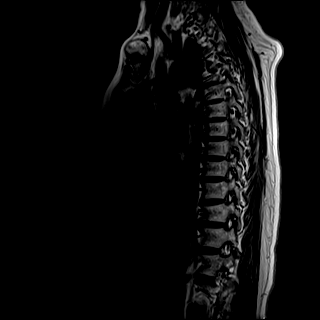
[im 17/17]
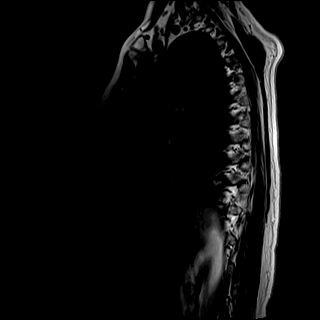

[Series 19: STIR · sagittal · 3.0mm · 0.53mm/px · 6 of 17 slices shown]
[im 1/17]
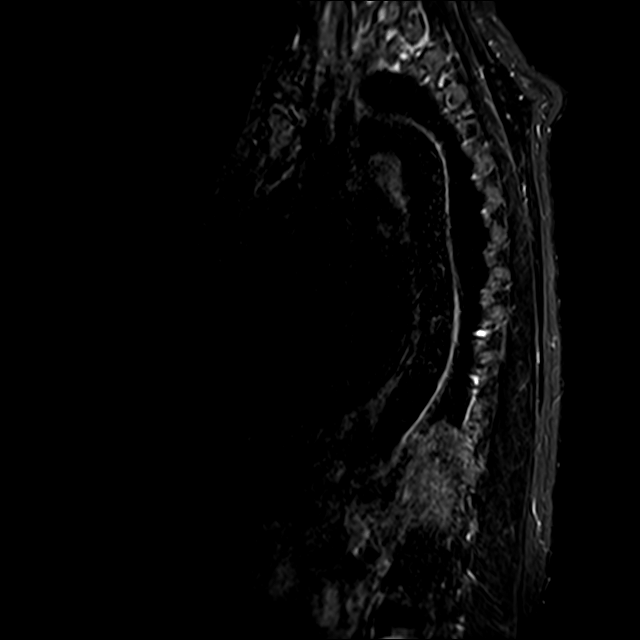
[im 4/17]
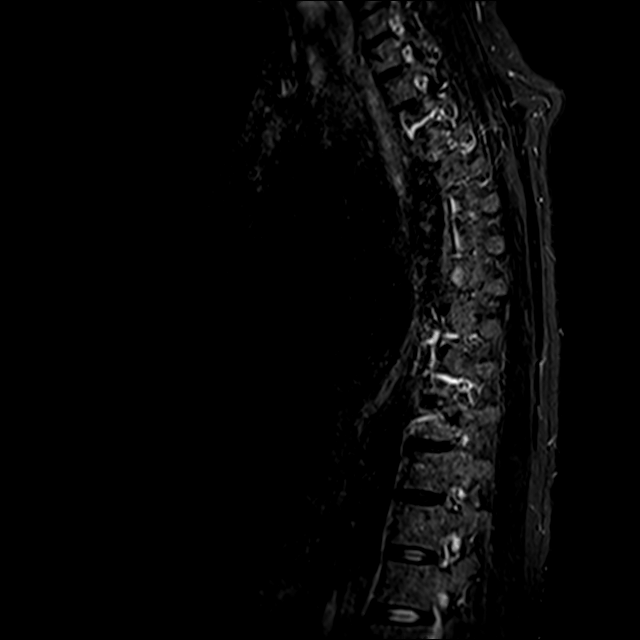
[im 7/17]
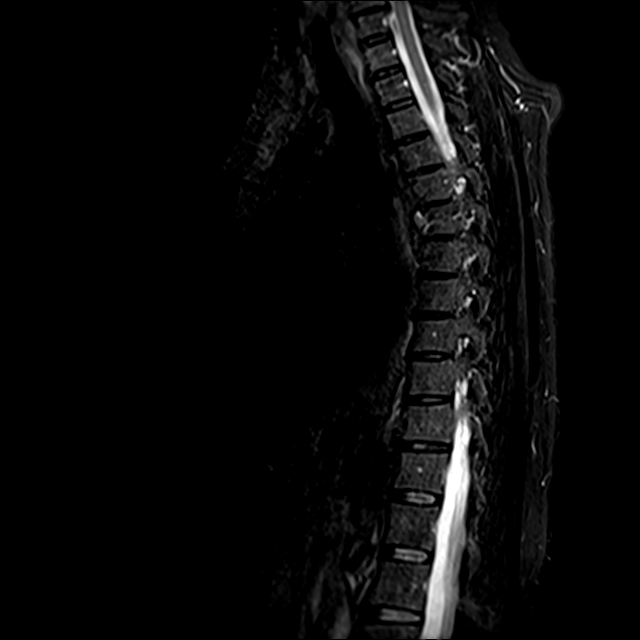
[im 10/17]
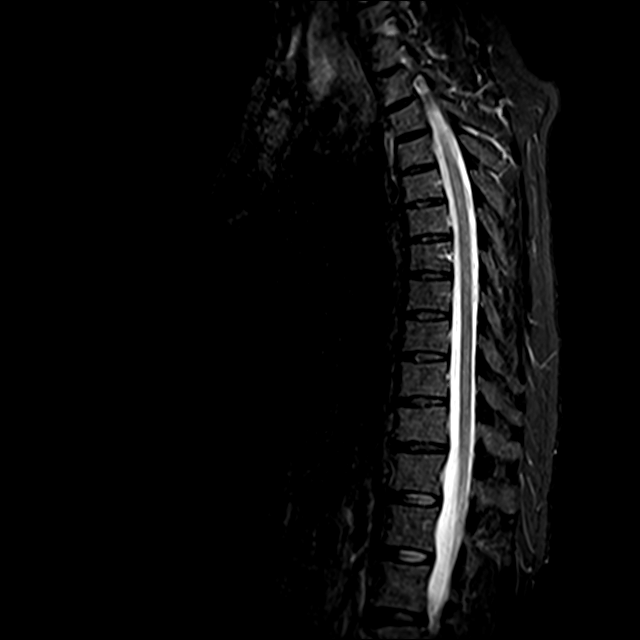
[im 13/17]
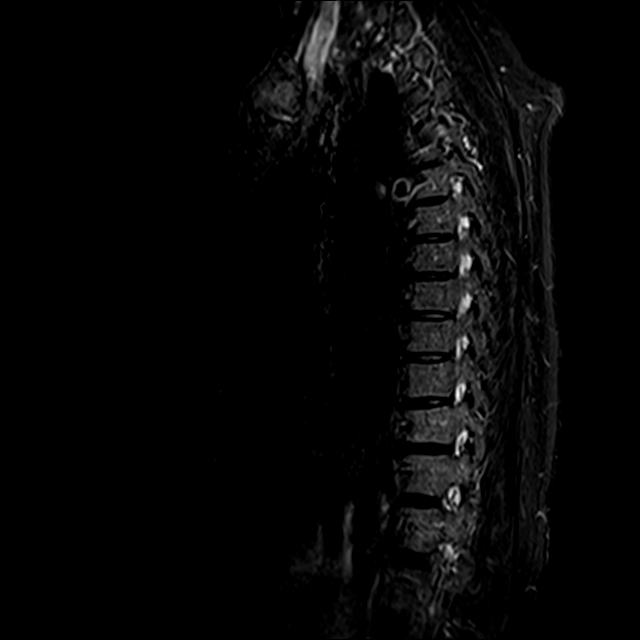
[im 17/17]
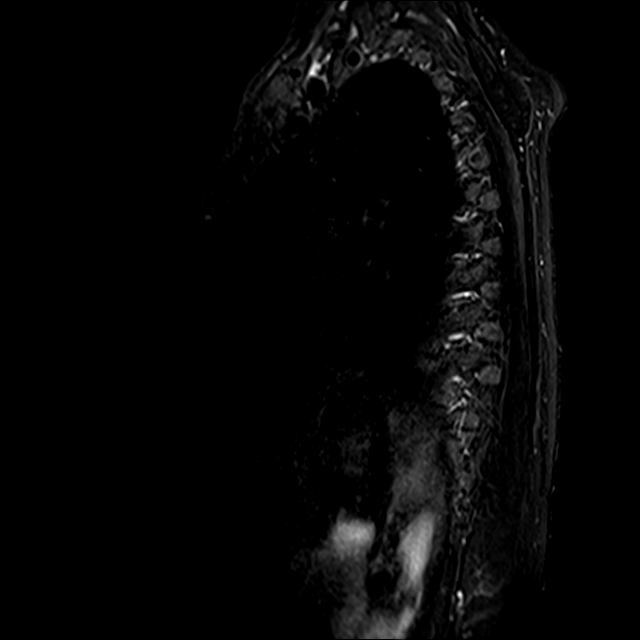

[Series 20: T2 · axial · 4.0mm · 0.59mm/px · z∈[-229,-5]mm · 8 of 39 slices shown (2 of 2)]
[im 1/39]
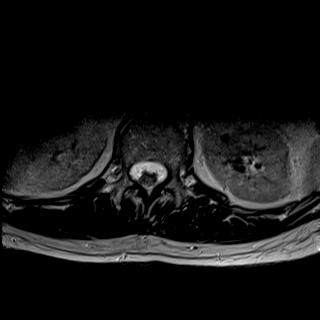
[im 6/39]
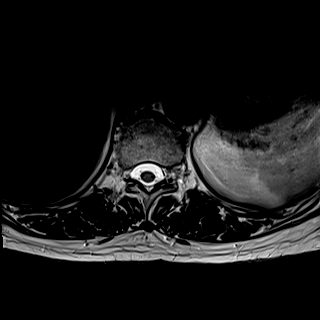
[im 12/39]
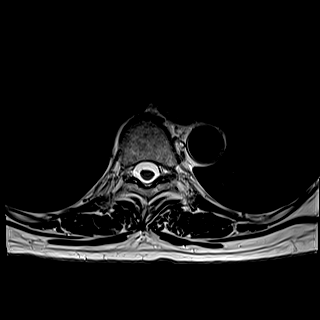
[im 18/39]
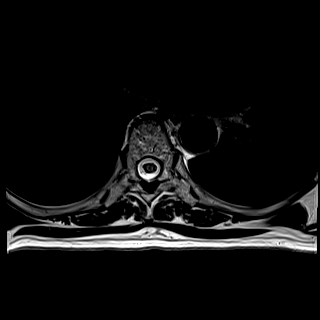
[im 21/39]
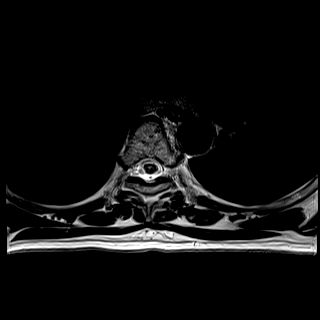
[im 27/39]
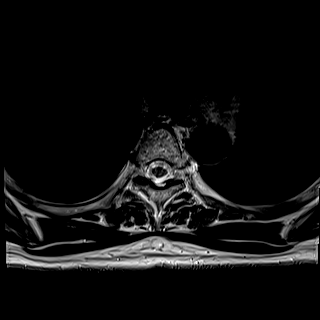
[im 33/39]
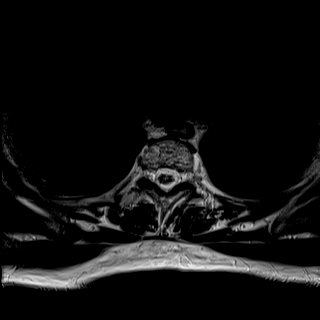
[im 39/39]
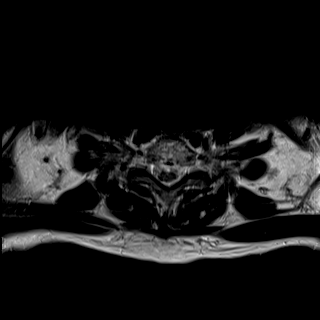

[Series 21: GRE · axial · 4.0mm · 0.37mm/px · z∈[-229,-193]mm · 2 of 39 slices shown]
[im 1/39]
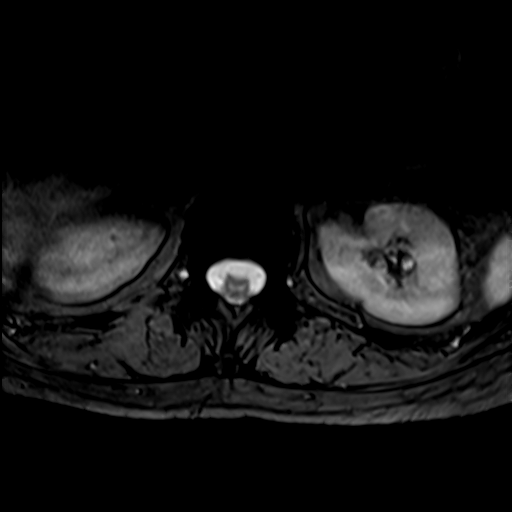
[im 6/39]
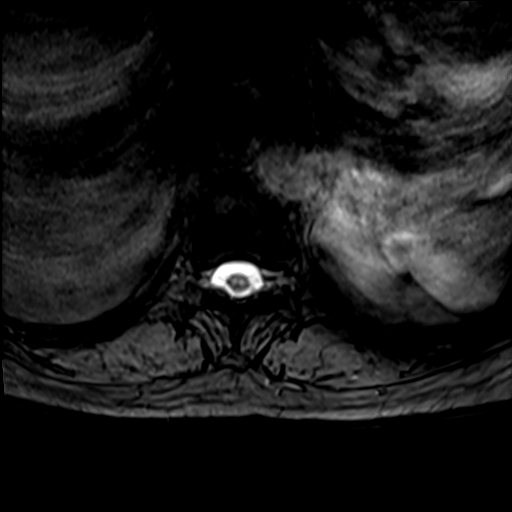

[30 of 48 positions shown; findings below may reference images not displayed]

FINDINGS: Alignment:  No static listhesis.  Slight thoracic dextrocurvature.

Vertebrae: No fracture, evidence of discitis, or bone lesion.

Cord:  Normal signal and morphology.

Paraspinal and other soft tissues: Negative.

Disc levels:

Intervertebral discs of the thoracic spine are preserved without
focal disc protrusion. No significant facet joint arthropathy. No
foraminal or canal stenosis at any level.
IMPRESSION: No significant degenerative changes of the thoracic spine. No
foraminal or canal stenosis at any level.

## 2022-01-26 DIAGNOSIS — E7800 Pure hypercholesterolemia, unspecified: Secondary | ICD-10-CM | POA: Diagnosis not present

## 2022-01-26 DIAGNOSIS — R899 Unspecified abnormal finding in specimens from other organs, systems and tissues: Secondary | ICD-10-CM | POA: Diagnosis not present

## 2022-01-26 DIAGNOSIS — R7303 Prediabetes: Secondary | ICD-10-CM | POA: Diagnosis not present

## 2022-02-02 DIAGNOSIS — M810 Age-related osteoporosis without current pathological fracture: Secondary | ICD-10-CM | POA: Diagnosis not present

## 2022-02-02 DIAGNOSIS — E7800 Pure hypercholesterolemia, unspecified: Secondary | ICD-10-CM | POA: Diagnosis not present

## 2022-02-02 DIAGNOSIS — K219 Gastro-esophageal reflux disease without esophagitis: Secondary | ICD-10-CM | POA: Diagnosis not present

## 2022-02-11 ENCOUNTER — Other Ambulatory Visit: Payer: Self-pay

## 2022-02-11 ENCOUNTER — Emergency Department: Payer: PPO

## 2022-02-11 ENCOUNTER — Emergency Department
Admission: EM | Admit: 2022-02-11 | Discharge: 2022-02-11 | Disposition: A | Discharge status: Home / Self Care | Payer: PPO | Attending: Emergency Medicine | Admitting: Emergency Medicine

## 2022-02-11 DIAGNOSIS — K5732 Diverticulitis of large intestine without perforation or abscess without bleeding: Secondary | ICD-10-CM | POA: Insufficient documentation

## 2022-02-11 DIAGNOSIS — R9431 Abnormal electrocardiogram [ECG] [EKG]: Secondary | ICD-10-CM | POA: Diagnosis not present

## 2022-02-11 DIAGNOSIS — K5792 Diverticulitis of intestine, part unspecified, without perforation or abscess without bleeding: Secondary | ICD-10-CM

## 2022-02-11 DIAGNOSIS — R1032 Left lower quadrant pain: Secondary | ICD-10-CM | POA: Diagnosis present

## 2022-02-11 DIAGNOSIS — K5730 Diverticulosis of large intestine without perforation or abscess without bleeding: Secondary | ICD-10-CM | POA: Diagnosis not present

## 2022-02-11 LAB — URINALYSIS, ROUTINE W REFLEX MICROSCOPIC
Bilirubin Urine: NEGATIVE
Glucose, UA: NEGATIVE mg/dL
Hgb urine dipstick: NEGATIVE
Ketones, ur: NEGATIVE mg/dL
Leukocytes,Ua: NEGATIVE
Nitrite: NEGATIVE
Protein, ur: NEGATIVE mg/dL
Specific Gravity, Urine: 1.002 — ABNORMAL LOW (ref 1.005–1.030)
pH: 6 (ref 5.0–8.0)

## 2022-02-11 LAB — COMPREHENSIVE METABOLIC PANEL
ALT: 21 U/L (ref 0–44)
AST: 24 U/L (ref 15–41)
Albumin: 4.6 g/dL (ref 3.5–5.0)
Alkaline Phosphatase: 59 U/L (ref 38–126)
Anion gap: 8 (ref 5–15)
BUN: 12 mg/dL (ref 8–23)
CO2: 25 mmol/L (ref 22–32)
Calcium: 9.5 mg/dL (ref 8.9–10.3)
Chloride: 105 mmol/L (ref 98–111)
Creatinine, Ser: 0.66 mg/dL (ref 0.44–1.00)
GFR, Estimated: >60 mL/min (ref >60)
Glucose, Bld: 117 mg/dL — ABNORMAL HIGH (ref 70–99)
Potassium: 3.7 mmol/L (ref 3.5–5.1)
Sodium: 138 mmol/L (ref 135–145)
Total Bilirubin: 0.7 mg/dL (ref 0.3–1.2)
Total Protein: 7.3 g/dL (ref 6.5–8.1)

## 2022-02-11 LAB — CBC
HCT: 41.7 % (ref 36.0–46.0)
Hemoglobin: 13.8 g/dL (ref 12.0–15.0)
MCH: 29.6 pg (ref 26.0–34.0)
MCHC: 33.1 g/dL (ref 30.0–36.0)
MCV: 89.5 fL (ref 80.0–100.0)
Platelets: 245 10*3/uL (ref 150–400)
RBC: 4.66 MIL/uL (ref 3.87–5.11)
RDW: 13.5 % (ref 11.5–15.5)
WBC: 5.6 10*3/uL (ref 4.0–10.5)
nRBC: 0 % (ref 0.0–0.2)

## 2022-02-11 LAB — LIPASE, BLOOD: Lipase: 38 U/L (ref 11–51)

## 2022-02-11 MED ORDER — IOHEXOL 300 MG/ML  SOLN
75.0000 mL | Freq: Once | INTRAMUSCULAR | Status: AC | PRN
  Administered 2022-02-11: 75 mL via INTRAVENOUS

## 2022-02-11 MED ORDER — AMOXICILLIN-POT CLAVULANATE 875-125 MG PO TABS
1.0000 | ORAL_TABLET | Freq: Once | ORAL | Status: AC
  Administered 2022-02-11: 1 via ORAL
  Filled 2022-02-11: qty 1

## 2022-02-11 MED ORDER — AMOXICILLIN-POT CLAVULANATE 875-125 MG PO TABS: 1.0000 | ORAL_TABLET | Freq: Two times a day (BID) | ORAL | 0 refills | Status: AC

## 2022-02-11 NOTE — ED Notes (Signed)
ED Provider at bedside. 

## 2022-02-11 NOTE — ED Triage Notes (Addendum)
Pt sent by walk-in clinic reportedly for history of H. Pylori and GI issues that the doctor saw and wanted her to get an ultrasound of the stomach for. Pt c/o lower abdominal pain for the last weak, pt reports mild nausea and frequent watery stools that is worse after eating. Pt is AOX4, NAD noted.

## 2022-02-11 NOTE — ED Provider Notes (Signed)
[facility] Provider Note    Event Date/Time   First MD Initiated Contact with Patient 02/11/22 2201     (approximate)   History   Abdominal Pain   HPI  Alyssa Vega is a 66 y.o. female past medical history of arthritis presents with diarrhea abdominal pain.  Patient states that symptoms started today.  Over the last several days has had several loose stools but diarrhea fully started today has had 2 very watery stools.  Pain is in the left lower quadrant denies fevers chills has had nausea but no vomiting pain feels worse after eating.  No chest pain or shortness of breath denies urinary symptoms.     Past Medical History:  Diagnosis Date   Arthritis     Patient Active Problem List   Diagnosis Date Noted   Lower abdominal pain    Abdominal pain, generalized    Hyponatremia 04/03/2020   Colitis 04/03/2020     Physical Exam  Triage Vital Signs: ED Triage Vitals  Enc Vitals Group     BP 02/11/22 1812 (!) 154/71     Pulse Rate 02/11/22 1812 70     Resp 02/11/22 1812 18     Temp 02/11/22 1812 98.1 F (36.7 C)     Temp Source 02/11/22 1812 Oral     SpO2 02/11/22 1812 97 %     Weight --      Height --      Head Circumference --      Peak Flow --      Pain Score 02/11/22 1818 8     Pain Loc --      Pain Edu? --      Excl. in GC? --     Most recent vital signs: Vitals:   02/11/22 2315 02/11/22 2357  BP: (!) 157/73 (!) 151/77  Pulse: 67 64  Resp: 18 18  Temp:    SpO2: 97% 97%     General: Awake, no distress.  CV:  Good peripheral perfusion.  Resp:  Normal effort.  Abd:  No distention.  Abdomen overall benign, soft mild tenderness in both right and left lower quadrant Neuro:             Awake, Alert, Oriented x 3  Other:     ED Results / Procedures / Treatments  Labs (all labs ordered are listed, but only abnormal results are displayed) Labs Reviewed  COMPREHENSIVE METABOLIC PANEL - Abnormal; Notable for the following  components:      Result Value   Glucose, Bld 117 (*)    All other components within normal limits  URINALYSIS, ROUTINE W REFLEX MICROSCOPIC - Abnormal; Notable for the following components:   Color, Urine COLORLESS (*)    APPearance CLEAR (*)    Specific Gravity, Urine 1.002 (*)    All other components within normal limits  LIPASE, BLOOD  CBC     EKG     RADIOLOGY I have reviewed and interpreted the CT abdomen pelvis which is negative for free air   PROCEDURES:  Critical Care performed: No  .1-3 Lead EKG Interpretation  Performed by: Georga Hacking, MD Authorized by: Georga Hacking, MD     Interpretation: normal     ECG rate assessment: normal     Rhythm: sinus rhythm     Ectopy: none     Conduction: normal     The patient is on the cardiac monitor to evaluate for evidence of  arrhythmia and/or significant heart rate changes.   MEDICATIONS ORDERED IN ED: Medications  iohexol (OMNIPAQUE) 300 MG/ML solution 75 mL (75 mLs Intravenous Contrast Given 02/11/22 2240)  amoxicillin-clavulanate (AUGMENTIN) 875-125 MG per tablet 1 tablet (1 tablet Oral Given 02/11/22 2337)     IMPRESSION / MDM / ASSESSMENT AND PLAN / ED COURSE  I reviewed the triage vital signs and the nursing notes.                              Patient's presentation is most consistent with acute presentation with potential threat to life or bodily function.  Differential diagnosis includes, but is not limited to, diverticulitis, colitis, gastroenteritis, pancreatitis  Patient is a 66 year old female presenting with diarrhea and abdominal pain.  She points to the left lower quadrant when asked about the location of her pain.  Stool is nonbloody she denies any new antibiotic use or travel out of the country.  No fevers pain does get worse after eating.  Overall her labs are reassuring she has no leukocytosis, CMP lipase UA without evidence of infection.  On exam she has mild tenderness in the  bilateral lower quadrants.  CT abdomen pelvis with contrast obtained indeterminate for possible mild diverticulitis.  Given symptomatology somewhat consistent with diverticulitis will treat with 7 days of Augmentin.  She is appropriate for discharge at this time.       FINAL CLINICAL IMPRESSION(S) / ED DIAGNOSES   Final diagnoses:  Diverticulitis     Rx / DC Orders   ED Discharge Orders          Ordered    amoxicillin-clavulanate (AUGMENTIN) 875-125 MG tablet  2 times daily        02/11/22 2329             Note:  This document was prepared using Dragon voice recognition software and may include unintentional dictation errors.   Georga Hacking, MD 02/12/22 [phone]

## 2022-03-07 DIAGNOSIS — M5126 Other intervertebral disc displacement, lumbar region: Secondary | ICD-10-CM | POA: Diagnosis not present

## 2022-03-07 DIAGNOSIS — M5136 Other intervertebral disc degeneration, lumbar region: Secondary | ICD-10-CM | POA: Diagnosis not present

## 2022-03-07 DIAGNOSIS — M5416 Radiculopathy, lumbar region: Secondary | ICD-10-CM | POA: Diagnosis not present

## 2022-03-10 DIAGNOSIS — R197 Diarrhea, unspecified: Secondary | ICD-10-CM | POA: Insufficient documentation

## 2022-03-10 DIAGNOSIS — R1032 Left lower quadrant pain: Secondary | ICD-10-CM | POA: Diagnosis not present

## 2022-03-10 DIAGNOSIS — K5732 Diverticulitis of large intestine without perforation or abscess without bleeding: Secondary | ICD-10-CM | POA: Diagnosis not present

## 2022-03-14 DIAGNOSIS — R197 Diarrhea, unspecified: Secondary | ICD-10-CM | POA: Diagnosis not present

## 2022-03-28 ENCOUNTER — Encounter (INDEPENDENT_AMBULATORY_CARE_PROVIDER_SITE_OTHER): Payer: Self-pay

## 2022-03-28 ENCOUNTER — Encounter (INDEPENDENT_AMBULATORY_CARE_PROVIDER_SITE_OTHER): Payer: Self-pay | Admitting: Vascular Surgery

## 2022-04-15 DIAGNOSIS — M5126 Other intervertebral disc displacement, lumbar region: Secondary | ICD-10-CM | POA: Diagnosis not present

## 2022-04-15 DIAGNOSIS — M5416 Radiculopathy, lumbar region: Secondary | ICD-10-CM | POA: Diagnosis not present

## 2022-05-06 DIAGNOSIS — M5136 Other intervertebral disc degeneration, lumbar region: Secondary | ICD-10-CM | POA: Diagnosis not present

## 2022-05-06 DIAGNOSIS — M5416 Radiculopathy, lumbar region: Secondary | ICD-10-CM | POA: Diagnosis not present

## 2022-05-06 DIAGNOSIS — M5412 Radiculopathy, cervical region: Secondary | ICD-10-CM | POA: Diagnosis not present

## 2022-05-06 DIAGNOSIS — M5030 Other cervical disc degeneration, unspecified cervical region: Secondary | ICD-10-CM | POA: Diagnosis not present

## 2022-05-06 DIAGNOSIS — M5126 Other intervertebral disc displacement, lumbar region: Secondary | ICD-10-CM | POA: Diagnosis not present

## 2022-05-16 ENCOUNTER — Encounter (INDEPENDENT_AMBULATORY_CARE_PROVIDER_SITE_OTHER): Payer: Self-pay | Admitting: Vascular Surgery

## 2022-05-16 ENCOUNTER — Encounter (INDEPENDENT_AMBULATORY_CARE_PROVIDER_SITE_OTHER): Payer: Self-pay

## 2022-06-20 ENCOUNTER — Encounter (INDEPENDENT_AMBULATORY_CARE_PROVIDER_SITE_OTHER): Payer: Self-pay

## 2022-06-20 ENCOUNTER — Encounter (INDEPENDENT_AMBULATORY_CARE_PROVIDER_SITE_OTHER): Payer: Self-pay | Admitting: Vascular Surgery

## 2022-07-28 DIAGNOSIS — Z79899 Other long term (current) drug therapy: Secondary | ICD-10-CM | POA: Insufficient documentation

## 2022-07-28 DIAGNOSIS — Z789 Other specified health status: Secondary | ICD-10-CM | POA: Insufficient documentation

## 2022-07-28 DIAGNOSIS — Z8742 Personal history of other diseases of the female genital tract: Secondary | ICD-10-CM | POA: Insufficient documentation

## 2022-07-28 DIAGNOSIS — M899 Disorder of bone, unspecified: Secondary | ICD-10-CM | POA: Insufficient documentation

## 2022-07-28 DIAGNOSIS — G894 Chronic pain syndrome: Secondary | ICD-10-CM | POA: Insufficient documentation

## 2022-07-28 DIAGNOSIS — R1032 Left lower quadrant pain: Secondary | ICD-10-CM | POA: Insufficient documentation

## 2022-07-28 NOTE — Patient Instructions (Signed)
____________________________________________________________________________________________  New Patients  Welcome to [facility].   Initial Visit The first or initial visit consists of an evaluation only.   Interventional pain management.  We offer therapies other than opioid controlled substances to manage chronic pain. These include, but are not limited to, diagnostic, therapeutic, and palliative specialized injection therapies (i.e.: Epidural Steroids, Facet Blocks, etc.). We specialize in a variety of nerve blocks as well as radiofrequency treatments. We offer pain implant evaluations and trials, as well as follow up management. In addition we also provide a variety joint injections, including Viscosupplementation (AKA: Gel Therapy).  Prescription Pain Medication We provide evaluations for/of pharmacologic therapies. Recommendations will follow CDC Guidelines.  We no longer take patients for long-term medication management. We will not be taking over your pain medications.  ____________________________________________________________________________________________    ____________________________________________________________________________________________  Patient Information update  To: All of our patients.  Re: Name change.  It has been made official that our current name, "[facility]"   will soon be changed to "[facility]".   The purpose of this change is to eliminate any confusion created by the concept of our practice being a "Medication Management Pain [facility]". In the past this has led to the misconception that we treat pain primarily by the use of prescription medications.  Nothing can be farther from the truth.   Understanding PAIN MANAGEMENT: To further understand what our practice does, you  first have to understand that "Pain Management" is a subspecialty that requires additional training once a physician has completed their specialty training, which can be in either Anesthesia, Neurology, Psychiatry, or Physical Medicine and Rehabilitation (PMR). Each one of these contributes to the final approach taken by each physician to the management of their patient's pain. To be a "Pain Management Specialist" you must have first completed one of the specialty trainings below.  Anesthesiologists - trained in clinical pharmacology and interventional techniques such as nerve blockade and regional as well as central neuroanatomy. They are trained to block pain before, during, and after surgical interventions.  Neurologists - trained in the diagnosis and pharmacological treatment of complex neurological conditions, such as Multiple Sclerosis, Parkinson's, spinal cord injuries, and other systemic conditions that may be associated with symptoms that may include but are not limited to pain. They tend to rely primarily on the treatment of chronic pain using prescription medications.  Psychiatrist - trained in conditions affecting the psychosocial wellbeing of patients including but not limited to depression, anxiety, schizophrenia, personality disorders, addiction, and other substance use disorders that may be associated with chronic pain. They tend to rely primarily on the treatment of chronic pain using prescription medications.   Physical Medicine and Rehabilitation (PMR) physicians, also known as physiatrists - trained to treat a wide variety of medical conditions affecting the brain, spinal cord, nerves, bones, joints, ligaments, muscles, and tendons. Their training is primarily aimed at treating patients that have suffered injuries that have caused severe physical impairment. Their training is primarily aimed at the physical therapy and rehabilitation of those patients. They may also work alongside  orthopedic surgeons or neurosurgeons using their expertise in assisting surgical patients to recover after their surgeries.  INTERVENTIONAL PAIN MANAGEMENT is sub-subspecialty of Pain Management.  Our physicians are Board-certified in Anesthesia, Pain Management, and Interventional Pain Management.  This meaning that not only have they been trained and Board-certified in their specialty of Anesthesia, and  subspecialty of Pain Management, but they have also received further training in the sub-subspecialty of Interventional Pain Management, in order to become Board-certified as INTERVENTIONAL PAIN MANAGEMENT SPECIALIST.    Mission: Our goal is to use our skills in  [place] as alternatives to the chronic use of prescription opioid medications for the treatment of pain. To make this more clear, we have changed our name to reflect what we do and offer. We will continue to offer medication management assessment and recommendations, but we will not be taking over any patient's medication management.  ____________________________________________________________________________________________

## 2022-07-28 NOTE — Progress Notes (Addendum)
Patient: Alyssa Vega  Service Category: E/M  Provider: Oswaldo Done, MD  DOB: 1956-04-01  DOS: 08/01/2022  Referring Provider: Denton Lank, FNP  MRN: [id number]  Setting: Ambulatory outpatient  PCP: Marisue Ivan, MD  Type: New Patient  Specialty: Interventional Pain Management    Location: Office  Delivery: Face-to-face     Primary Reason(s) for Visit: Encounter for initial evaluation of one or more chronic problems (new to examiner) potentially causing chronic pain, and posing a threat to normal musculoskeletal function. (Level of risk: High) CC: Back Pain (lower)  HPI  Alyssa Vega is a 67 y.o. year old, female patient, who comes for the first time to our practice referred by Meeler, Jodelle Gross, FNP for our initial evaluation of her chronic pain. She has Hyponatremia; Colitis; Abdominal pain, generalized; Lower abdominal pain; Age-related osteoporosis without current pathological fracture; Cervical radiculopathy; Chronic daily headache; Diarrhea of presumed infectious origin; Fatty liver disease, nonalcoholic; GAD (generalized anxiety disorder); Gastroesophageal reflux disease without esophagitis; History of cerebrovascular accident; Hx of ovarian cyst; Pure hypercholesterolemia; Sigmoid diverticulitis; LLQ pain; Chronic pain syndrome; Pharmacologic therapy; Disorder of skeletal system; Problems influencing health status; Chronic low back pain (1ry area of Pain) (Bilateral) (R>L) w/o sciatica; Chronic hip pain (2ry area of Pain) (Bilateral) (L>R); Chronic knee pain (3ry area of Pain) (Bilateral) (R>L); Chronic neck and back pain (4th area of Pain) (Bilateral) (L>R); Chronic upper back pain (5th area of Pain) (Bilateral); Chronic shoulder pain (6th area of Pain) (Bilateral) (L>R); Cervicogenic headache (7th area of Pain) (Bilateral) (R>L); Cervico-occipital neuralgia (Right); and Chronic feet pain (8th area of Pain) (Bilateral) (R>L) on their problem list. Today she comes in for evaluation  of her Back Pain (lower)  Pain Assessment: Location: Right, Left Back Radiating: pain radiaities down to both hip Onset: More than a month ago Duration: Chronic pain Quality: Aching, Burning, Constant, Sore, Nagging, Sharp Severity: 6 /10 (subjective, self-reported pain score)  Effect on ADL: limits my daily activities Timing: Constant Modifying factors: Meds and resting BP: 127/67  HR: 67  Onset and Duration: Gradual and Present longer than 3 months Cause of pain: Unknown Severity: Getting worse, NAS-11 at its worse: 9/10, NAS-11 at its best: 4/10, NAS-11 now: 6/10, and NAS-11 on the average: 5/10 Timing: Not influenced by the time of the day Aggravating Factors:  cold weather Alleviating Factors: Medications Associated Problems: Pain that wakes patient up Quality of Pain: Intermittent Previous Examinations or Tests: MRI scan and X-rays Previous Treatments: Epidural steroid injections  The patient comes into the clinic for the first time indicating that the primary area of pain is that of the lower back (Bilateral) (R>L).  She denies any prior surgeries, recent x-rays, but she indicates having had an MRI as well as having had some physical therapy.  She describes the physical therapy was done at the [facility] where she went twice per week for approximately 2 weeks.  She describes that it did provide her with some benefit.  The patient also indicates having had some injections done by Dr. Yves Dill.  According to the patient's referral, she was sent here for a spinal cord stimulator trial evaluation.  However, when I spoke to the patient she indicated knowing absolutely nothing about the spinal cord stimulator.  She denied having been informed about the device, or why she was being sent to [place].  The patient's secondary area pain is that of the hips (Bilateral) (L>R).  She denies any hip surgery, physical therapy, recent x-rays,  but she does indicate having had some hip joint injections  done by Dr. Yves Dill (3).  She indicates that it provided her with approximately 30% relief of the pain for approximately 1 to 2 months.  The patient's third area pain is that of the knees (Bilateral) (R>L).  She denies any prior knee surgery, physical therapy, but she indicates having had knee injections x 1 on each knee which did help.  She refers that this was done approximately 1 year ago.  The patient indicates having had x-rays of the knees approximately 2 years ago.  The knee injections were apparently done by Dr. Yves Dill.  The patient's fourth area pain is that of the neck and upper back (Bilateral) (L>R).  She again denies any prior surgeries, nerve blocks, physical therapy, recent x-rays, but she does indicate having headaches associated to this neck pain which are usually in the posterior aspect of the neck traveling over the ear, bilaterally, but with the right side being much worse than the left.  The distribution of the pain follows that of the lesser occipital nerve.  The patient's fifth area pain is that of the upper back (Bilateral) (L>R). Again she denies any prior surgeries, physical therapy, recent x-rays, or any nerve blocks.  The patient's sixth area of pain is that of the shoulders (Bilateral) (L>R).  She denies any prior surgeries, physical therapy, recent x-rays, but she does admit to having had some joint injections x 2 which apparently helped.  She refers having had this more than 2 years ago.  Again the procedures were apparently done by Dr. Yves Dill.  The patient's seventh area pain is that of the cervicogenic headaches (Bilateral) (L>R).  No prior surgeries, x-rays, nerve blocks, or physical therapy.  The patient's eighth area pain is that of her feet (Bilateral) (R>L).  She again denies any prior surgeries, physical therapy, nerve blocks or joint injections, but she indicates having had some x-rays of her feet long time ago.  If interested, Ms. Paulos's evaluation may  include pharmacologic recommendations, but I no longer take patients for medication management. She had been informed that the initial visit was an evaluation only.  On the follow up appointment I will go over the results, including ordered tests and available interventional therapies. At that time she will have the opportunity to decide whether to proceed with offered therapies or not. In the event that Ms. Bonsall prefers avoiding interventional options, this will conclude our involvement in the case.   Historic Controlled Substance Pharmacotherapy Review  PMP and historical list of controlled substances: None Most recently prescribed opioid analgesics:   None MME/day: 0 mg/day  Historical Monitoring: The patient  reports no history of drug use. List of prior UDS Testing: No results found for: "MDMA", "COCAINSCRNUR", "PCPSCRNUR", "PCPQUANT", "CANNABQUANT", "THCU", "ETH", "CBDTHCR", "D8THCCBX", "D9THCCBX" Historical Background Evaluation: [place] PMP: PDMP reviewed during this encounter. Review of the past 71-months conducted.             PMP NARX Score Report:  Narcotic: 000 Sedative: 000 Stimulant: 000 [place] Department of public safety, offender search: Engineer, mining Information) Non-contributory Risk Assessment Profile: Aberrant behavior: None observed or detected today Risk factors for fatal opioid overdose: None identified today PMP NARX Overdose Risk Score: 000 Fatal overdose hazard ratio (HR): Calculation deferred Non-fatal overdose hazard ratio (HR): Calculation deferred Risk of opioid abuse or dependence: 0.7-3.0% with doses ? 36 MME/day and 6.1-26% with doses ? 120 MME/day. Substance use disorder (SUD) risk level: See below Personal  History of Substance Abuse (SUD-Substance use disorder):  Alcohol: Negative  Illegal Drugs: Negative  Rx Drugs: Negative  ORT Risk Level calculation: Low Risk  Opioid Risk Tool - 08/01/22 1319       Family History of Substance Abuse   Alcohol Positive Female     Illegal Drugs Negative    Rx Drugs Negative      Personal History of Substance Abuse   Alcohol Negative    Illegal Drugs Negative    Rx Drugs Negative      Age   Age between 21-45 years  No      History of Preadolescent Sexual Abuse   History of Preadolescent Sexual Abuse Negative or Female      Psychological Disease   Psychological Disease Negative    Depression Negative      Total Score   Opioid Risk Tool Scoring 3    Opioid Risk Interpretation Low Risk            ORT Scoring interpretation table:  Score <3 = Low Risk for SUD  Score between 4-7 = Moderate Risk for SUD  Score >8 = High Risk for Opioid Abuse   PHQ-2 Depression Scale:  Total score:    PHQ-2 Scoring interpretation table: (Score and probability of major depressive disorder)  Score 0 = No depression  Score 1 = 15.4% Probability  Score 2 = 21.1% Probability  Score 3 = 38.4% Probability  Score 4 = 45.5% Probability  Score 5 = 56.4% Probability  Score 6 = 78.6% Probability   PHQ-9 Depression Scale:  Total score:    PHQ-9 Scoring interpretation table:  Score 0-4 = No depression  Score 5-9 = Mild depression  Score 10-14 = Moderate depression  Score 15-19 = Moderately severe depression  Score 20-27 = Severe depression (2.4 times higher risk of SUD and 2.89 times higher risk of overuse)   Pharmacologic Plan: As per protocol, I have not taken over any controlled substance management, pending the results of ordered tests and/or consults.            Initial impression: Pending review of available data and ordered tests.  Meds   Current Outpatient Medications:    alendronate (FOSAMAX) 70 MG tablet, Take 70 mg by mouth once a week. Take with a full glass of water on an empty stomach., Disp: , Rfl:    diclofenac (VOLTAREN) 75 MG EC tablet, Take 75 mg by mouth 2 (two) times daily., Disp: , Rfl:    ezetimibe (ZETIA) 10 MG tablet, Take 10 mg by mouth daily., Disp: , Rfl:    glucosamine-chondroitin 500-400 MG  tablet, Take 1 tablet by mouth daily., Disp: , Rfl:    Multiple Vitamin (MULTIVITAMIN) tablet, Take 1 tablet by mouth daily., Disp: , Rfl:    naproxen sodium (ALEVE) 220 MG tablet, Take 220 mg by mouth 2 (two) times daily as needed., Disp: , Rfl:    pantoprazole (PROTONIX) 20 MG tablet, Take 20 mg by mouth daily., Disp: , Rfl:   Imaging Review  Cervical Imaging: Cervical MR wo contrast: Results for orders placed during the hospital encounter of 03/18/21 MR CERVICAL SPINE WO CONTRAST  Narrative CLINICAL DATA:  Chronic neck pain radiating into the bilateral upper extremities.  EXAM: MRI CERVICAL SPINE WITHOUT CONTRAST  TECHNIQUE: Multiplanar, multisequence MR imaging of the cervical spine was performed. No intravenous contrast was administered.  COMPARISON:  03/30/2020  FINDINGS: Alignment: Straightening of the cervical lordosis with minimal retrolisthesis at C6-7.  Vertebrae:  No fracture, evidence of discitis, or bone lesion.  Cord: Normal signal and morphology.  Posterior Fossa, vertebral arteries, paraspinal tissues: Negative.  Disc levels:  C2-C3: No disc protrusion. Minimal bilateral facet arthropathy. No foraminal or canal stenosis. Unchanged.  C3-C4: No disc protrusion. Minimal right facet arthropathy. No foraminal or canal stenosis. Unchanged.  C4-C5: No disc protrusion. Mild bilateral facet arthropathy and right uncovertebral spurring. Mild right foraminal stenosis. No canal stenosis. Unchanged.  C5-C6: No disc protrusion. Mild bilateral facet and uncovertebral arthropathy. No foraminal or canal stenosis. Unchanged.  C6-C7: Disc osteophyte complex including central disc protrusion. Mild facet hypertrophy and bilateral uncovertebral spurring. There is moderate left and mild right foraminal stenosis with moderate canal stenosis. Unchanged.  C7-T1: No significant disc protrusion, foraminal stenosis, or canal stenosis. Unchanged.  IMPRESSION: Multilevel  cervical spondylosis most pronounced at the C6-7 level where there is moderate left and mild right foraminal stenosis as well as moderate canal stenosis. No significant interval progression from prior.   Electronically Signed By: Duanne Guess D.O. On: 03/20/2021 15:49  Thoracic Imaging: Thoracic MR wo contrast: Results for orders placed during the hospital encounter of 03/18/21 MR THORACIC SPINE WO CONTRAST  Narrative CLINICAL DATA:  Chronic back pain  EXAM: MRI THORACIC SPINE WITHOUT CONTRAST  TECHNIQUE: Multiplanar, multisequence MR imaging of the thoracic spine was performed. No intravenous contrast was administered.  COMPARISON:  None.  FINDINGS: Alignment:  No static listhesis.  Slight thoracic dextrocurvature.  Vertebrae: No fracture, evidence of discitis, or bone lesion.  Cord:  Normal signal and morphology.  Paraspinal and other soft tissues: Negative.  Disc levels:  Intervertebral discs of the thoracic spine are preserved without focal disc protrusion. No significant facet joint arthropathy. No foraminal or canal stenosis at any level.  IMPRESSION: No significant degenerative changes of the thoracic spine. No foraminal or canal stenosis at any level.   Electronically Signed By: Duanne Guess D.O. On: 03/20/2021 15:53  Lumbosacral Imaging: Lumbar MR wo contrast: Results for orders placed during the hospital encounter of 03/18/21 MR LUMBAR SPINE WO CONTRAST  Narrative CLINICAL DATA:  Chronic back pain, right worse than left  EXAM: MRI LUMBAR SPINE WITHOUT CONTRAST  TECHNIQUE: Multiplanar, multisequence MR imaging of the lumbar spine was performed. No intravenous contrast was administered.  COMPARISON:  03/30/2020  FINDINGS: Segmentation:  Standard.  Alignment:  Physiologic.  Vertebrae:  No fracture, evidence of discitis, or bone lesion.  Conus medullaris and cauda equina: Conus extends to the L2 level. Conus and cauda equina  appear normal.  Paraspinal and other soft tissues: Negative.  Disc levels:  T12-L1: Negative.  L1-L2: Negative.  L2-L3: Mild annular disc bulge with slight left foraminal disc protrusion. Mild bilateral facet hypertrophy. No foraminal or canal stenosis. Unchanged.  L3-L4: Mild annular disc bulge with slight left foraminal disc protrusion. Mild bilateral facet hypertrophy. No foraminal or canal stenosis. Unchanged.  L4-L5: Mild diffuse disc bulge with right extraforaminal disc osteophyte complex which displaces the exited right L4 nerve root. Small left foraminal disc protrusion. Mild bilateral facet arthropathy. Mild right foraminal stenosis. No canal stenosis. No significant interval progression.  L5-S1: Mild disc bulge, slightly eccentric to the left. Mild bilateral facet hypertrophy. No foraminal or canal stenosis. Unchanged.  IMPRESSION: 1. Mild multilevel degenerative changes of the lumbar spine without significant interval progression compared to the prior study. 2. Right extraforaminal disc osteophyte complex at L4-L5 displaces the exited right L4 nerve root. Mild right foraminal stenosis at this level. 3. No significant canal  stenosis at any level.   Electronically Signed By: Davina Poke D.O. On: 03/20/2021 15:59  Complexity Note: Imaging results reviewed.                         ROS  Cardiovascular: No reported cardiovascular signs or symptoms such as High blood pressure, coronary artery disease, abnormal heart rate or rhythm, heart attack, blood thinner therapy or heart weakness and/or failure Pulmonary or Respiratory: No reported pulmonary signs or symptoms such as wheezing and difficulty taking a deep full breath (Asthma), difficulty blowing air out (Emphysema), coughing up mucus (Bronchitis), persistent dry cough, or temporary stoppage of breathing during sleep Neurological: No reported neurological signs or symptoms such as seizures, abnormal skin  sensations, urinary and/or fecal incontinence, being born with an abnormal open spine and/or a tethered spinal cord Psychological-Psychiatric: No reported psychological or psychiatric signs or symptoms such as difficulty sleeping, anxiety, depression, delusions or hallucinations (schizophrenial), mood swings (bipolar disorders) or suicidal ideations or attempts Gastrointestinal: No reported gastrointestinal signs or symptoms such as vomiting or evacuating blood, reflux, heartburn, alternating episodes of diarrhea and constipation, inflamed or scarred liver, or pancreas or irrregular and/or infrequent bowel movements Genitourinary: No reported renal or genitourinary signs or symptoms such as difficulty voiding or producing urine, peeing blood, non-functioning kidney, kidney stones, difficulty emptying the bladder, difficulty controlling the flow of urine, or chronic kidney disease Hematological: No reported hematological signs or symptoms such as prolonged bleeding, low or poor functioning platelets, bruising or bleeding easily, hereditary bleeding problems, low energy levels due to low hemoglobin or being anemic Endocrine: No reported endocrine signs or symptoms such as high or low blood sugar, rapid heart rate due to high thyroid levels, obesity or weight gain due to slow thyroid or thyroid disease Rheumatologic: Joint aches and or swelling due to excess weight (Osteoarthritis) Musculoskeletal: Negative for myasthenia gravis, muscular dystrophy, multiple sclerosis or malignant hyperthermia Work History: Working part time  Allergies  Ms. Devita is allergic to duloxetine, atorvastatin, cyclobenzaprine, ibuprofen, melatonin, meloxicam, tizanidine, and venlafaxine.  Laboratory Chemistry Profile   Renal Lab Results  Component Value Date   BUN 12 02/11/2022   CREATININE 0.66 02/11/2022   GFRAA >60 04/05/2020   GFRNONAA >60 02/11/2022   PROTEINUR NEGATIVE 02/11/2022     Electrolytes Lab Results   Component Value Date   NA 138 02/11/2022   K 3.7 02/11/2022   CL 105 02/11/2022   CALCIUM 9.5 02/11/2022   MG 2.3 08/01/2022     Hepatic Lab Results  Component Value Date   AST 24 02/11/2022   ALT 21 02/11/2022   ALBUMIN 4.6 02/11/2022   ALKPHOS 59 02/11/2022   LIPASE 38 02/11/2022     ID Lab Results  Component Value Date   HIV Non Reactive 04/04/2020   SARSCOV2NAA NEGATIVE 04/16/2020     Bone Lab Results  Component Value Date   25OHVITD1 WILL FOLLOW 08/01/2022   25OHVITD2 WILL FOLLOW 08/01/2022   25OHVITD3 WILL FOLLOW 08/01/2022     Endocrine Lab Results  Component Value Date   GLUCOSE 117 (H) 02/11/2022   GLUCOSEU NEGATIVE 02/11/2022   TSH 4.189 04/04/2020     Neuropathy Lab Results  Component Value Date   VITAMINB12 >2000 (H) 08/01/2022   HIV Non Reactive 04/04/2020     CNS No results found for: "COLORCSF", "APPEARCSF", "RBCCOUNTCSF", "WBCCSF", "POLYSCSF", "LYMPHSCSF", "EOSCSF", "PROTEINCSF", "GLUCCSF", "JCVIRUS", "CSFOLI", "IGGCSF", "LABACHR", "ACETBL"   Inflammation (CRP: Acute  ESR: Chronic) Lab Results  Component Value Date   CRP <1 08/01/2022   ESRSEDRATE 7 08/01/2022     Rheumatology No results found for: "RF", "ANA", "LABURIC", "URICUR", "LYMEIGGIGMAB", "LYMEABIGMQN", "HLAB27"   Coagulation Lab Results  Component Value Date   PLT 245 02/11/2022     Cardiovascular Lab Results  Component Value Date   BNP 49.0 07/11/2018   TROPONINI <0.03 07/11/2018   HGB 13.8 02/11/2022   HCT 41.7 02/11/2022     Screening Lab Results  Component Value Date   SARSCOV2NAA NEGATIVE 04/16/2020   HIV Non Reactive 04/04/2020     Cancer No results found for: "CEA", "CA125", "LABCA2"   Allergens No results found for: "ALMOND", "APPLE", "ASPARAGUS", "AVOCADO", "BANANA", "BARLEY", "BASIL", "BAYLEAF", "GREENBEAN", "LIMABEAN", "WHITEBEAN", "BEEFIGE", "REDBEET", "BLUEBERRY", "BROCCOLI", "CABBAGE", "MELON", "CARROT", "CASEIN", "CASHEWNUT", "CAULIFLOWER",  "CELERY"     Note: Lab results reviewed.  PFSH  Drug: Ms. Boye  reports no history of drug use. Alcohol:  reports no history of alcohol use. Tobacco:  reports that she has never smoked. She has never used smokeless tobacco. Medical:  has a past medical history of Arthritis. Family: family history is not on file.  Past Surgical History:  Procedure Laterality Date   COLONOSCOPY WITH PROPOFOL N/A 04/20/2020   Procedure: COLONOSCOPY WITH PROPOFOL;  Surgeon: Regis Bill, MD;  Location: [facility] ENDOSCOPY;  Service: Endoscopy;  Laterality: N/A;   ESOPHAGOGASTRODUODENOSCOPY (EGD) WITH PROPOFOL N/A 04/20/2020   Procedure: ESOPHAGOGASTRODUODENOSCOPY (EGD) WITH PROPOFOL;  Surgeon: Regis Bill, MD;  Location: [facility] ENDOSCOPY;  Service: Endoscopy;  Laterality: N/A;   OVARIAN CYST REMOVAL     Active Ambulatory Problems    Diagnosis Date Noted   Hyponatremia 04/03/2020   Colitis 04/03/2020   Abdominal pain, generalized    Lower abdominal pain    Age-related osteoporosis without current pathological fracture 04/25/2020   Cervical radiculopathy 09/23/2019   Chronic daily headache 01/17/2017   Diarrhea of presumed infectious origin 03/10/2022   Fatty liver disease, nonalcoholic 09/23/2019   GAD (generalized anxiety disorder) 05/03/2019   Gastroesophageal reflux disease without esophagitis 02/02/2022   History of cerebrovascular accident 03/21/2019   Hx of ovarian cyst 07/28/2022   Pure hypercholesterolemia 12/16/2015   Sigmoid diverticulitis 03/10/2022   LLQ pain 07/28/2022   Chronic pain syndrome 07/28/2022   Pharmacologic therapy 07/28/2022   Disorder of skeletal system 07/28/2022   Problems influencing health status 07/28/2022   Chronic low back pain (1ry area of Pain) (Bilateral) (R>L) w/o sciatica 08/01/2022   Chronic hip pain (2ry area of Pain) (Bilateral) (L>R) 08/01/2022   Chronic knee pain (3ry area of Pain) (Bilateral) (R>L) 08/01/2022   Chronic neck and back pain  (4th area of Pain) (Bilateral) (L>R) 08/01/2022   Chronic upper back pain (5th area of Pain) (Bilateral) 08/01/2022   Chronic shoulder pain (6th area of Pain) (Bilateral) (L>R) 08/01/2022   Cervicogenic headache (7th area of Pain) (Bilateral) (R>L) 08/01/2022   Cervico-occipital neuralgia (Right) 08/01/2022   Chronic feet pain (8th area of Pain) (Bilateral) (R>L) 08/01/2022   Resolved Ambulatory Problems    Diagnosis Date Noted   No Resolved Ambulatory Problems   Past Medical History:  Diagnosis Date   Arthritis    Constitutional Exam  General appearance: Well nourished, well developed, and well hydrated. In no apparent acute distress Vitals:   08/01/22 1247  BP: 127/67  Pulse: 67  Temp: 97.9 F (36.6 C)  SpO2: 98%  Weight: 139 lb (63 kg)  Height: 5\' 6"  (1.676 m)   BMI Assessment: Estimated body  mass index is 22.44 kg/m as calculated from the following:   Height as of this encounter: 5\' 6"  (1.676 m).   Weight as of this encounter: 139 lb (63 kg).  BMI interpretation table: BMI level Category Range association with higher incidence of chronic pain  <18 kg/m2 Underweight   18.5-24.9 kg/m2 Ideal body weight   25-29.9 kg/m2 Overweight Increased incidence by 20%  30-34.9 kg/m2 Obese (Class I) Increased incidence by 68%  35-39.9 kg/m2 Severe obesity (Class II) Increased incidence by 136%  >40 kg/m2 Extreme obesity (Class III) Increased incidence by 254%   Patient's current BMI Ideal Body weight  Body mass index is 22.44 kg/m. Ideal body weight: 59.3 kg (130 lb 11.7 oz) Adjusted ideal body weight: 60.8 kg (134 lb 0.6 oz)   BMI Readings from Last 4 Encounters:  08/01/22 22.44 kg/m  04/20/20 28.02 kg/m  04/04/20 27.04 kg/m  07/11/18 26.52 kg/m   Wt Readings from Last 4 Encounters:  08/01/22 139 lb (63 kg)  04/20/20 139 lb (63 kg)  04/04/20 143 lb 1.6 oz (64.9 kg)  07/11/18 145 lb (65.8 kg)    Psych/Mental status: Alert, oriented x 3 (person, place, & time)        Eyes: PERLA Respiratory: No evidence of acute respiratory distress  Assessment  Primary Diagnosis & Pertinent Problem List: The primary encounter diagnosis was Chronic pain syndrome. Diagnoses of Chronic low back pain (1ry area of Pain) (Bilateral) (R>L) w/o sciatica, Chronic hip pain (2ry aref Pain) (Bilateral) (L>R), Chronic knee pain (3ry area of Pain) (Bilateral) (R>L), Chronic neck and back pain (4th area of Pain) (Bilateral) (L>R), Chronic upper back pain (5th area of Pain) (Bilateral), Chronic shoulder pain (6th area of Pain) (Bilateral) (L>R), Cervicogenic headache (7th area of Pain) (Bilateral) (R>L), Chronic feet pain (8th area of Pain) (Bilateral) (R>L), Cervico-occipital neuralgia (Right), Pharmacologic therapy, Disorder of skeletal system, and Problems influencing health status were also pertinent to this visit.  Visit Diagnosis (New problems to examiner): 1. Chronic pain syndrome   2. Chronic low back pain (1ry area of Pain) (Bilateral) (R>L) w/o sciatica   3. Chronic hip pain (2ry aref Pain) (Bilateral) (L>R)   4. Chronic knee pain (3ry area of Pain) (Bilateral) (R>L)   5. Chronic neck and back pain (4th area of Pain) (Bilateral) (L>R)   6. Chronic upper back pain (5th area of Pain) (Bilateral)   7. Chronic shoulder pain (6th area of Pain) (Bilateral) (L>R)   8. Cervicogenic headache (7th area of Pain) (Bilateral) (R>L)   9. Chronic feet pain (8th area of Pain) (Bilateral) (R>L)   10. Cervico-occipital neuralgia (Right)   11. Pharmacologic therapy   12. Disorder of skeletal system   13. Problems influencing health status    Plan of Care (Initial workup plan)  Note: Ms. Lair was reminded that as per protocol, today's visit has been an evaluation only. We have not taken over the patient's controlled substance management.  Problem-specific plan: No problem-specific Assessment & Plan notes found for this encounter.  Lab Orders         Compliance Drug Analysis, Ur          Magnesium         Vitamin B12         Sedimentation rate         25-Hydroxy vitamin D Lcms D2+D3         C-reactive protein     Imaging Orders  DG Cervical Spine With Flex & Extend         DG Shoulder Right         DG Shoulder Left         DG HIP UNILAT W OR W/O PELVIS 2-3 VIEWS RIGHT         DG HIP UNILAT W OR W/O PELVIS 2-3 VIEWS LEFT         DG Knee Complete 4 Views Right         DG Knee Complete 4 Views Left         DG Foot Complete Right         DG Foot Complete Left         DG Lumbar Spine Complete W/Bend     Referral Orders  No referral(s) requested today   Procedure Orders    No procedure(s) ordered today   Pharmacotherapy (current): Medications ordered:  No orders of the defined types were placed in this encounter.  Medications administered during this visit: Jamine A. Wymore had no medications administered during this visit.   Analgesic Pharmacotherapy:  Opioid Analgesics: For patients currently taking or requesting to take opioid analgesics, in accordance with [facility] Guidelines, we will assess their risks and indications for the use of these substances. After completing our evaluation, we may offer recommendations, but we no longer take patients for medication management. The prescribing physician will ultimately decide, based on his/her training and level of comfort whether to adopt any of the recommendations, including whether or not to prescribe such medicines.  Membrane stabilizer: To be determined at a later time  Muscle relaxant: To be determined at a later time  NSAID: To be determined at a later time  Other analgesic(s): To be determined at a later time   Interventional management options: Ms. Stoney Banggudo was informed that there is no guarantee that she would be a candidate for interventional therapies. The decision will be based on the results of diagnostic studies, as well as Ms. Picking's risk profile.  Procedure(s) under  consideration:  Pending results of ordered studies      Interventional Therapies  Risk Factors  Considerations:     Planned  Pending:   See above for possible orders   Under consideration:   Pending completion of evaluation   Completed:   None at this time   Completed by other providers:   By Dr. Yves Dillhasnis Therapeutic right L4-5 TFESI x2 (03/19/2021) by Merri RayBenjamin Chasnis, DO [facility])  Therapeutic right L5-S1 TFESI x1 (03/19/2021) by Merri RayBenjamin Chasnis, DO [facility])  Therapeutic right C6-7 TFESI x3 (05/19/2020) by Merri RayBenjamin Chasnis, DO [facility])  Therapeutic right C6-7 TFESI x1 (04/30/2021) by Merri RayBenjamin Chasnis, DO [facility])  Therapeutic bilateral IA steroid knee inj. x1 (07/30/2021) by Lasandra BeechBenjamin Smith, PA ([facility])    Therapeutic  Palliative (PRN) options:   None established    Provider-requested follow-up: Return in about 2 weeks (around 08/15/2022) for (40min), Eval-day (M,W), (F2F), (PPE), 2nd Visit, for review of ordered tests.  Future Appointments  Date Time Provider Department Center  09/05/2022  2:00 PM Delano MetzNaveira, Earnie Bechard, MD [facility] None  09/08/2022 10:45 AM [facility] 1 [facility] None  09/08/2022 11:45 AM Schnier, Latina CraverGregory G, MD [facility] None     Duration of encounter: 50 minutes.  Total time on encounter, as per AMA guidelines included both the face-to-face and non-face-to-face time personally spent by the physician and/or other qualified health care professional(s) on the day of the encounter (includes  time in activities that require the physician or other qualified health care professional and does not include time in activities normally performed by clinical staff). Physician's time may include the following activities when performed: Preparing to see the patient (e.g., pre-charting review of records, searching for previously ordered imaging, lab work, and nerve conduction tests) Review of prior analgesic pharmacotherapies. Reviewing PMP Interpreting ordered tests (e.g., lab  work, imaging, nerve conduction tests) Performing post-procedure evaluations, including interpretation of diagnostic procedures Obtaining and/or reviewing separately obtained history Performing a medically appropriate examination and/or evaluation Counseling and educating the patient/family/caregiver Ordering medications, tests, or procedures Referring and communicating with other health care professionals (when not separately reported) Documenting clinical information in the electronic or other health record Independently interpreting results (not separately reported) and communicating results to the patient/ family/caregiver Care coordination (not separately reported)  Note by: Oswaldo DoneFrancisco A Santana Edell, MD Date: 08/01/2022; Time: 12:14 PM

## 2022-08-01 ENCOUNTER — Encounter: Payer: Self-pay | Admitting: Pain Medicine

## 2022-08-01 ENCOUNTER — Ambulatory Visit (HOSPITAL_BASED_OUTPATIENT_CLINIC_OR_DEPARTMENT_OTHER): Payer: PPO | Admitting: Pain Medicine

## 2022-08-01 ENCOUNTER — Ambulatory Visit
Admission: RE | Admit: 2022-08-01 | Discharge: 2022-08-01 | Disposition: A | Discharge status: Home / Self Care | Payer: PPO | Source: Ambulatory Visit | Attending: Pain Medicine | Admitting: Pain Medicine

## 2022-08-01 VITALS — BP 127/67 | HR 67 | Temp 97.9°F | Ht 66.0 in | Wt 139.0 lb

## 2022-08-01 DIAGNOSIS — M25561 Pain in right knee: Secondary | ICD-10-CM

## 2022-08-01 DIAGNOSIS — M25711 Osteophyte, right shoulder: Secondary | ICD-10-CM | POA: Diagnosis not present

## 2022-08-01 DIAGNOSIS — M79672 Pain in left foot: Secondary | ICD-10-CM | POA: Insufficient documentation

## 2022-08-01 DIAGNOSIS — M79671 Pain in right foot: Secondary | ICD-10-CM | POA: Insufficient documentation

## 2022-08-01 DIAGNOSIS — M25552 Pain in left hip: Secondary | ICD-10-CM | POA: Insufficient documentation

## 2022-08-01 DIAGNOSIS — M542 Cervicalgia: Secondary | ICD-10-CM | POA: Insufficient documentation

## 2022-08-01 DIAGNOSIS — M25562 Pain in left knee: Secondary | ICD-10-CM

## 2022-08-01 DIAGNOSIS — M25551 Pain in right hip: Secondary | ICD-10-CM | POA: Insufficient documentation

## 2022-08-01 DIAGNOSIS — M25511 Pain in right shoulder: Secondary | ICD-10-CM

## 2022-08-01 DIAGNOSIS — M5481 Occipital neuralgia: Secondary | ICD-10-CM | POA: Insufficient documentation

## 2022-08-01 DIAGNOSIS — M85811 Other specified disorders of bone density and structure, right shoulder: Secondary | ICD-10-CM | POA: Diagnosis not present

## 2022-08-01 DIAGNOSIS — M1712 Unilateral primary osteoarthritis, left knee: Secondary | ICD-10-CM | POA: Diagnosis not present

## 2022-08-01 DIAGNOSIS — M25512 Pain in left shoulder: Secondary | ICD-10-CM | POA: Insufficient documentation

## 2022-08-01 DIAGNOSIS — G8929 Other chronic pain: Secondary | ICD-10-CM

## 2022-08-01 DIAGNOSIS — M19011 Primary osteoarthritis, right shoulder: Secondary | ICD-10-CM | POA: Diagnosis not present

## 2022-08-01 DIAGNOSIS — G894 Chronic pain syndrome: Secondary | ICD-10-CM

## 2022-08-01 DIAGNOSIS — Z79899 Other long term (current) drug therapy: Secondary | ICD-10-CM | POA: Insufficient documentation

## 2022-08-01 DIAGNOSIS — G4486 Cervicogenic headache: Secondary | ICD-10-CM | POA: Insufficient documentation

## 2022-08-01 DIAGNOSIS — M7731 Calcaneal spur, right foot: Secondary | ICD-10-CM | POA: Diagnosis not present

## 2022-08-01 DIAGNOSIS — M899 Disorder of bone, unspecified: Secondary | ICD-10-CM | POA: Insufficient documentation

## 2022-08-01 DIAGNOSIS — M545 Low back pain, unspecified: Secondary | ICD-10-CM

## 2022-08-01 DIAGNOSIS — M2578 Osteophyte, vertebrae: Secondary | ICD-10-CM | POA: Diagnosis not present

## 2022-08-01 DIAGNOSIS — M549 Dorsalgia, unspecified: Secondary | ICD-10-CM | POA: Insufficient documentation

## 2022-08-01 DIAGNOSIS — M47812 Spondylosis without myelopathy or radiculopathy, cervical region: Secondary | ICD-10-CM | POA: Diagnosis not present

## 2022-08-01 DIAGNOSIS — M160 Bilateral primary osteoarthritis of hip: Secondary | ICD-10-CM | POA: Diagnosis not present

## 2022-08-01 DIAGNOSIS — M1711 Unilateral primary osteoarthritis, right knee: Secondary | ICD-10-CM | POA: Diagnosis not present

## 2022-08-01 DIAGNOSIS — M19012 Primary osteoarthritis, left shoulder: Secondary | ICD-10-CM | POA: Diagnosis not present

## 2022-08-01 DIAGNOSIS — M7732 Calcaneal spur, left foot: Secondary | ICD-10-CM | POA: Diagnosis not present

## 2022-08-01 DIAGNOSIS — Z789 Other specified health status: Secondary | ICD-10-CM

## 2022-08-01 NOTE — Progress Notes (Signed)
Safety precautions to be maintained throughout the outpatient stay will include: orient to surroundings, keep bed in low position, maintain call bell within reach at all times, provide assistance with transfer out of bed and ambulation.  

## 2022-08-04 LAB — COMPLIANCE DRUG ANALYSIS, UR

## 2022-08-06 LAB — 25-HYDROXY VITAMIN D LCMS D2+D3
25-Hydroxy, Vitamin D-2: 4.7 ng/mL
25-Hydroxy, Vitamin D-3: 36 ng/mL
25-Hydroxy, Vitamin D: 41 ng/mL

## 2022-08-06 LAB — VITAMIN B12: Vitamin B-12: >2000 pg/mL — ABNORMAL HIGH (ref 232–1245)

## 2022-08-06 LAB — C-REACTIVE PROTEIN: CRP: <1 mg/L (ref 0–10)

## 2022-08-06 LAB — MAGNESIUM: Magnesium: 2.3 mg/dL (ref 1.6–2.3)

## 2022-08-06 LAB — SEDIMENTATION RATE: Sed Rate: 7 mm/hr (ref 0–40)

## 2022-08-18 DIAGNOSIS — E7800 Pure hypercholesterolemia, unspecified: Secondary | ICD-10-CM | POA: Diagnosis not present

## 2022-08-23 DIAGNOSIS — N952 Postmenopausal atrophic vaginitis: Secondary | ICD-10-CM | POA: Diagnosis not present

## 2022-08-23 DIAGNOSIS — N898 Other specified noninflammatory disorders of vagina: Secondary | ICD-10-CM | POA: Diagnosis not present

## 2022-08-25 DIAGNOSIS — Z0000 Encounter for general adult medical examination without abnormal findings: Secondary | ICD-10-CM | POA: Diagnosis not present

## 2022-08-25 DIAGNOSIS — E7800 Pure hypercholesterolemia, unspecified: Secondary | ICD-10-CM | POA: Diagnosis not present

## 2022-08-25 DIAGNOSIS — R1032 Left lower quadrant pain: Secondary | ICD-10-CM | POA: Diagnosis not present

## 2022-09-05 ENCOUNTER — Encounter: Payer: Self-pay | Admitting: Pain Medicine

## 2022-09-05 ENCOUNTER — Ambulatory Visit
Admission: RE | Admit: 2022-09-05 | Discharge: 2022-09-05 | Disposition: A | Discharge status: Home / Self Care | Payer: PPO | Source: Ambulatory Visit | Attending: Pain Medicine | Admitting: Pain Medicine

## 2022-09-05 ENCOUNTER — Ambulatory Visit (HOSPITAL_BASED_OUTPATIENT_CLINIC_OR_DEPARTMENT_OTHER): Payer: PPO | Admitting: Pain Medicine

## 2022-09-05 ENCOUNTER — Ambulatory Visit: Payer: PPO | Admitting: Pain Medicine

## 2022-09-05 ENCOUNTER — Other Ambulatory Visit
Admission: RE | Admit: 2022-09-05 | Discharge: 2022-09-05 | Disposition: A | Discharge status: Home / Self Care | Payer: PPO | Source: Ambulatory Visit | Attending: Pain Medicine | Admitting: Pain Medicine

## 2022-09-05 VITALS — BP 126/65 | HR 69 | Temp 97.2°F | Resp 16 | Ht 63.0 in | Wt 143.0 lb

## 2022-09-05 DIAGNOSIS — S5012XA Contusion of left forearm, initial encounter: Secondary | ICD-10-CM

## 2022-09-05 DIAGNOSIS — G8929 Other chronic pain: Secondary | ICD-10-CM

## 2022-09-05 DIAGNOSIS — M542 Cervicalgia: Secondary | ICD-10-CM | POA: Insufficient documentation

## 2022-09-05 DIAGNOSIS — M25512 Pain in left shoulder: Secondary | ICD-10-CM

## 2022-09-05 DIAGNOSIS — M25511 Pain in right shoulder: Secondary | ICD-10-CM | POA: Insufficient documentation

## 2022-09-05 DIAGNOSIS — M25561 Pain in right knee: Secondary | ICD-10-CM

## 2022-09-05 DIAGNOSIS — G4486 Cervicogenic headache: Secondary | ICD-10-CM

## 2022-09-05 DIAGNOSIS — S59902A Unspecified injury of left elbow, initial encounter: Secondary | ICD-10-CM | POA: Diagnosis not present

## 2022-09-05 DIAGNOSIS — S59912A Unspecified injury of left forearm, initial encounter: Secondary | ICD-10-CM | POA: Diagnosis not present

## 2022-09-05 DIAGNOSIS — M4310 Spondylolisthesis, site unspecified: Secondary | ICD-10-CM | POA: Insufficient documentation

## 2022-09-05 DIAGNOSIS — M549 Dorsalgia, unspecified: Secondary | ICD-10-CM | POA: Insufficient documentation

## 2022-09-05 DIAGNOSIS — L03114 Cellulitis of left upper limb: Secondary | ICD-10-CM

## 2022-09-05 DIAGNOSIS — M25551 Pain in right hip: Secondary | ICD-10-CM | POA: Insufficient documentation

## 2022-09-05 DIAGNOSIS — M5030 Other cervical disc degeneration, unspecified cervical region: Secondary | ICD-10-CM | POA: Insufficient documentation

## 2022-09-05 DIAGNOSIS — M47816 Spondylosis without myelopathy or radiculopathy, lumbar region: Secondary | ICD-10-CM | POA: Insufficient documentation

## 2022-09-05 DIAGNOSIS — M545 Low back pain, unspecified: Secondary | ICD-10-CM | POA: Insufficient documentation

## 2022-09-05 DIAGNOSIS — W19XXXA Unspecified fall, initial encounter: Secondary | ICD-10-CM

## 2022-09-05 DIAGNOSIS — M25552 Pain in left hip: Secondary | ICD-10-CM | POA: Insufficient documentation

## 2022-09-05 DIAGNOSIS — M25562 Pain in left knee: Secondary | ICD-10-CM | POA: Insufficient documentation

## 2022-09-05 DIAGNOSIS — M4802 Spinal stenosis, cervical region: Secondary | ICD-10-CM

## 2022-09-05 DIAGNOSIS — R937 Abnormal findings on diagnostic imaging of other parts of musculoskeletal system: Secondary | ICD-10-CM

## 2022-09-05 DIAGNOSIS — M47812 Spondylosis without myelopathy or radiculopathy, cervical region: Secondary | ICD-10-CM

## 2022-09-05 DIAGNOSIS — M4312 Spondylolisthesis, cervical region: Secondary | ICD-10-CM

## 2022-09-05 LAB — CBC WITH DIFFERENTIAL/PLATELET
Abs Immature Granulocytes: 0.01 10*3/uL (ref 0.00–0.07)
Basophils Absolute: 0.1 10*3/uL (ref 0.0–0.1)
Basophils Relative: 1 %
Eosinophils Absolute: 0.2 10*3/uL (ref 0.0–0.5)
Eosinophils Relative: 3 %
HCT: 39.1 % (ref 36.0–46.0)
Hemoglobin: 13.3 g/dL (ref 12.0–15.0)
Immature Granulocytes: 0 %
Lymphocytes Relative: 21 %
Lymphs Abs: 1.1 10*3/uL (ref 0.7–4.0)
MCH: 29.4 pg (ref 26.0–34.0)
MCHC: 34 g/dL (ref 30.0–36.0)
MCV: 86.3 fL (ref 80.0–100.0)
Monocytes Absolute: 0.6 10*3/uL (ref 0.1–1.0)
Monocytes Relative: 12 %
Neutro Abs: 3.3 10*3/uL (ref 1.7–7.7)
Neutrophils Relative %: 63 %
Platelets: 244 10*3/uL (ref 150–400)
RBC: 4.53 MIL/uL (ref 3.87–5.11)
RDW: 14.1 % (ref 11.5–15.5)
WBC: 5.3 10*3/uL (ref 4.0–10.5)
nRBC: 0 % (ref 0.0–0.2)

## 2022-09-05 NOTE — Progress Notes (Signed)
Safety precautions to be maintained throughout the outpatient stay will include: orient to surroundings, keep bed in low position, maintain call bell within reach at all times, provide assistance with transfer out of bed and ambulation.   Alyssa Vega from [facility] in to interrupt . Spanish Speaking.

## 2022-09-05 NOTE — Progress Notes (Signed)
PROVIDER NOTE: Information contained herein reflects review and annotations entered in association with encounter. Interpretation of such information and data should be left to medically-trained personnel. Information provided to patient can be located elsewhere in the medical record under "Patient Instructions". Document created using STT-dictation technology, any transcriptional errors that may result from process are unintentional.    Patient: Alyssa Vega  Service Category: E/M  Provider: Gaspar Cola, MD  DOB: [birth year]  DOS: 09/05/2022  Referring Provider: Dion Body, MD  MRN: [id number]  Specialty: Interventional Pain Management  PCP: Dion Body, MD  Type: Established Patient  Setting: Ambulatory outpatient    Location: Office  Delivery: Face-to-face     Primary Reason(s) for Visit: Encounter for evaluation before starting new chronic pain management plan of care (Level of risk: moderate) CC: Back Pain (Hips bilateral left worse), Arm Pain (Left forearm hand), and Knee Pain (Bilateral, right from the fall)  HPI  Alyssa Vega is a 67 y.o. year old, female patient, who comes today for a follow-up evaluation to review the test results and decide on a treatment plan. She has Hyponatremia; Colitis; Abdominal pain, generalized; Lower abdominal pain; Age-related osteoporosis without current pathological fracture; Cervical radiculopathy; Chronic daily headache; Diarrhea of presumed infectious origin; Fatty liver disease, nonalcoholic; GAD (generalized anxiety disorder); Gastroesophageal reflux disease without esophagitis; History of cerebrovascular accident; Hx of ovarian cyst; Pure hypercholesterolemia; Sigmoid diverticulitis; LLQ pain; Chronic pain syndrome; Pharmacologic therapy; Disorder of skeletal system; Problems influencing health status; Chronic low back pain (1ry area of Pain) (Bilateral) (R>L) w/o sciatica; Chronic hip pain (2ry area of Pain) (Bilateral) (L>R); Chronic knee  pain (3ry area of Pain) (Bilateral) (R>L); Chronic neck and back pain (4th area of Pain) (Bilateral) (L>R); Chronic upper back pain (5th area of Pain) (Bilateral); Chronic shoulder pain (6th area of Pain) (Bilateral) (L>R); Cervicogenic headache (7th area of Pain) (Bilateral) (R>L); Cervico-occipital neuralgia (Right); Chronic feet pain (8th area of Pain) (Bilateral) (R>L); Abnormal MRI, lumbar spine (03/20/2021); Abnormal MRI, cervical spine (03/20/2021); Cellulitis of forearm (Left); Traumatic hematoma of left forearm; Lumbar facet joint syndrome; Lumbar facet arthropathy (Multilevel) (Bilateral); Spondylosis without myelopathy or radiculopathy, cervical region; DDD (degenerative disc disease), cervical; Cervical foraminal stenosis (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-7); Cervical facet arthropathy (Multilevel) (Bilateral); Cervical facet syndrome; Grade 1 Retrolisthesis of C6/C7; and Cervical facet hypertrophy (C6-7) on their problem list. Her primarily concern today is the Back Pain (Hips bilateral left worse), Arm Pain (Left forearm hand), and Knee Pain (Bilateral, right from the fall)  Pain Assessment: Location:   Back Radiating: radiates to hips bilateral, left worse Onset: More than a month ago Duration: Chronic pain Quality: Aching, Burning, Constant, Tiring Severity: 9 /10 (subjective, self-reported pain score)  Effect on ADL: limits ADls Timing: Constant Modifying factors: meds, resting, sleep on floor BP: 126/65  HR: 69  Alyssa Vega comes in today for a follow-up visit after her initial evaluation on 08/01/2022. Today we went over the results of her tests. These were explained in "Layman's terms". During today's appointment we went over my diagnostic impression, as well as the proposed treatment plan.  For some reason, her appointment to our clinic was actually canceled by another practice, probably by mistake, but for this reason she did not appear in our schedule for today.  Today we had to see her as an  add-on.  Review of initial evaluation (08/01/2022): "The patient comes into the clinic for the first time indicating that the primary area of pain is that of the lower  back (Bilateral) (R>L).  She denies any prior surgeries, recent x-rays, but she indicates having had an MRI as well as having had some physical therapy.  She describes the physical therapy was done at the [facility] where she went twice per week for approximately 2 weeks.  She describes that it did provide her with some benefit.  The patient also indicates having had some injections done by Dr. Sharlet Salina.  According to the patient's referral, she was sent here for a spinal cord stimulator trial evaluation.  However, when I spoke to the patient she indicated knowing absolutely nothing about the spinal cord stimulator.  She denied having been informed about the device, or why she was being sent to [place].   The patient's secondary area pain is that of the hips (Bilateral) (L>R).  She denies any hip surgery, physical therapy, recent x-rays, but she does indicate having had some hip joint injections done by Dr. Sharlet Salina (3).  She indicates that it provided her with approximately 30% relief of the pain for approximately 1 to 2 months.   The patient's third area pain is that of the knees (Bilateral) (R>L).  She denies any prior knee surgery, physical therapy, but she indicates having had knee injections x 1 on each knee which did help.  She refers that this was done approximately 1 year ago.  The patient indicates having had x-rays of the knees approximately 2 years ago.  The knee injections were apparently done by Dr. Sharlet Salina.   The patient's fourth area pain is that of the neck and upper back (Bilateral) (L>R).  She again denies any prior surgeries, nerve blocks, physical therapy, recent x-rays, but she does indicate having headaches associated to this neck pain which are usually in the posterior aspect of the neck traveling over the ear,  bilaterally, but with the right side being much worse than the left.  The distribution of the pain follows that of the lesser occipital nerve.   The patient's fifth area pain is that of the upper back (Bilateral) (L>R). Again she denies any prior surgeries, physical therapy, recent x-rays, or any nerve blocks.   The patient's sixth area of pain is that of the shoulders (Bilateral) (L>R).  She denies any prior surgeries, physical therapy, recent x-rays, but she does admit to having had some joint injections x 2 which apparently helped.  She refers having had this more than 2 years ago.  Again the procedures were apparently done by Dr. Sharlet Salina.   The patient's seventh area pain is that of the cervicogenic headaches (Bilateral) (L>R).  No prior surgeries, x-rays, nerve blocks, or physical therapy.   The patient's eighth area pain is that of her feet (Bilateral) (R>L).  She again denies any prior surgeries, physical therapy, nerve blocks or joint injections, but she indicates having had some x-rays of her feet long time ago."  Review of ordered tests: Blood work was within normal limits except for vitamin B12 levels which were found to be above 2000 pg/mL.  X-rays of the cervical spine showed multilevel degenerative changes with findings most severe at the C6-7 level.  Uncovertebral joint hypertrophy with encroachment of the neural foramina bilaterally at the C6-7 level.  X-rays of the feet show calcaneal spurs bilaterally.  Diagnostic x-rays of the hip joints show bilateral hip degenerative changes compatible with osteoarthritis.  X-rays of the knees show mild degenerative changes medially for both knees.  Diagnostic x-rays of the shoulders show osteopenia with right acromioclavicular joint degenerative  changes.  For some reason the x-rays of the lumbar spine were not completed.  Today I went over the results of the above test with the patient and her daughter.  Interestingly enough, the topic today  revolved around a recent fall that she experienced where she hit her left elbow and forearm.  She currently has swelling on the forearm and the hand with clear evidence of having had a hematoma in the area.  She denies having been to the ED or urgent care to have it evaluated.  I have explained to the patient that this falls on the heading of "acute pain" and therefore needs to be fully evaluated for possible complications.  She denies having had any x-rays or anything else.  Today I am concerned about this injury because there is an area of redness over the posterior aspect of the forearm that seems to be compatible with a cellulitis.  She has increased temperature, redness, pain, and refers that has difficulty moving the left arm.  Today I will be ordering a CBC the with differential, an x-ray of the left forearm and elbow, and I have encouraged the patient to go to the ED/urgent care to have this evaluated for possible evidence of cellulitis that may require p.o. antibiotics.  The patient was not too happy with my recommendations since she recently had taken some Cipro and Robaxin, which were given to her by another physician for another type of infection.  She refers not liking to take antibiotics due to side effects.  Today's physical exam would suggest the patient's low back pain to be secondary to facet joint arthropathy/arthritis.  For this reason, I have entered an order for the patient to return for a diagnostic bilateral lumbar facet block.  However, I have also warned her that should the testing prove that she is currently having a cellulitis, we will not be doing any injections until this is under control.  She understood and accepted.  Patient presented with interventional treatment options. Alyssa Vega was informed that I will not be providing medication management. Pharmacotherapy evaluation including recommendations may be offered, if specifically requested.   Controlled Substance  Pharmacotherapy Assessment REMS (Risk Evaluation and Mitigation Strategy)  Opioid Analgesic: None MME/day: 0 mg/day  Pill Count: None expected due to no prior prescriptions written by our practice. Alyssa Specking, RN  09/05/2022  3:02 PM  Sign when Signing Visit Safety precautions to be maintained throughout the outpatient stay will include: orient to surroundings, keep bed in low position, maintain call bell within reach at all times, provide assistance with transfer out of bed and ambulation.   Alyssa Vega from [facility] in to interrupt . Spanish Speaking.   Pharmacokinetics: Liberation and absorption (onset of action): WNL Distribution (time to peak effect): WNL Metabolism and excretion (duration of action): WNL         Pharmacodynamics: Desired effects: Analgesia: Alyssa Vega reports >50% benefit. Functional ability: Patient reports that medication allows her to accomplish basic ADLs Clinically meaningful improvement in function (CMIF): Sustained CMIF goals met Perceived effectiveness: Described as relatively effective, allowing for increase in activities of daily living (ADL) Undesirable effects: Side-effects or Adverse reactions: None reported Monitoring: [address] PMP: PDMP not reviewed this encounter. Online review of the past 68-monthperiod previously conducted. Not applicable at this point since we have not taken over the patient's medication management yet. List of other Serum/Urine Drug Screening Test(s):  No results found for: "AMPHSCRSER", "BARBSCRSER", "BENZOSCRSER", "COCAINSCRSER", "COCAINSCRNUR", "PCPSCRSER", "THCSCRSER", "THCU", "  CANNABQUANT", "OPIATESCRSER", "OXYSCRSER", "PROPOXSCRSER", "ETH", "CBDTHCR", "D8THCCBX", "D9THCCBX" List of all UDS test(s) done:  Lab Results  Component Value Date   SUMMARY Note 08/01/2022   Last UDS on record: Summary  Date Value Ref Range Status  08/01/2022 Note  Final    Comment:     ==================================================================== Compliance Drug Analysis, Ur ==================================================================== Test                             Result       Flag       Units  Drug Absent but Declared for Prescription Verification   Diclofenac                     Not Detected UNEXPECTED    Diclofenac, as indicated in the declared medication list, is not    always detected even when used as directed.    Naproxen                       Not Detected UNEXPECTED ==================================================================== Test                      Result    Flag   Units      Ref Range   Creatinine              59               mg/dL      >=20 ==================================================================== Declared Medications:  The flagging and interpretation on this report are based on the  following declared medications.  Unexpected results may arise from  inaccuracies in the declared medications.   **Note: The testing scope of this panel includes these medications:   Naproxen (Aleve)   **Note: The testing scope of this panel does not include small to  moderate amounts of these reported medications:   Diclofenac (Voltaren)   **Note: The testing scope of this panel does not include the  following reported medications:   Alendronate (Fosamax)  Chondroitin  Ezetimibe (Zetia)  Glucosamine  Multivitamin  Pantoprazole (Protonix) ==================================================================== For clinical consultation, please call [phone]. ====================================================================    UDS interpretation: No unexpected findings.          Medication Assessment Form: Not applicable. No opioids. Treatment compliance: Not applicable Risk Assessment Profile: Aberrant behavior: See initial evaluations. None observed or detected today Comorbid factors increasing risk of overdose: See  initial evaluation. No additional risks detected today Opioid risk tool (ORT):     08/01/2022    1:19 PM  Opioid Risk   Alcohol 3  Illegal Drugs 0  Rx Drugs 0  Alcohol 0  Illegal Drugs 0  Rx Drugs 0  Age between 16-45 years  0  History of Preadolescent Sexual Abuse 0  Psychological Disease 0  Depression 0  Opioid Risk Tool Scoring 3  Opioid Risk Interpretation Low Risk    ORT Scoring interpretation table:  Score <3 = Low Risk for SUD  Score between 4-7 = Moderate Risk for SUD  Score >8 = High Risk for Opioid Abuse   Risk of substance use disorder (SUD): Low  Risk Mitigation Strategies:  Patient opioid safety counseling: No controlled substances prescribed. Patient-Prescriber Agreement (PPA): No agreement signed.  Controlled substance notification to other providers: None required. No opioid therapy.  Pharmacologic Plan: Non-opioid analgesic therapy offered. Interventional alternatives discussed.  Laboratory Chemistry Profile   Renal Lab Results  Component Value Date   BUN 12 02/11/2022   CREATININE 0.66 02/11/2022   GFRAA >60 04/05/2020   GFRNONAA >60 02/11/2022   PROTEINUR NEGATIVE 02/11/2022     Electrolytes Lab Results  Component Value Date   NA 138 02/11/2022   K 3.7 02/11/2022   CL 105 02/11/2022   CALCIUM 9.5 02/11/2022   MG 2.3 08/01/2022     Hepatic Lab Results  Component Value Date   AST 24 02/11/2022   ALT 21 02/11/2022   ALBUMIN 4.6 02/11/2022   ALKPHOS 59 02/11/2022   LIPASE 38 02/11/2022     ID Lab Results  Component Value Date   HIV Non Reactive 04/04/2020   SARSCOV2NAA NEGATIVE 04/16/2020     Bone Lab Results  Component Value Date   25OHVITD1 41 08/01/2022   25OHVITD2 4.7 08/01/2022   25OHVITD3 36 08/01/2022     Endocrine Lab Results  Component Value Date   GLUCOSE 117 (H) 02/11/2022   GLUCOSEU NEGATIVE 02/11/2022   TSH 4.189 04/04/2020     Neuropathy Lab Results  Component Value Date   VITAMINB12 >2000  (H) 08/01/2022   HIV Non Reactive 04/04/2020     CNS No results found for: "COLORCSF", "APPEARCSF", "RBCCOUNTCSF", "WBCCSF", "POLYSCSF", "LYMPHSCSF", "EOSCSF", "PROTEINCSF", "GLUCCSF", "JCVIRUS", "CSFOLI", "IGGCSF", "LABACHR", "ACETBL"   Inflammation (CRP: Acute  ESR: Chronic) Lab Results  Component Value Date   CRP <1 08/01/2022   ESRSEDRATE 7 08/01/2022     Rheumatology No results found for: "RF", "ANA", "LABURIC", "URICUR", "LYMEIGGIGMAB", "LYMEABIGMQN", "HLAB27"   Coagulation Lab Results  Component Value Date   PLT 245 02/11/2022     Cardiovascular Lab Results  Component Value Date   BNP 49.0 07/11/2018   TROPONINI <0.03 07/11/2018   HGB 13.8 02/11/2022   HCT 41.7 02/11/2022     Screening Lab Results  Component Value Date   SARSCOV2NAA NEGATIVE 04/16/2020   HIV Non Reactive 04/04/2020     Cancer No results found for: "CEA", "CA125", "LABCA2"   Allergens No results found for: "ALMOND", "APPLE", "ASPARAGUS", "AVOCADO", "BANANA", "BARLEY", "BASIL", "BAYLEAF", "GREENBEAN", "LIMABEAN", "WHITEBEAN", "BEEFIGE", "REDBEET", "BLUEBERRY", "BROCCOLI", "CABBAGE", "MELON", "CARROT", "CASEIN", "CASHEWNUT", "CAULIFLOWER", "CELERY"     Note: Lab results reviewed.  Recent Diagnostic Imaging Review  Cervical Imaging: Cervical MR wo contrast: Results for orders placed during the hospital encounter of 03/18/21 MR CERVICAL SPINE WO CONTRAST  Narrative CLINICAL DATA:  Chronic neck pain radiating into the bilateral upper extremities.  EXAM: MRI CERVICAL SPINE WITHOUT CONTRAST  TECHNIQUE: Multiplanar, multisequence MR imaging of the cervical spine was performed. No intravenous contrast was administered.  COMPARISON:  03/30/2020  FINDINGS: Alignment: Straightening of the cervical lordosis with minimal retrolisthesis at C6-7.  Vertebrae: No fracture, evidence of discitis, or bone lesion.  Cord: Normal signal and morphology.  Posterior Fossa, vertebral arteries,  paraspinal tissues: Negative.  Disc levels:  C2-C3: No disc protrusion. Minimal bilateral facet arthropathy. No foraminal or canal stenosis. Unchanged.  C3-C4: No disc protrusion. Minimal right facet arthropathy. No foraminal or canal stenosis. Unchanged.  C4-C5: No disc protrusion. Mild bilateral facet arthropathy and right uncovertebral spurring. Mild right foraminal stenosis. No canal stenosis. Unchanged.  C5-C6: No disc protrusion. Mild bilateral facet and uncovertebral arthropathy. No foraminal or canal stenosis. Unchanged.  C6-C7: Disc osteophyte complex including central disc protrusion. Mild facet hypertrophy and bilateral uncovertebral spurring. There is moderate left and mild right foraminal stenosis with moderate canal stenosis. Unchanged.  C7-T1: No significant disc  protrusion, foraminal stenosis, or canal stenosis. Unchanged.  IMPRESSION: Multilevel cervical spondylosis most pronounced at the C6-7 level where there is moderate left and mild right foraminal stenosis as well as moderate canal stenosis. No significant interval progression from prior.   Electronically Signed By: Davina Poke D.O. On: 03/20/2021 15:49  Cervical DG Bending/F/E views: Results for orders placed during the hospital encounter of 08/01/22 DG Cervical Spine With Flex & Extend  Narrative CLINICAL DATA:  Pain  EXAM: CERVICAL SPINE COMPLETE WITH FLEXION AND EXTENSION VIEWS  COMPARISON:  None Available.  FINDINGS: No compression deformities. Normal prevertebral and cervicocranial soft tissues. No spondylolisthesis. Disc space narrowing with marginal osteophyte formation noted C4-5 through C6-7. Uncovertebral joint hypertrophic changes with encroachment of neural foramina bilaterally at the C6-7 level.  No motion with flexion and extension.  IMPRESSION: Multilevel degenerative Changes. Findings most severe at C6-7. no acute osseous abnormalities  identified.   Electronically Signed By: Sammie Bench M.D. On: 08/01/2022 16:01  Shoulder Imaging: Shoulder-R DG: Results for orders placed during the hospital encounter of 08/01/22 DG Shoulder Right  Narrative CLINICAL DATA:  Pain  EXAM: RIGHT SHOULDER - 3 VIEW; LEFT SHOULDER - 3 VIEW  COMPARISON:  None Available.  FINDINGS: There is no evidence of fracture or dislocation. Osseous structures are osteopenic. Degenerative change identified at the right [facility] joint with osteophytes. No osteolytic or osteoblastic lesions identified.  IMPRESSION: Osteopenia. Right AC joint degenerative changes. No acute findings.   Electronically Signed By: Sammie Bench M.D. On: 08/01/2022 16:03  Shoulder-L DG: Results for orders placed during the hospital encounter of 08/01/22 DG Shoulder Left  Narrative CLINICAL DATA:  Pain  EXAM: RIGHT SHOULDER - 3 VIEW; LEFT SHOULDER - 3 VIEW  COMPARISON:  None Available.  FINDINGS: There is no evidence of fracture or dislocation. Osseous structures are osteopenic. Degenerative change identified at the right [facility] joint with osteophytes. No osteolytic or osteoblastic lesions identified.  IMPRESSION: Osteopenia. Right AC joint degenerative changes. No acute findings.   Electronically Signed By: Sammie Bench M.D. On: 08/01/2022 16:03   Thoracic Imaging: Thoracic MR wo contrast: Results for orders placed during the hospital encounter of 03/18/21 MR THORACIC SPINE WO CONTRAST  Narrative CLINICAL DATA:  Chronic back pain  EXAM: MRI THORACIC SPINE WITHOUT CONTRAST  TECHNIQUE: Multiplanar, multisequence MR imaging of the thoracic spine was performed. No intravenous contrast was administered.  COMPARISON:  None.  FINDINGS: Alignment:  No static listhesis.  Slight thoracic dextrocurvature.  Vertebrae: No fracture, evidence of discitis, or bone lesion.  Cord:  Normal signal and morphology.  Paraspinal and other soft tissues:  Negative.  Disc levels:  Intervertebral discs of the thoracic spine are preserved without focal disc protrusion. No significant facet joint arthropathy. No foraminal or canal stenosis at any level.  IMPRESSION: No significant degenerative changes of the thoracic spine. No foraminal or canal stenosis at any level.   Electronically Signed By: Davina Poke D.O. On: 03/20/2021 15:53  Lumbosacral Imaging: Lumbar MR wo contrast: Results for orders placed during the hospital encounter of 03/18/21 MR LUMBAR SPINE WO CONTRAST  Narrative CLINICAL DATA:  Chronic back pain, right worse than left  EXAM: MRI LUMBAR SPINE WITHOUT CONTRAST  TECHNIQUE: Multiplanar, multisequence MR imaging of the lumbar spine was performed. No intravenous contrast was administered.  COMPARISON:  03/30/2020  FINDINGS: Segmentation:  Standard.  Alignment:  Physiologic.  Vertebrae:  No fracture, evidence of discitis, or bone lesion.  Conus medullaris and cauda equina: Conus extends to the  L2 level. Conus and cauda equina appear normal.  Paraspinal and other soft tissues: Negative.  Disc levels:  T12-L1: Negative.  L1-L2: Negative.  L2-L3: Mild annular disc bulge with slight left foraminal disc protrusion. Mild bilateral facet hypertrophy. No foraminal or canal stenosis. Unchanged.  L3-L4: Mild annular disc bulge with slight left foraminal disc protrusion. Mild bilateral facet hypertrophy. No foraminal or canal stenosis. Unchanged.  L4-L5: Mild diffuse disc bulge with right extraforaminal disc osteophyte complex which displaces the exited right L4 nerve root. Small left foraminal disc protrusion. Mild bilateral facet arthropathy. Mild right foraminal stenosis. No canal stenosis. No significant interval progression.  L5-S1: Mild disc bulge, slightly eccentric to the left. Mild bilateral facet hypertrophy. No foraminal or canal stenosis. Unchanged.  IMPRESSION: 1. Mild multilevel  degenerative changes of the lumbar spine without significant interval progression compared to the prior study. 2. Right extraforaminal disc osteophyte complex at L4-L5 displaces the exited right L4 nerve root. Mild right foraminal stenosis at this level. 3. No significant canal stenosis at any level.   Electronically Signed By: Davina Poke D.O. On: 03/20/2021 15:59  Hip Imaging: Hip-R DG 2-3 views: Results for orders placed during the hospital encounter of 08/01/22 DG HIP UNILAT W OR W/O PELVIS 2-3 VIEWS RIGHT  Narrative CLINICAL DATA:  Pain  EXAM: DG HIP (WITH OR WITHOUT PELVIS) 3V RIGHT; DG HIP (WITH OR WITHOUT PELVIS) 3V LEFT  COMPARISON:  None Available.  FINDINGS: Bilateral hip degenerative changes identified with small osteophytes. No acute fracture, dislocation or subluxation. There is bilateral sacroiliac degenerative change with osteophytes. There are lumbosacral spondylitic change.  IMPRESSION: Degenerative changes.  No acute findings.   Electronically Signed By: Sammie Bench M.D. On: 08/01/2022 16:05  Hip-L DG 2-3 views: Results for orders placed during the hospital encounter of 08/01/22 DG HIP UNILAT W OR W/O PELVIS 2-3 VIEWS LEFT  Narrative CLINICAL DATA:  Pain  EXAM: DG HIP (WITH OR WITHOUT PELVIS) 3V RIGHT; DG HIP (WITH OR WITHOUT PELVIS) 3V LEFT  COMPARISON:  None Available.  FINDINGS: Bilateral hip degenerative changes identified with small osteophytes. No acute fracture, dislocation or subluxation. There is bilateral sacroiliac degenerative change with osteophytes. There are lumbosacral spondylitic change.  IMPRESSION: Degenerative changes.  No acute findings.   Electronically Signed By: Sammie Bench M.D. On: 08/01/2022 16:05  Knee Imaging: Knee-R DG 4 views: Results for orders placed during the hospital encounter of 08/01/22 DG Knee Complete 4 Views Right  Narrative CLINICAL DATA:  Pain  EXAM: RIGHT KNEE -  COMPLETE 4 VIEW; LEFT KNEE - COMPLETE 4 VIEW  COMPARISON:  None Available.  FINDINGS: No evidence of fracture, dislocation, or joint effusion. Early/mild bilateral medial compartment degenerative changes with small osteophytes. No osteolytic or osteoblastic changes identified.  IMPRESSION: Mild degenerative changes medially for both knees. No acute findings.   Electronically Signed By: Sammie Bench M.D. On: 08/01/2022 16:07  Knee-L DG 4 views: Results for orders placed during the hospital encounter of 08/01/22 DG Knee Complete 4 Views Left  Narrative CLINICAL DATA:  Pain  EXAM: RIGHT KNEE - COMPLETE 4 VIEW; LEFT KNEE - COMPLETE 4 VIEW  COMPARISON:  None Available.  FINDINGS: No evidence of fracture, dislocation, or joint effusion. Early/mild bilateral medial compartment degenerative changes with small osteophytes. No osteolytic or osteoblastic changes identified.  IMPRESSION: Mild degenerative changes medially for both knees. No acute findings.   Electronically Signed By: Sammie Bench M.D. On: 08/01/2022 16:07  Foot Imaging: Foot-R DG Complete: Results for  orders placed during the hospital encounter of 08/01/22 DG Foot Complete Right  Narrative CLINICAL DATA:  Pain  EXAM: RIGHT FOOT COMPLETE - 3 VIEW; LEFT FOOT - COMPLETE 3 VIEW  COMPARISON:  None Available.  FINDINGS: No acute fracture, dislocation or subluxation. No osteolytic or osteoblastic changes. Posterior calcaneal spur on the right. Plantar and posterior calcaneal spurs on the left.  IMPRESSION: Calcaneal spurs bilaterally.  No acute findings.   Electronically Signed By: Sammie Bench M.D. On: 08/01/2022 16:09  Foot-L DG Complete: Results for orders placed during the hospital encounter of 08/01/22 DG Foot Complete Left  Narrative CLINICAL DATA:  Pain  EXAM: RIGHT FOOT COMPLETE - 3 VIEW; LEFT FOOT - COMPLETE 3 VIEW  COMPARISON:  None Available.  FINDINGS: No acute  fracture, dislocation or subluxation. No osteolytic or osteoblastic changes. Posterior calcaneal spur on the right. Plantar and posterior calcaneal spurs on the left.  IMPRESSION: Calcaneal spurs bilaterally.  No acute findings.   Electronically Signed By: Sammie Bench M.D. On: 08/01/2022 16:09  Complexity Note: Imaging results reviewed.                         Meds   Current Outpatient Medications:    alendronate (FOSAMAX) 70 MG tablet, Take 70 mg by mouth once a week. Take with a full glass of water on an empty stomach., Disp: , Rfl:    diclofenac (VOLTAREN) 75 MG EC tablet, Take 75 mg by mouth 2 (two) times daily., Disp: , Rfl:    ezetimibe (ZETIA) 10 MG tablet, Take 10 mg by mouth daily., Disp: , Rfl:    glucosamine-chondroitin 500-400 MG tablet, Take 1 tablet by mouth daily., Disp: , Rfl:    Multiple Vitamin (MULTIVITAMIN) tablet, Take 1 tablet by mouth daily., Disp: , Rfl:    naproxen sodium (ALEVE) 220 MG tablet, Take 220 mg by mouth 2 (two) times daily as needed., Disp: , Rfl:    pantoprazole (PROTONIX) 20 MG tablet, Take 20 mg by mouth daily., Disp: , Rfl:    amoxicillin-clavulanate (AUGMENTIN) 875-125 MG tablet, SMARTSIG:1 Tablet(s) By Mouth Every 12 Hours (Patient not taking: Reported on 09/05/2022), Disp: , Rfl:   ROS  Constitutional: Denies any fever or chills Gastrointestinal: No reported hemesis, hematochezia, vomiting, or acute GI distress Musculoskeletal: Denies any acute onset joint swelling, redness, loss of ROM, or weakness Neurological: No reported episodes of acute onset apraxia, aphasia, dysarthria, agnosia, amnesia, paralysis, loss of coordination, or loss of consciousness  Allergies  Alyssa Vega is allergic to duloxetine, atorvastatin, cyclobenzaprine, ibuprofen, melatonin, meloxicam, tizanidine, and venlafaxine.  PFSH  Drug: Alyssa Vega  reports no history of drug use. Alcohol:  reports no history of alcohol use. Tobacco:  reports that she has never  smoked. She has never used smokeless tobacco. Medical:  has a past medical history of Arthritis. Surgical: Ms. Surprenant  has a past surgical history that includes Ovarian cyst removal; Colonoscopy with propofol (N/A, 04/20/2020); and Esophagogastroduodenoscopy (egd) with propofol (N/A, 04/20/2020). Family: family history is not on file.  Constitutional Exam  General appearance: Well nourished, well developed, and well hydrated. In no apparent acute distress Vitals:   09/05/22 1415  BP: 126/65  Pulse: 69  Resp: 16  Temp: (!) 97.2 F (36.2 C)  SpO2: 100%  Weight: 143 lb (64.9 kg)  Height: 5' 3"$  (1.6 m)   BMI Assessment: Estimated body mass index is 25.33 kg/m as calculated from the following:   Height as of  this encounter: 5' 3"$  (1.6 m).   Weight as of this encounter: 143 lb (64.9 kg).  BMI interpretation table: BMI level Category Range association with higher incidence of chronic pain  <18 kg/m2 Underweight   18.5-24.9 kg/m2 Ideal body weight   25-29.9 kg/m2 Overweight Increased incidence by 20%  30-34.9 kg/m2 Obese (Class I) Increased incidence by 68%  35-39.9 kg/m2 Severe obesity (Class II) Increased incidence by 136%  >40 kg/m2 Extreme obesity (Class III) Increased incidence by 254%   Patient's current BMI Ideal Body weight  Body mass index is 25.33 kg/m. Ideal body weight: 52.4 kg (115 lb 8.3 oz) Adjusted ideal body weight: 57.4 kg (126 lb 8.2 oz)   BMI Readings from Last 4 Encounters:  09/05/22 25.33 kg/m  08/01/22 22.44 kg/m  04/20/20 28.02 kg/m  04/04/20 27.04 kg/m   Wt Readings from Last 4 Encounters:  09/05/22 143 lb (64.9 kg)  08/01/22 139 lb (63 kg)  04/20/20 139 lb (63 kg)  04/04/20 143 lb 1.6 oz (64.9 kg)    Psych/Mental status: Alert, oriented x 3 (person, place, & time)       Eyes: PERLA Respiratory: No evidence of acute respiratory distress  Assessment & Plan  Primary Diagnosis & Pertinent Problem List: The primary encounter diagnosis was  Chronic low back pain (1ry area of Pain) (Bilateral) (R>L) w/o sciatica. Diagnoses of Traumatic hematoma of left forearm, initial encounter, Cellulitis of forearm (Left), Lumbar facet arthropathy (Multilevel) (Bilateral), Lumbar facet joint syndrome, Chronic hip pain (2ry area of Pain) (Bilateral) (L>R), Chronic knee pain (3ry area of Pain) (Bilateral) (R>L), Chronic neck and back pain (4th area of Pain) (Bilateral) (L>R), Chronic upper back pain (5th area of Pain) (Bilateral), Chronic shoulder pain (6th area of Pain) (Bilateral) (L>R), Cervicogenic headache (7th area of Pain) (Bilateral) (R>L), Abnormal MRI, lumbar spine (03/20/2021), Abnormal MRI, cervical spine (03/20/2021), Spondylosis without myelopathy or radiculopathy, cervical region, DDD (degenerative disc disease), cervical, Cervical foraminal stenosis (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-7), Cervical facet arthropathy (Multilevel) (Bilateral), Cervical facet syndrome, Grade 1 Retrolisthesis of C6/C7, and Cervical facet hypertrophy were also pertinent to this visit.  Visit Diagnosis: 1. Chronic low back pain (1ry area of Pain) (Bilateral) (R>L) w/o sciatica   2. Traumatic hematoma of left forearm, initial encounter   3. Cellulitis of forearm (Left)   4. Lumbar facet arthropathy (Multilevel) (Bilateral)   5. Lumbar facet joint syndrome   6. Chronic hip pain (2ry area of Pain) (Bilateral) (L>R)   7. Chronic knee pain (3ry area of Pain) (Bilateral) (R>L)   8. Chronic neck and back pain (4th area of Pain) (Bilateral) (L>R)   9. Chronic upper back pain (5th area of Pain) (Bilateral)   10. Chronic shoulder pain (6th area of Pain) (Bilateral) (L>R)   11. Cervicogenic headache (7th area of Pain) (Bilateral) (R>L)   12. Abnormal MRI, lumbar spine (03/20/2021)   13. Abnormal MRI, cervical spine (03/20/2021)   14. Spondylosis without myelopathy or radiculopathy, cervical region   15. DDD (degenerative disc disease), cervical   16. Cervical foraminal stenosis (Bilateral)  (L>R) (C6-7)   17. Cervical facet arthropathy (Multilevel) (Bilateral)   18. Cervical facet syndrome   19. Grade 1 Retrolisthesis of C6/C7   20. Cervical facet hypertrophy    Problems updated and reviewed during this visit: Problem  Abnormal MRI, lumbar spine (03/20/2021)   (03/20/2021) LUMBAR MRI FINDINGS: DISC LEVELS: L2-3: Mild annular disc bulge with slight left foraminal disc protrusion. Mild bilateral facet hypertrophy. L3-4: Mild annular disc bulge with  slight left foraminal disc protrusion. Mild bilateral facet hypertrophy. L4-5: Mild diffuse disc bulge with right extraforaminal disc osteophyte complex which displaces the exited right L4 nerve root. Small left foraminal disc protrusion. Mild bilateral facet arthropathy. Mild right foraminal stenosis. L5-S1: Mild disc bulge, slightly eccentric to the left. Mild bilateral facet hypertrophy.  IMPRESSION: 1. Mild multilevel degenerative changes of the lumbar spine 2. Right extraforaminal disc osteophyte complex at L4-5 displaces the exited right L4 nerve root. Mild right foraminal stenosis at this level.   Abnormal MRI, cervical spine (03/20/2021)   (03/20/2021) CERVICAL MRI FINDINGS: Alignment: Straightening of the cervical lordosis with minimal retrolisthesis at C6-7. DISC LEVELS: C2-3: bilateral facet arthropathy. C3-4: right facet arthropathy. C4-5: bilateral facet arthropathy and right uncovertebral spurring. Mild right foraminal stenosis. C5-6: bilateral facet and uncovertebral arthropathy. C6-7: Disc osteophyte complex including central disc protrusion. Mild facet hypertrophy and bilateral uncovertebral spurring. There is moderate left and mild right foraminal stenosis with moderate canal stenosis.  IMPRESSION: Multilevel cervical spondylosis most pronounced at the C6-7 level where there is moderate left and mild right foraminal stenosis as well as moderate canal stenosis.   Cellulitis of forearm (Left)  Traumatic Hematoma of  Left Forearm  Lumbar Facet Joint Syndrome  Lumbar facet arthropathy (Multilevel) (Bilateral)   (03/20/2021) LUMBAR MRI FINDINGS: LEVELS: L2-3: bilateral facet hypertrophy. L3-4: bilateral facet hypertrophy. L4-5: bilateral facet arthropathy. L5-S1: bilateral facet hypertrophy.   Spondylosis Without Myelopathy Or Radiculopathy, Cervical Region  Ddd (Degenerative Disc Disease), Cervical   Most pronounced at the C6-7 level   Cervical foraminal stenosis (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-7)  Cervical facet arthropathy (Multilevel) (Bilateral)  Cervical Facet Syndrome  Grade 1 Retrolisthesis of C6/C7  Cervical facet hypertrophy (C6-7)    Plan of Care  Pharmacotherapy (Medications Ordered): No orders of the defined types were placed in this encounter.  Procedure Orders         LUMBAR FACET(MEDIAL BRANCH NERVE BLOCK) MBNB     Lab Orders         CBC with Differential/Platelet     Imaging Orders         DG Forearm Left         DG Elbow 2 Views Left         DG Lumbar Spine Complete W/Bend     Referral Orders  No referral(s) requested today    Pharmacological management:  Opioid Analgesics: I will not be prescribing any opioids at this time Membrane stabilizer: I will not be prescribing any at this time Muscle relaxant: I will not be prescribing any at this time NSAID: I will not be prescribing any at this time Other analgesic(s): I will not be prescribing any at this time      Interventional Therapies  Risk Factors  Considerations:     Planned  Pending:      Under consideration:   Pending completion of evaluation   Completed:   None at this time   Completed by other providers:   By Dr. Sharlet Salina Therapeutic right L4-5 TFESI x2 (03/19/2021) by Sharlet Salina, DO [facility]R)  Therapeutic right L5-S1 TFESI x1 (03/19/2021) by Sharlet Salina, DO [facility])  Therapeutic right C6-7 TFESI x3 (05/19/2020) by Sharlet Salina, DO [facility] PMR)  Therapeutic right C6-7 TFESI x1 (04/30/2021) by  Sharlet Salina, DO [facility] PMR)  Therapeutic bilateral IA steroid knee inj. x1 (07/30/2021) by Tamala Julian, PA ([facility])    Therapeutic  Palliative (PRN) options:   None established       Provider-requested  follow-up: Return for (ECT): (B) L-FCT Blk #1. Recent Visits Date Type Provider Dept  08/01/22 Office Visit Milinda Pointer, MD [facility]  Showing recent visits within past 90 days and meeting all other requirements Today's Visits Date Type Provider Dept  09/05/22 Office Visit Milinda Pointer, MD [facility]  Showing today's visits and meeting all other requirements Future Appointments Date Type Provider Dept  09/15/22 Appointment Milinda Pointer, MD [facility]  Showing future appointments within next 90 days and meeting all other requirements   Primary Care Physician: Dion Body, MD  Duration of encounter: 38 minutes.  Total time on encounter, as per AMA guidelines included both the face-to-face and non-face-to-face time personally spent by the physician and/or other qualified health care professional(s) on the day of the encounter (includes time in activities that require the physician or other qualified health care professional and does not include time in activities normally performed by clinical staff). Physician's time may include the following activities when performed: Preparing to see the patient (e.g., pre-charting review of records, searching for previously ordered imaging, lab work, and nerve conduction tests) Review of prior analgesic pharmacotherapies. Reviewing PMP Interpreting ordered tests (e.g., lab work, imaging, nerve conduction tests) Performing post-procedure evaluations, including interpretation of diagnostic procedures Obtaining and/or reviewing separately obtained history Performing a medically appropriate examination and/or evaluation Counseling and educating the patient/family/caregiver Ordering  medications, tests, or procedures Referring and communicating with other health care professionals (when not separately reported) Documenting clinical information in the electronic or other health record Independently interpreting results (not separately reported) and communicating results to the patient/ family/caregiver Care coordination (not separately reported)  Note by: Gaspar Cola, MD (TTS technology used. I apologize for any typographical errors that were not detected and corrected.) Date: 09/05/2022; Time: 4:12 PM

## 2022-09-05 NOTE — Patient Instructions (Addendum)
Before scheduling the patient for the facet block, make sure that she is not having an active infection. ______________________________________________________________________  Procedure instructions  Do not eat or drink fluids (other than water) for 6 hours before your procedure  No water for 2 hours before your procedure  Take your blood pressure medicine with a sip of water  Arrive 30 minutes before your appointment  Carefully read the "Preparing for your procedure" detailed instructions  If you have questions call us at [phone]  _____________________________________________________________________    ______________________________________________________________________  Preparing for your procedure  During your procedure appointment there will be: No Prescription Refills. No disability issues to discussed. No medication changes or discussions.  Instructions: Food intake: Avoid eating anything solid for at least 8 hours prior to your procedure. Clear liquid intake: You may take clear liquids such as water up to 2 hours prior to your procedure. (No carbonated drinks. No soda.) Transportation: Unless otherwise stated by your physician, bring a driver. Morning Medicines: Except for blood thinners, take all of your other morning medications with a sip of water. Make sure to take your heart and blood pressure medicines. If your blood pressure's lower number is above 100, the case will be rescheduled. Blood thinners: Make sure to stop your blood thinners as instructed.  If you take a blood thinner, but were not instructed to stop it, call our office [phone] and ask to talk to a nurse. Not stopping a blood thinner prior to certain procedures could lead to serious complications. Diabetics on insulin: Notify the staff so that you can be scheduled 1st case in the morning. If your diabetes requires high dose insulin, take only  of your normal insulin dose the morning of the  procedure and notify the staff that you have done so. Preventing infections: Shower with an antibacterial soap the morning of your procedure.  Build-up your immune system: Take 1000 mg of Vitamin C with every meal (3 times a day) the day prior to your procedure. Antibiotics: Inform the nursing staff if you are taking any antibiotics or if you have any conditions that may require antibiotics prior to procedures. (Example: recent joint implants)   Pregnancy: If you are pregnant make sure to notify the nursing staff. Not doing so may result in injury to the fetus, including death.  Sickness: If you have a cold, fever, or any active infections, call and cancel or reschedule your procedure. Receiving steroids while having an infection may result in complications. Arrival: You must be in the facility at least 30 minutes prior to your scheduled procedure. Tardiness: Your scheduled time is also the cutoff time. If you do not arrive at least 15 minutes prior to your procedure, you will be rescheduled.  Children: Do not bring any children with you. Make arrangements to keep them home. Dress appropriately: There is always a possibility that your clothing may get soiled. Avoid long dresses. Valuables: Do not bring any jewelry or valuables.  Reasons to call and reschedule or cancel your procedure: (Following these recommendations will minimize the risk of a serious complication.) Surgeries: Avoid having procedures within 2 weeks of any surgery. (Avoid for 2 weeks before or after any surgery). Flu Shots: Avoid having procedures within 2 weeks of a flu shots or . (Avoid for 2 weeks before or after immunizations). Barium: Avoid having a procedure within 7-10 days after having had a radiological study involving the use of radiological contrast. (Myelograms, Barium swallow or enema study). Heart attacks: Avoid any  elective procedures or surgeries for the initial 6 months after a "Myocardial Infarction" (Heart  Attack). Blood thinners: It is imperative that you stop these medications before procedures. Let us know if you if you take any blood thinner.  Infection: Avoid procedures during or within two weeks of an infection (including chest colds or gastrointestinal problems). Symptoms associated with infections include: Localized redness, fever, chills, night sweats or profuse sweating, burning sensation when voiding, cough, congestion, stuffiness, runny nose, sore throat, diarrhea, nausea, vomiting, cold or Flu symptoms, recent or current infections. It is specially important if the infection is over the area that we intend to treat. Heart and lung problems: Symptoms that may suggest an active cardiopulmonary problem include: cough, chest pain, breathing difficulties or shortness of breath, dizziness, ankle swelling, uncontrolled high or unusually low blood pressure, and/or palpitations. If you are experiencing any of these symptoms, cancel your procedure and contact your primary care physician for an evaluation.  Remember:  Regular Business hours are:  Monday to Thursday 8:00 AM to 4:00 PM  Provider's Schedule: Milinda Pointer, MD:  Procedure days: Tuesday and Thursday 7:30 AM to 4:00 PM  Gillis Santa, MD:  Procedure days: Monday and Wednesday 7:30 AM to 4:00 PM  ______________________________________________________________________    ____________________________________________________________________________________________  General Risks and Possible Complications  Patient Responsibilities: It is important that you read this as it is part of your informed consent. It is our duty to inform you of the risks and possible complications associated with treatments offered to you. It is your responsibility as a patient to read this and to ask questions about anything that is not clear or that you believe was not covered in this document.  Patient's Rights: You have the right to refuse treatment.  You also have the right to change your mind, even after initially having agreed to have the treatment done. However, under this last option, if you wait until the last second to change your mind, you may be charged for the materials used up to that point.  Introduction: Medicine is not an Chief Strategy Officer. Everything in Medicine, including the lack of treatment(s), carries the potential for danger, harm, or loss (which is by definition: Risk). In Medicine, a complication is a secondary problem, condition, or disease that can aggravate an already existing one. All treatments carry the risk of possible complications. The fact that a side effects or complications occurs, does not imply that the treatment was conducted incorrectly. It must be clearly understood that these can happen even when everything is done following the highest safety standards.  No treatment: You can choose not to proceed with the proposed treatment alternative. The "PRO(s)" would include: avoiding the risk of complications associated with the therapy. The "CON(s)" would include: not getting any of the treatment benefits. These benefits fall under one of three categories: diagnostic; therapeutic; and/or palliative. Diagnostic benefits include: getting information which can ultimately lead to improvement of the disease or symptom(s). Therapeutic benefits are those associated with the successful treatment of the disease. Finally, palliative benefits are those related to the decrease of the primary symptoms, without necessarily curing the condition (example: decreasing the pain from a flare-up of a chronic condition, such as incurable terminal cancer).  General Risks and Complications: These are associated to most interventional treatments. They can occur alone, or in combination. They fall under one of the following six (6) categories: no benefit or worsening of symptoms; bleeding; infection; nerve damage; allergic reactions; and/or death. No  benefits or worsening  of symptoms: In Medicine there are no guarantees, only probabilities. No healthcare provider can ever guarantee that a medical treatment will work, they can only state the probability that it may. Furthermore, there is always the possibility that the condition may worsen, either directly, or indirectly, as a consequence of the treatment. Bleeding: This is more common if the patient is taking a blood thinner, either prescription or over the counter (example: Goody Powders, Fish oil, Aspirin, Garlic, etc.), or if suffering a condition associated with impaired coagulation (example: Hemophilia, cirrhosis of the liver, low platelet counts, etc.). However, even if you do not have one on these, it can still happen. If you have any of these conditions, or take one of these drugs, make sure to notify your treating physician. Infection: This is more common in patients with a compromised immune system, either due to disease (example: diabetes, cancer, human immunodeficiency virus [HIV], etc.), or due to medications or treatments (example: therapies used to treat cancer and rheumatological diseases). However, even if you do not have one on these, it can still happen. If you have any of these conditions, or take one of these drugs, make sure to notify your treating physician. Nerve Damage: This is more common when the treatment is an invasive one, but it can also happen with the use of medications, such as those used in the treatment of cancer. The damage can occur to small secondary nerves, or to large primary ones, such as those in the spinal cord and brain. This damage may be temporary or permanent and it may lead to impairments that can range from temporary numbness to permanent paralysis and/or brain death. Allergic Reactions: Any time a substance or material comes in contact with our body, there is the possibility of an allergic reaction. These can range from a mild skin rash (contact dermatitis)  to a severe systemic reaction (anaphylactic reaction), which can result in death. Death: In general, any medical intervention can result in death, most of the time due to an unforeseen complication. ____________________________________________________________________________________________

## 2022-09-08 ENCOUNTER — Encounter (INDEPENDENT_AMBULATORY_CARE_PROVIDER_SITE_OTHER): Payer: Self-pay

## 2022-09-08 ENCOUNTER — Encounter (INDEPENDENT_AMBULATORY_CARE_PROVIDER_SITE_OTHER): Payer: Self-pay | Admitting: Vascular Surgery

## 2022-09-09 DIAGNOSIS — S8002XA Contusion of left knee, initial encounter: Secondary | ICD-10-CM | POA: Diagnosis not present

## 2022-09-09 DIAGNOSIS — S60212A Contusion of left wrist, initial encounter: Secondary | ICD-10-CM | POA: Diagnosis not present

## 2022-09-15 ENCOUNTER — Ambulatory Visit: Payer: PPO | Attending: Pain Medicine | Admitting: Pain Medicine

## 2022-09-15 ENCOUNTER — Ambulatory Visit
Admission: RE | Admit: 2022-09-15 | Discharge: 2022-09-15 | Disposition: A | Discharge status: Home / Self Care | Payer: PPO | Source: Ambulatory Visit | Attending: Pain Medicine | Admitting: Pain Medicine

## 2022-09-15 VITALS — BP 127/59 | HR 68 | Temp 97.1°F | Resp 14 | Ht 64.0 in | Wt 143.0 lb

## 2022-09-15 DIAGNOSIS — M47817 Spondylosis without myelopathy or radiculopathy, lumbosacral region: Secondary | ICD-10-CM | POA: Diagnosis not present

## 2022-09-15 DIAGNOSIS — M545 Low back pain, unspecified: Secondary | ICD-10-CM

## 2022-09-15 DIAGNOSIS — G8929 Other chronic pain: Secondary | ICD-10-CM | POA: Diagnosis not present

## 2022-09-15 DIAGNOSIS — R937 Abnormal findings on diagnostic imaging of other parts of musculoskeletal system: Secondary | ICD-10-CM | POA: Diagnosis not present

## 2022-09-15 DIAGNOSIS — M51379 Other intervertebral disc degeneration, lumbosacral region without mention of lumbar back pain or lower extremity pain: Secondary | ICD-10-CM

## 2022-09-15 DIAGNOSIS — M5137 Other intervertebral disc degeneration, lumbosacral region: Secondary | ICD-10-CM | POA: Insufficient documentation

## 2022-09-15 DIAGNOSIS — M47816 Spondylosis without myelopathy or radiculopathy, lumbar region: Secondary | ICD-10-CM | POA: Diagnosis not present

## 2022-09-15 MED ORDER — ROPIVACAINE HCL 2 MG/ML IJ SOLN
18.0000 mL | Freq: Once | INTRAMUSCULAR | Status: AC
  Administered 2022-09-15: 18 mL via PERINEURAL

## 2022-09-15 MED ORDER — TRIAMCINOLONE ACETONIDE 40 MG/ML IJ SUSP
80.0000 mg | Freq: Once | INTRAMUSCULAR | Status: AC
  Administered 2022-09-15: 80 mg

## 2022-09-15 MED ORDER — TRIAMCINOLONE ACETONIDE 40 MG/ML IJ SUSP
INTRAMUSCULAR | Status: AC
  Filled 2022-09-15: qty 2

## 2022-09-15 MED ORDER — LIDOCAINE HCL 2 % IJ SOLN
INTRAMUSCULAR | Status: AC
  Filled 2022-09-15: qty 20

## 2022-09-15 MED ORDER — LIDOCAINE HCL 2 % IJ SOLN: 20.0000 mL | Freq: Once | INTRAMUSCULAR | Status: DC

## 2022-09-15 MED ORDER — LACTATED RINGERS IV SOLN: Freq: Once | INTRAVENOUS | Status: AC

## 2022-09-15 MED ORDER — PENTAFLUOROPROP-TETRAFLUOROETH EX AERO: INHALATION_SPRAY | Freq: Once | CUTANEOUS | Status: DC

## 2022-09-15 MED ORDER — MIDAZOLAM HCL 5 MG/5ML IJ SOLN
0.5000 mg | Freq: Once | INTRAMUSCULAR | Status: AC
  Administered 2022-09-15: 2 mg via INTRAVENOUS

## 2022-09-15 MED ORDER — FENTANYL CITRATE (PF) 100 MCG/2ML IJ SOLN
INTRAMUSCULAR | Status: AC
  Filled 2022-09-15: qty 2

## 2022-09-15 MED ORDER — ROPIVACAINE HCL 2 MG/ML IJ SOLN
INTRAMUSCULAR | Status: AC
  Filled 2022-09-15: qty 20

## 2022-09-15 MED ORDER — FENTANYL CITRATE (PF) 100 MCG/2ML IJ SOLN
25.0000 ug | INTRAMUSCULAR | Status: DC | PRN
  Administered 2022-09-15: 50 ug via INTRAVENOUS

## 2022-09-15 MED ORDER — MIDAZOLAM HCL 5 MG/5ML IJ SOLN
INTRAMUSCULAR | Status: AC
  Filled 2022-09-15: qty 5

## 2022-09-15 NOTE — Patient Instructions (Signed)

## 2022-09-15 NOTE — Progress Notes (Signed)
PROVIDER NOTE: Interpretation of information contained herein should be left to medically-trained personnel. Specific patient instructions are provided elsewhere under "Patient Instructions" section of medical record. This document was created in part using STT-dictation technology, any transcriptional errors that may result from this process are unintentional.  Patient: Alyssa Vega Type: Established DOB: 11-Sep-1955 MRN: [id number] PCP: Dion Body, MD  Service: Procedure DOS: 09/15/2022 Setting: Ambulatory Location: Ambulatory outpatient facility Delivery: Face-to-face Provider: Gaspar Cola, MD Specialty: Interventional Pain Management Specialty designation: 09 Location: Outpatient facility Ref. Prov.: Dion Body, MD       Interventional Therapy   Procedure: Lumbar Facet, Medial Branch Block(s) #1  Laterality: Bilateral  Left side Level: L3, L4, L5, and S1 Medial Branch Level(s). Injecting these levels blocks the L4-5 and L5-S1 lumbar facet joints. Right side Level: L2, L3, L4, L5, and S1 Medial Branch Level(s). Injecting these levels blocks the L3-4, L4-5, and L5-S1 lumbar facet joints. Imaging: Fluoroscopic guidance         Anesthesia: Local anesthesia (1-2% Lidocaine) Anxiolysis: IV Versed 2.0 mg Sedation: Moderate Sedation Fentanyl 1 mL (50 mcg) DOS: 09/15/2022 Performed by: Gaspar Cola, MD  Primary Purpose: Diagnostic/Therapeutic Indications: Low back pain severe enough to impact quality of life or function. 1. Lumbar facet joint syndrome   2. Lumbar facet arthropathy (Multilevel) (Bilateral)   3. Spondylosis without myelopathy or radiculopathy, lumbosacral region   4. Chronic low back pain (1ry area of Pain) (Bilateral) (R>L) w/o sciatica   5. DDD (degenerative disc disease), lumbosacral   6. DJD (degenerative joint disease), lumbosacral   7. Abnormal MRI, lumbar spine (03/20/2021)    NAS-11 Pain score:   Pre-procedure: 8 /10    Post-procedure: 0-No pain/10     Position / Prep / Materials:  Position: Prone  Prep solution: DuraPrep (Iodine Povacrylex [0.7% available iodine] and Isopropyl Alcohol, 74% w/w) Area Prepped: Posterolateral Lumbosacral Spine (Wide prep: From the lower border of the scapula down to the end of the tailbone and from flank to flank.)  Materials:  Tray: Block Needle(s):  Type: Spinal  Gauge (G): 22  Length: 5-in Qty: 4      Pre-op H&P Assessment:  Alyssa Vega is a 67 y.o. (year old), female patient, seen today for interventional treatment. She  has a past surgical history that includes Ovarian cyst removal; Colonoscopy with propofol (N/A, 04/20/2020); and Esophagogastroduodenoscopy (egd) with propofol (N/A, 04/20/2020). Alyssa Vega has a current medication list which includes the following prescription(s): alendronate, diclofenac, ezetimibe, glucosamine-chondroitin, multivitamin, naproxen sodium, and pantoprazole, and the following Facility-Administered Medications: fentanyl, lidocaine, and pentafluoroprop-tetrafluoroeth. Her primarily concern today is the Back Pain (low)  Initial Vital Signs:  Pulse/HCG Rate: 68ECG Heart Rate: 74 (NSR) Temp: (!) 96.6 F (35.9 C) Resp: 16 BP: (!) 140/74 SpO2: 99 %  BMI: Estimated body mass index is 24.55 kg/m as calculated from the following:   Height as of this encounter: '5\' 4"'$  (1.626 m).   Weight as of this encounter: 143 lb (64.9 kg).  Risk Assessment: Allergies: Reviewed. She is allergic to duloxetine, atorvastatin, cyclobenzaprine, ibuprofen, melatonin, meloxicam, tizanidine, and venlafaxine.  Allergy Precautions: None required Coagulopathies: Reviewed. None identified.  Blood-thinner therapy: None at this time Active Infection(s): Reviewed. None identified. Alyssa Vega is afebrile  Site Confirmation: Alyssa Vega was asked to confirm the procedure and laterality before marking the site Procedure checklist: Completed Consent: Before the procedure  and under the influence of no sedative(s), amnesic(s), or anxiolytics, the patient was informed of the treatment options, risks  and possible complications. To fulfill our ethical and legal obligations, as recommended by the American Medical Association's Code of Ethics, I have informed the patient of my clinical impression; the nature and purpose of the treatment or procedure; the risks, benefits, and possible complications of the intervention; the alternatives, including doing nothing; the risk(s) and benefit(s) of the alternative treatment(s) or procedure(s); and the risk(s) and benefit(s) of doing nothing. The patient was provided information about the general risks and possible complications associated with the procedure. These may include, but are not limited to: failure to achieve desired goals, infection, bleeding, organ or nerve damage, allergic reactions, paralysis, and death. In addition, the patient was informed of those risks and complications associated to Spine-related procedures, such as failure to decrease pain; infection (i.e.: Meningitis, epidural or intraspinal abscess); bleeding (i.e.: epidural hematoma, subarachnoid hemorrhage, or any other type of intraspinal or peri-dural bleeding); organ or nerve damage (i.e.: Any type of peripheral nerve, nerve root, or spinal cord injury) with subsequent damage to sensory, motor, and/or autonomic systems, resulting in permanent pain, numbness, and/or weakness of one or several areas of the body; allergic reactions; (i.e.: anaphylactic reaction); and/or death. Furthermore, the patient was informed of those risks and complications associated with the medications. These include, but are not limited to: allergic reactions (i.e.: anaphylactic or anaphylactoid reaction(s)); adrenal axis suppression; blood sugar elevation that in diabetics may result in ketoacidosis or comma; water retention that in patients with history of congestive heart failure may result  in shortness of breath, pulmonary edema, and decompensation with resultant heart failure; weight gain; swelling or edema; medication-induced neural toxicity; particulate matter embolism and blood vessel occlusion with resultant organ, and/or nervous system infarction; and/or aseptic necrosis of one or more joints. Finally, the patient was informed that Medicine is not an exact science; therefore, there is also the possibility of unforeseen or unpredictable risks and/or possible complications that may result in a catastrophic outcome. The patient indicated having understood very clearly. We have given the patient no guarantees and we have made no promises. Enough time was given to the patient to ask questions, all of which were answered to the patient's satisfaction. Ms. Sotak has indicated that she wanted to continue with the procedure. Attestation: I, the ordering provider, attest that I have discussed with the patient the benefits, risks, side-effects, alternatives, likelihood of achieving goals, and potential problems during recovery for the procedure that I have provided informed consent. Date  Time: 09/15/2022  8:30 AM   Pre-Procedure Preparation:  Monitoring: As per clinic protocol. Respiration, ETCO2, SpO2, BP, heart rate and rhythm monitor placed and checked for adequate function Safety Precautions: Patient was assessed for positional comfort and pressure points before starting the procedure. Time-out: I initiated and conducted the "Time-out" before starting the procedure, as per protocol. The patient was asked to participate by confirming the accuracy of the "Time Out" information. Verification of the correct person, site, and procedure were performed and confirmed by me, the nursing staff, and the patient. "Time-out" conducted as per Joint Commission's Universal Protocol (UP.01.01.01). Time: 0952  Description of Procedure:          Laterality: (see above) Targeted Levels: (see  above)  Safety Precautions: Aspiration looking for blood return was conducted prior to all injections. At no point did we inject any substances, as a needle was being advanced. Before injecting, the patient was told to immediately notify me if she was experiencing any new onset of "ringing in the ears, or metallic taste  in the mouth". No attempts were made at seeking any paresthesias. Safe injection practices and needle disposal techniques used. Medications properly checked for expiration dates. SDV (single dose vial) medications used. After the completion of the procedure, all disposable equipment used was discarded in the proper designated medical waste containers. Local Anesthesia: Protocol guidelines were followed. The patient was positioned over the fluoroscopy table. The area was prepped in the usual manner. The time-out was completed. The target area was identified using fluoroscopy. A 12-in long, straight, sterile hemostat was used with fluoroscopic guidance to locate the targets for each level blocked. Once located, the skin was marked with an approved surgical skin marker. Once all sites were marked, the skin (epidermis, dermis, and hypodermis), as well as deeper tissues (fat, connective tissue and muscle) were infiltrated with a small amount of a short-acting local anesthetic, loaded on a 10cc syringe with a 25G, 1.5-in  Needle. An appropriate amount of time was allowed for local anesthetics to take effect before proceeding to the next step. Local Anesthetic: Lidocaine 2.0% The unused portion of the local anesthetic was discarded in the proper designated containers. Technical description of process:  L2 Medial Branch Nerve Block (MBB): The target area for the L2 medial branch is at the junction of the postero-lateral aspect of the superior articular process and the superior, posterior, and medial edge of the transverse process of L3. Under fluoroscopic guidance, a Quincke needle was inserted until  contact was made with os over the superior postero-lateral aspect of the pedicular shadow (target area). After negative aspiration for blood, 0.5 mL of the nerve block solution was injected without difficulty or complication. The needle was removed intact. L3 Medial Branch Nerve Block (MBB): The target area for the L3 medial branch is at the junction of the postero-lateral aspect of the superior articular process and the superior, posterior, and medial edge of the transverse process of L4. Under fluoroscopic guidance, a Quincke needle was inserted until contact was made with os over the superior postero-lateral aspect of the pedicular shadow (target area). After negative aspiration for blood, 0.5 mL of the nerve block solution was injected without difficulty or complication. The needle was removed intact. L4 Medial Branch Nerve Block (MBB): The target area for the L4 medial branch is at the junction of the postero-lateral aspect of the superior articular process and the superior, posterior, and medial edge of the transverse process of L5. Under fluoroscopic guidance, a Quincke needle was inserted until contact was made with os over the superior postero-lateral aspect of the pedicular shadow (target area). After negative aspiration for blood, 0.5 mL of the nerve block solution was injected without difficulty or complication. The needle was removed intact. L5 Medial Branch Nerve Block (MBB): The target area for the L5 medial branch is at the junction of the postero-lateral aspect of the superior articular process and the superior, posterior, and medial edge of the sacral ala. Under fluoroscopic guidance, a Quincke needle was inserted until contact was made with os over the superior postero-lateral aspect of the pedicular shadow (target area). After negative aspiration for blood, 0.5 mL of the nerve block solution was injected without difficulty or complication. The needle was removed intact. S1 Medial Branch Nerve  Block (MBB): The target area for the S1 medial branch is at the posterior and inferior 6 o'clock position of the L5-S1 facet joint. Under fluoroscopic guidance, the Quincke needle inserted for the L5 MBB was redirected until contact was made with os over the  inferior and postero aspect of the sacrum, at the 6 o' clock position under the L5-S1 facet joint (Target area). After negative aspiration for blood, 0.5 mL of the nerve block solution was injected without difficulty or complication. The needle was removed intact.  Once the entire procedure was completed, the treated area was cleaned, making sure to leave some of the prepping solution back to take advantage of its long term bactericidal properties.         Illustration of the posterior view of the lumbar spine and the posterior neural structures. Laminae of L2 through S1 are labeled. DPRL5, dorsal primary ramus of L5; DPRS1, dorsal primary ramus of S1; DPR3, dorsal primary ramus of L3; FJ, facet (zygapophyseal) joint L3-L4; I, inferior articular process of L4; LB1, lateral branch of dorsal primary ramus of L1; IAB, inferior articular branches from L3 medial branch (supplies L4-L5 facet joint); IBP, intermediate branch plexus; MB3, medial branch of dorsal primary ramus of L3; NR3, third lumbar nerve root; S, superior articular process of L5; SAB, superior articular branches from L4 (supplies L4-5 facet joint also); TP3, transverse process of L3.  Vitals:   09/15/22 1005 09/15/22 1010 09/15/22 1020 09/15/22 1029  BP: 117/67 (!) 124/56 126/60 (!) 127/59  Pulse:      Resp: '18 12 14 14  '$ Temp:  (!) 97.2 F (36.2 C)  (!) 97.1 F (36.2 C)  SpO2: 100% 97% 99% 100%  Weight:      Height:         Start Time: 0952 hrs. End Time: 1003 hrs.  Imaging Guidance (Spinal):          Type of Imaging Technique: Fluoroscopy Guidance (Spinal) Indication(s): Assistance in needle guidance and placement for procedures requiring needle placement in or near  specific anatomical locations not easily accessible without such assistance. Exposure Time: Please see nurses notes. Contrast: None used. Fluoroscopic Guidance: I was personally present during the use of fluoroscopy. "Tunnel Vision Technique" used to obtain the best possible view of the target area. Parallax error corrected before commencing the procedure. "Direction-depth-direction" technique used to introduce the needle under continuous pulsed fluoroscopy. Once target was reached, antero-posterior, oblique, and lateral fluoroscopic projection used confirm needle placement in all planes. Images permanently stored in EMR.     Interpretation: No contrast injected. I personally interpreted the imaging intraoperatively. Adequate needle placement confirmed in multiple planes. Permanent images saved into the patient's record.  Post-operative Assessment:  Post-procedure Vital Signs:  Pulse/HCG Rate: 6860 Temp: (!) 97.1 F (36.2 C) Resp: 14 BP: (!) 127/59 SpO2: 100 %  EBL: None  Complications: No immediate post-treatment complications observed by team, or reported by patient.  Note: The patient tolerated the entire procedure well. A repeat set of vitals were taken after the procedure and the patient was kept under observation following institutional policy, for this type of procedure. Post-procedural neurological assessment was performed, showing return to baseline, prior to discharge. The patient was provided with post-procedure discharge instructions, including a section on how to identify potential problems. Should any problems arise concerning this procedure, the patient was given instructions to immediately contact us, at any time, without hesitation. In any case, we plan to contact the patient by telephone for a follow-up status report regarding this interventional procedure.  Comments:  No additional relevant information.  Plan of Care (POC)  Orders:  Orders Placed This Encounter   Procedures   LUMBAR FACET(MEDIAL BRANCH NERVE BLOCK) MBNB    Scheduling Instructions:  Procedure: Lumbar facet block (AKA.: Lumbosacral medial branch nerve block)     Side: Bilateral     Level: L3-4, L4-5, and L5-S1 Facets (L2, L3, L4, L5, and S1 Medial Branch Nerves)     Sedation: Patient's choice.     Timeframe: Today    Order Specific Question:   Where will this procedure be performed?    Answer:   [facility]   DG PAIN [facility] C-ARM 1-60 MIN NO REPORT    Intraoperative interpretation by procedural physician at [facility].    Standing Status:   Standing    Number of Occurrences:   1    Order Specific Question:   Reason for exam:    Answer:   Assistance in needle guidance and placement for procedures requiring needle placement in or near specific anatomical locations not easily accessible without such assistance.   Informed Consent Details: Physician/Practitioner Attestation; Transcribe to consent form and obtain patient signature    Nursing Order: Transcribe to consent form and obtain patient signature. Note: Always confirm laterality of pain with Ms. Peppel, before procedure.    Order Specific Question:   Physician/Practitioner attestation of informed consent for procedure/surgical case    Answer:   I, the physician/practitioner, attest that I have discussed with the patient the benefits, risks, side effects, alternatives, likelihood of achieving goals and potential problems during recovery for the procedure that I have provided informed consent.    Order Specific Question:   Procedure    Answer:   Lumbar Facet Block  under fluoroscopic guidance    Order Specific Question:   Physician/Practitioner performing the procedure    Answer:   Vivianna Piccini A. Dossie Arbour MD    Order Specific Question:   Indication/Reason    Answer:   Low Back Pain, with our without leg pain, due to Facet Joint Arthralgia (Joint Pain) Spondylosis (Arthritis of the Spine), without myelopathy or  radiculopathy (Nerve Damage).   Provide equipment / supplies at bedside    Procedure tray: "Block Tray" (Disposable  single use) Skin infiltration needle: Regular 1.5-in, 25-G, (x1) Block Needle type: Spinal Amount/quantity: 4 Size: Medium (5-inch) Gauge: 22G    Standing Status:   Standing    Number of Occurrences:   1    Order Specific Question:   Specify    Answer:   Block Tray   Chronic Opioid Analgesic:  None MME/day: 0 mg/day   Medications ordered for procedure: Meds ordered this encounter  Medications   lidocaine (XYLOCAINE) 2 % (with pres) injection 400 mg   pentafluoroprop-tetrafluoroeth (GEBAUERS) aerosol   lactated ringers infusion   midazolam (VERSED) 5 MG/5ML injection 0.5-2 mg    Make sure Flumazenil is available in the pyxis when using this medication. If oversedation occurs, administer 0.2 mg IV over 15 sec. If after 45 sec no response, administer 0.2 mg again over 1 min; may repeat at 1 min intervals; not to exceed 4 doses (1 mg)   fentaNYL (SUBLIMAZE) injection 25-50 mcg    Make sure Narcan is available in the pyxis when using this medication. In the event of respiratory depression (RR< 8/min): Titrate NARCAN (naloxone) in increments of 0.1 to 0.2 mg IV at 2-3 minute intervals, until desired degree of reversal.   ropivacaine (PF) 2 mg/mL (0.2%) (NAROPIN) injection 18 mL   triamcinolone acetonide (KENALOG-40) injection 80 mg   Medications administered: We administered lactated ringers, midazolam, fentaNYL, ropivacaine (PF) 2 mg/mL (0.2%), and triamcinolone acetonide.  See the medical record for exact  dosing, route, and time of administration.  Follow-up plan:   Return in about 2 weeks (around 09/29/2022) for Proc-day (T,Th), (Face2F), (PPE).       Interventional Therapies  Risk Factors  Considerations:    Most pain described to be from this level down except on right.    Planned  Pending:      Under consideration:   Pending completion of evaluation    Completed:   None at this time   Completed by other providers:   By Dr. Sharlet Salina Therapeutic right L4-5 TFESI x2 (03/19/2021) by Sharlet Salina, DO [facility]R)  Therapeutic right L5-S1 TFESI x1 (03/19/2021) by Sharlet Salina, DO [facility] PMR)  Therapeutic right C6-7 TFESI x3 (05/19/2020) by Sharlet Salina, DO [facility] PMR)  Therapeutic right C6-7 TFESI x1 (04/30/2021) by Sharlet Salina, DO [facility] PMR)  Therapeutic bilateral IA steroid knee inj. x1 (07/30/2021) by Tamala Julian, PA ([facility])    Therapeutic  Palliative (PRN) options:   None established        Recent Visits Date Type Provider Dept  09/05/22 Office Visit Milinda Pointer, MD [facility]  08/01/22 Office Visit Milinda Pointer, MD [facility]  Showing recent visits within past 90 days and meeting all other requirements Today's Visits Date Type Provider Dept  09/15/22 Procedure visit Milinda Pointer, MD [facility]  Showing today's visits and meeting all other requirements Future Appointments Date Type Provider Dept  09/29/22 Appointment Milinda Pointer, MD [facility]  Showing future appointments within next 90 days and meeting all other requirements  Disposition: Discharge home  Discharge (Date  Time): 09/15/2022; 1030 hrs.   Primary Care Physician: Dion Body, MD Location: [facility] Note by: Gaspar Cola, MD (TTS technology used. I apologize for any typographical errors that were not detected and corrected.) Date: 09/15/2022; Time: 11:14 AM  Disclaimer:  Medicine is not an Chief Strategy Officer. The only guarantee in medicine is that nothing is guaranteed. It is important to note that the decision to proceed with this intervention was based on the information collected from the patient. The Data and conclusions were drawn from the patient's questionnaire, the interview, and the physical examination. Because the information was  provided in large part by the patient, it cannot be guaranteed that it has not been purposely or unconsciously manipulated. Every effort has been made to obtain as much relevant data as possible for this evaluation. It is important to note that the conclusions that lead to this procedure are derived in large part from the available data. Always take into account that the treatment will also be dependent on availability of resources and existing treatment guidelines, considered by other Pain Management Practitioners as being common knowledge and practice, at the time of the intervention. For Medico-Legal purposes, it is also important to point out that variation in procedural techniques and pharmacological choices are the acceptable norm. The indications, contraindications, technique, and results of the above procedure should only be interpreted and judged by a Board-Certified Interventional Pain Specialist with extensive familiarity and expertise in the same exact procedure and technique.

## 2022-09-16 ENCOUNTER — Telehealth: Payer: Self-pay

## 2022-09-16 NOTE — Telephone Encounter (Signed)
Post procedure follow up.  Patient states she is doing good.  

## 2022-09-29 ENCOUNTER — Ambulatory Visit: Payer: PPO | Attending: Pain Medicine | Admitting: Pain Medicine

## 2022-09-29 ENCOUNTER — Encounter: Payer: Self-pay | Admitting: Pain Medicine

## 2022-09-29 VITALS — BP 136/70 | HR 71 | Temp 97.3°F | Ht 63.0 in | Wt 143.0 lb

## 2022-09-29 DIAGNOSIS — M25552 Pain in left hip: Secondary | ICD-10-CM | POA: Insufficient documentation

## 2022-09-29 DIAGNOSIS — M25551 Pain in right hip: Secondary | ICD-10-CM | POA: Diagnosis not present

## 2022-09-29 DIAGNOSIS — G8929 Other chronic pain: Secondary | ICD-10-CM | POA: Insufficient documentation

## 2022-09-29 DIAGNOSIS — M25561 Pain in right knee: Secondary | ICD-10-CM | POA: Diagnosis not present

## 2022-09-29 DIAGNOSIS — M47816 Spondylosis without myelopathy or radiculopathy, lumbar region: Secondary | ICD-10-CM | POA: Insufficient documentation

## 2022-09-29 DIAGNOSIS — M25562 Pain in left knee: Secondary | ICD-10-CM | POA: Diagnosis not present

## 2022-09-29 DIAGNOSIS — M545 Low back pain, unspecified: Secondary | ICD-10-CM | POA: Insufficient documentation

## 2022-09-29 NOTE — Progress Notes (Signed)
Safety precautions to be maintained throughout the outpatient stay will include: orient to surroundings, keep bed in low position, maintain call bell within reach at all times, provide assistance with transfer out of bed and ambulation.  

## 2022-09-29 NOTE — Progress Notes (Signed)
PROVIDER NOTE: Information contained herein reflects review and annotations entered in association with encounter. Interpretation of such information and data should be left to medically-trained personnel. Information provided to patient can be located elsewhere in the medical record under "Patient Instructions". Document created using STT-dictation technology, any transcriptional errors that may result from process are unintentional.    Patient: Alyssa Vega  Service Category: E/M  Provider: Gaspar Cola, MD  DOB: [birth year]  DOS: 09/29/2022  Referring Provider: Dion Body, MD  MRN: [id number]  Specialty: Interventional Pain Management  PCP: Dion Body, MD  Type: Established Patient  Setting: Ambulatory outpatient    Location: Office  Delivery: Face-to-face     HPI  Alyssa Vega, a 67 y.o. year old female, is here today because of her Chronic bilateral low back pain without sciatica [M54.50, G89.29]. Ms. Schalow primary complain today is Hip Pain (both)  Pertinent problems: Ms. Reome has Abdominal pain, generalized; Lower abdominal pain; Cervical radiculopathy; Chronic daily headache; LLQ pain; Chronic pain syndrome; Chronic low back pain (1ry area of Pain) (Bilateral) (R>L) w/o sciatica; Chronic hip pain (2ry area of Pain) (Bilateral) (L>R); Chronic knee pain (3ry area of Pain) (Bilateral) (R>L); Chronic neck and back pain (4th area of Pain) (Bilateral) (L>R); Chronic upper back pain (5th area of Pain) (Bilateral); Chronic shoulder pain (6th area of Pain) (Bilateral) (L>R); Cervicogenic headache (7th area of Pain) (Bilateral) (R>L); Cervico-occipital neuralgia (Right); Chronic feet pain (8th area of Pain) (Bilateral) (R>L); Abnormal MRI, lumbar spine (03/20/2021); Abnormal MRI, cervical spine (03/20/2021); Cellulitis of forearm (Left); Traumatic hematoma of left forearm; Lumbar facet joint syndrome; Lumbar facet arthropathy (Multilevel) (Bilateral); Spondylosis without myelopathy  or radiculopathy, cervical region; DDD (degenerative disc disease), cervical; Cervical foraminal stenosis (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-7); Cervical facet arthropathy (Multilevel) (Bilateral); Cervical facet syndrome; Grade 1 Retrolisthesis of C6/C7; Cervical facet hypertrophy (C6-7); Spondylosis without myelopathy or radiculopathy, lumbosacral region; DDD (degenerative disc disease), lumbosacral; and DJD (degenerative joint disease), lumbosacral on their pertinent problem list. Pain Assessment: Severity of Chronic pain is reported as a 8 /10. Location: Hip Left, Right/pain radiaities down both leg to her feet. Onset: More than a month ago. Quality: Aching, Burning, Constant, Throbbing. Timing: Constant. Modifying factor(s): walking. Vitals:  height is '5\' 3"'$  (1.6 m) and weight is 143 lb (64.9 kg). Her temperature is 97.3 F (36.3 C) (abnormal). Her blood pressure is 136/70 and her pulse is 71. Her oxygen saturation is 98%.  BMI: Estimated body mass index is 25.33 kg/m as calculated from the following:   Height as of this encounter: '5\' 3"'$  (1.6 m).   Weight as of this encounter: 143 lb (64.9 kg). Last encounter: 09/05/2022. Last procedure: 09/15/2022.  Reason for encounter: post-procedure evaluation and assessment.  The patient returns to the clinic today indicating having attained 100% relief of the pain for the duration of the local anesthetic followed by a decrease of benefit to 50% relief that lasted for approximately 3 days.  More importantly is the fact that during the time that the local anesthetic was in effect, not only did she get good relief of her low back pain but she also experienced no lower extremity pain.  This would suggest that the patient's lower extremity pain is in fact referred from the lower back.  At this point the plan is to do the second diagnostic lumbar facet block and determine if the results are concordant with those seen with the 09/15/2022 diagnostic injection.  If they are, then we  will  move onto radiofrequency ablation.  Again the patient currently is experiencing no lower extremity numbness or weakness suggestive of any type of lumbar radiculopathy.  The patient's primary area of pain is axial.  Today I spent quite a bit of time with the patient going over the results of the diagnostic injection.  I took the time to explain to her the role of the local anesthetic and the steroids and the diagnosis of these conditions.  She also had some questions regarding a once a year injection for the treatment of arthritis.  I have explained to the patient that the injection that she is referring to is likely to be associated with rheumatoid arthritis as opposed to the osteoarthritis that she is having.  I have explained to the patient that as part of her original workup we did a C-reactive protein and a sed rate evaluation were both of them came back within normal limits indicating a low probability of autoimmune disease.  I took the time to explain all this to the patient.  In addition to the above, I also took the time to explain to the patient the full process of having the second diagnostic injection and if that injection is concordant to the first, how we would then move onto radiofrequency ablation.  I explained to her what that particular treatment involved and what the difference between that and the diagnostic injections was.  She indicated having understood.  Post-procedure evaluation   Procedure: Lumbar Facet, Medial Branch Block(s) #1  Laterality: Bilateral  Left side Level: L3, L4, L5, and S1 Medial Branch Level(s). Injecting these levels blocks the L4-5 and L5-S1 lumbar facet joints. Right side Level: L2, L3, L4, L5, and S1 Medial Branch Level(s). Injecting these levels blocks the L3-4, L4-5, and L5-S1 lumbar facet joints. Imaging: Fluoroscopic guidance         Anesthesia: Local anesthesia (1-2% Lidocaine) Anxiolysis: IV Versed 2.0 mg Sedation: Moderate Sedation Fentanyl 1 mL  (50 mcg) DOS: 09/15/2022 Performed by: Gaspar Cola, MD  Primary Purpose: Diagnostic/Therapeutic Indications: Low back pain severe enough to impact quality of life or function. 1. Lumbar facet joint syndrome   2. Lumbar facet arthropathy (Multilevel) (Bilateral)   3. Spondylosis without myelopathy or radiculopathy, lumbosacral region   4. Chronic low back pain (1ry area of Pain) (Bilateral) (R>L) w/o sciatica   5. DDD (degenerative disc disease), lumbosacral   6. DJD (degenerative joint disease), lumbosacral   7. Abnormal MRI, lumbar spine (03/20/2021)    NAS-11 Pain score:   Pre-procedure: 8 /10   Post-procedure: 0-No pain/10      Effectiveness:  Initial hour after procedure: 100 %. Subsequent 4-6 hours post-procedure: 100 %. Analgesia past initial 6 hours: 50 % (lasted for three days). Ongoing improvement:  Analgesic: The patient's pain has returned to baseline. Function: Back to baseline ROM: Back to baseline   Pharmacotherapy Assessment  Analgesic: None MME/day: 0 mg/day   Monitoring: [place] PMP: PDMP reviewed during this encounter.       Pharmacotherapy: No side-effects or adverse reactions reported. Compliance: No problems identified. Effectiveness: Clinically acceptable.  Chauncey Fischer, RN  09/29/2022  1:41 PM  Sign when Signing Visit Safety precautions to be maintained throughout the outpatient stay will include: orient to surroundings, keep bed in low position, maintain call bell within reach at all times, provide assistance with transfer out of bed and ambulation.     No results found for: "CBDTHCR" No results found for: "D8THCCBX" No results found for: "D9THCCBX"  UDS:  Summary  Date Value Ref Range Status  08/01/2022 Note  Final    Comment:    ==================================================================== Compliance Drug Analysis, Ur ==================================================================== Test                             Result        Flag       Units  Drug Absent but Declared for Prescription Verification   Diclofenac                     Not Detected UNEXPECTED    Diclofenac, as indicated in the declared medication list, is not    always detected even when used as directed.    Naproxen                       Not Detected UNEXPECTED ==================================================================== Test                      Result    Flag   Units      Ref Range   Creatinine              59               mg/dL      >=20 ==================================================================== Declared Medications:  The flagging and interpretation on this report are based on the  following declared medications.  Unexpected results may arise from  inaccuracies in the declared medications.   **Note: The testing scope of this panel includes these medications:   Naproxen (Aleve)   **Note: The testing scope of this panel does not include small to  moderate amounts of these reported medications:   Diclofenac (Voltaren)   **Note: The testing scope of this panel does not include the  following reported medications:   Alendronate (Fosamax)  Chondroitin  Ezetimibe (Zetia)  Glucosamine  Multivitamin  Pantoprazole (Protonix) ==================================================================== For clinical consultation, please call [phone]. ====================================================================       ROS  Constitutional: Denies any fever or chills Gastrointestinal: No reported hemesis, hematochezia, vomiting, or acute GI distress Musculoskeletal: Denies any acute onset joint swelling, redness, loss of ROM, or weakness Neurological: No reported episodes of acute onset apraxia, aphasia, dysarthria, agnosia, amnesia, paralysis, loss of coordination, or loss of consciousness  Medication Review  alendronate, diclofenac, ezetimibe, glucosamine-chondroitin, multivitamin, naproxen sodium, and  pantoprazole  History Review  Allergy: Ms. Vanwert is allergic to duloxetine, atorvastatin, cyclobenzaprine, ibuprofen, melatonin, meloxicam, tizanidine, and venlafaxine. Drug: Ms. Marsteller  reports no history of drug use. Alcohol:  reports no history of alcohol use. Tobacco:  reports that she has never smoked. She has never used smokeless tobacco. Social: Ms. Clamp  reports that she has never smoked. She has never used smokeless tobacco. She reports that she does not drink alcohol and does not use drugs. Medical:  has a past medical history of Arthritis. Surgical: Ms. Demoulin  has a past surgical history that includes Ovarian cyst removal; Colonoscopy with propofol (N/A, 04/20/2020); and Esophagogastroduodenoscopy (egd) with propofol (N/A, 04/20/2020). Family: family history is not on file.  Laboratory Chemistry Profile   Renal Lab Results  Component Value Date   BUN 12 02/11/2022   CREATININE 0.66 02/11/2022   GFRAA >60 04/05/2020   GFRNONAA >60 02/11/2022    Hepatic Lab Results  Component Value Date   AST 24 02/11/2022   ALT 21 02/11/2022   ALBUMIN 4.6 02/11/2022  ALKPHOS 59 02/11/2022   LIPASE 38 02/11/2022    Electrolytes Lab Results  Component Value Date   NA 138 02/11/2022   K 3.7 02/11/2022   CL 105 02/11/2022   CALCIUM 9.5 02/11/2022   MG 2.3 08/01/2022    Bone Lab Results  Component Value Date   25OHVITD1 41 08/01/2022   25OHVITD2 4.7 08/01/2022   25OHVITD3 36 08/01/2022    Inflammation (CRP: Acute Phase) (ESR: Chronic Phase) Lab Results  Component Value Date   CRP <1 08/01/2022   ESRSEDRATE 7 08/01/2022         Note: Above Lab results reviewed.  Recent Imaging Review  DG PAIN CLINIC C-ARM 1-60 MIN NO REPORT Fluoro was used, but no Radiologist interpretation will be provided.  Please refer to "NOTES" tab for provider progress note. Note: Reviewed        Physical Exam  General appearance: Well nourished, well developed, and well hydrated. In no  apparent acute distress Mental status: Alert, oriented x 3 (person, place, & time)       Respiratory: No evidence of acute respiratory distress Eyes: PERLA Vitals: BP 136/70   Pulse 71   Temp (!) 97.3 F (36.3 C)   Ht '5\' 3"'$  (1.6 m)   Wt 143 lb (64.9 kg)   SpO2 98%   BMI 25.33 kg/m  BMI: Estimated body mass index is 25.33 kg/m as calculated from the following:   Height as of this encounter: '5\' 3"'$  (1.6 m).   Weight as of this encounter: 143 lb (64.9 kg). Ideal: Ideal body weight: 52.4 kg (115 lb 8.3 oz) Adjusted ideal body weight: 57.4 kg (126 lb 8.2 oz)  Assessment   Diagnosis Status  1. Chronic low back pain (1ry area of Pain) (Bilateral) (R>L) w/o sciatica   2. Lumbar facet joint syndrome   3. Chronic hip pain (2ry area of Pain) (Bilateral) (L>R)   4. Chronic knee pain (3ry area of Pain) (Bilateral) (R>L)    Controlled Controlled Controlled   Updated Problems: No problems updated.  Plan of Care  Problem-specific:  No problem-specific Assessment & Plan notes found for this encounter.  Ms. KYIANNA GERICKE is not on any long-term medications.  Pharmacotherapy (Medications Ordered): No orders of the defined types were placed in this encounter.  Orders:  Orders Placed This Encounter  Procedures   LUMBAR FACET(MEDIAL BRANCH NERVE BLOCK) MBNB    Diagnosis: Lumbar Facet Syndrome (M47.816); Lumbosacral Facet Syndrome (M47.817); Lumbar Facet Joint Pain (M54.59) Medical Necessity Statement: 1.Severe chronic axial low back pain causing functional impairment documented by ongoing pain scale assessments. 2.Pain present for longer than 3 months (Chronic) documented to have failed noninvasive conservative therapies. 3.Absence of untreated radiculopathy. 4.There is no radiological evidence of untreated fractures, tumor, infection, or deformity.  Physical Examination Findings: Positive Kemp Maneuver: (Y)  Positive Lumbar Hyperextension-Rotation provocative test: (Y)    Standing  Status:   Future    Standing Expiration Date:   12/30/2022    Scheduling Instructions:     Procedure: Lumbar facet Medial Branch Block (MBB)     Side: Bilateral     Level: L3-4, L4-5, and L5-S1 Facets (L2, L3, L4, L5, and S1 Medial Branch)     Sedation: With Sedation.     Timeframe: ASAP    Order Specific Question:   Where will this procedure be performed?    Answer:   [facility]   Follow-up plan:   Return for (ECT): (B) L-FCT Blk #2.  Interventional Therapies  Risk Factors  Considerations:    Most pain described to be from this level down except on right.    Planned  Pending:      Under consideration:   Pending completion of evaluation   Completed:   None at this time   Completed by other providers:   By Dr. Sharlet Salina Therapeutic right L4-5 TFESI x2 (03/19/2021) by Sharlet Salina, DO [facility] PMR)  Therapeutic right L5-S1 TFESI x1 (03/19/2021) by Sharlet Salina, DO [facility] PMR)  Therapeutic right C6-7 TFESI x3 (05/19/2020) by Sharlet Salina, DO [facility] PMR)  Therapeutic right C6-7 TFESI x1 (04/30/2021) by Sharlet Salina, DO [facility] PMR)  Therapeutic bilateral IA steroid knee inj. x1 (07/30/2021) by Tamala Julian, PA ([facility])    Therapeutic  Palliative (PRN) options:   None established         Recent Visits Date Type Provider Dept  09/15/22 Procedure visit Milinda Pointer, MD [facility]  09/05/22 Office Visit Milinda Pointer, MD [facility]  08/01/22 Office Visit Milinda Pointer, MD [facility]  Showing recent visits within past 90 days and meeting all other requirements Today's Visits Date Type Provider Dept  09/29/22 Office Visit Milinda Pointer, MD [facility]  Showing today's visits and meeting all other requirements Future Appointments No visits were found meeting these conditions. Showing future appointments within next 90 days and meeting all other requirements  I discussed the assessment and  treatment plan with the patient. The patient was provided an opportunity to ask questions and all were answered. The patient agreed with the plan and demonstrated an understanding of the instructions.  Patient advised to call back or seek an in-person evaluation if the symptoms or condition worsens.  Duration of encounter: 40 minutes.  Total time on encounter, as per AMA guidelines included both the face-to-face and non-face-to-face time personally spent by the physician and/or other qualified health care professional(s) on the day of the encounter (includes time in activities that require the physician or other qualified health care professional and does not include time in activities normally performed by clinical staff). Physician's time may include the following activities when performed: Preparing to see the patient (e.g., pre-charting review of records, searching for previously ordered imaging, lab work, and nerve conduction tests) Review of prior analgesic pharmacotherapies. Reviewing PMP Interpreting ordered tests (e.g., lab work, imaging, nerve conduction tests) Performing post-procedure evaluations, including interpretation of diagnostic procedures Obtaining and/or reviewing separately obtained history Performing a medically appropriate examination and/or evaluation Counseling and educating the patient/family/caregiver Ordering medications, tests, or procedures Referring and communicating with other health care professionals (when not separately reported) Documenting clinical information in the electronic or other health record Independently interpreting results (not separately reported) and communicating results to the patient/ family/caregiver Care coordination (not separately reported)  Note by: Gaspar Cola, MD Date: 09/29/2022; Time: 2:43 PM

## 2022-09-29 NOTE — Patient Instructions (Signed)

## 2022-10-17 NOTE — Progress Notes (Unsigned)
PROVIDER NOTE: Interpretation of information contained herein should be left to medically-trained personnel. Specific patient instructions are provided elsewhere under "Patient Instructions" section of medical record. This document was created in part using STT-dictation technology, any transcriptional errors that may result from this process are unintentional.  Patient: Alyssa Vega Type: Established DOB: [birth year] MRN: [id number] PCP: Dion Body, MD  Service: Procedure DOS: 10/18/2022 Setting: Ambulatory Location: Ambulatory outpatient facility Delivery: Face-to-face Provider: Gaspar Cola, MD Specialty: Interventional Pain Management Specialty designation: 09 Location: Outpatient facility Ref. Prov.: Dion Body, MD       Interventional Therapy   Procedure: Lumbar Facet, Medial Branch Block(s) #2  Laterality: Bilateral  Level: L3, L4, L5, and S1 Medial Branch Level(s). Injecting these levels blocks the L4-5 and L5-S1 lumbar facet joints. Imaging: Fluoroscopic guidance         Anesthesia: Local anesthesia (1-2% Lidocaine) Anxiolysis: IV Versed 3.0 mg Sedation: Moderate Sedation None required. No Fentanyl administered.         DOS: 10/18/2022 Performed by: Gaspar Cola, MD  Primary Purpose: Diagnostic/Therapeutic Indications: Low back pain severe enough to impact quality of life or function. 1. Lumbar facet joint syndrome   2. Lumbar facet arthropathy (Multilevel) (Bilateral)   3. Spondylosis without myelopathy or radiculopathy, lumbosacral region   4. DJD (degenerative joint disease), lumbosacral   5. DDD (degenerative disc disease), lumbosacral   6. Chronic low back pain (1ry area of Pain) (Bilateral) (R>L) w/o sciatica    Abnormal MRI, lumbar spine (03/20/2021)    NAS-11 Pain score:   Pre-procedure: 6 /10   Post-procedure: 0-No pain/10   Note: Although the patient is coming in today for her diagnostic bilateral lumbar facet block #2, she indicated  that lately she has been experiencing a significant amount of pain in the area of the neck, especially on the right side.  This seems to be giving her headaches in the distribution of the lesser occipital nerve.  Review of the patient's cervical MRI confirms that she has significant facet disease also affecting her at the level of the cervical spine.  Today I have explained to the patient that we will work first on her lower back and then move onto the neck area rather than leaving an area just partially treated.  She understood and accepted.    Position / Prep / Materials:  Position: Prone  Prep solution: DuraPrep (Iodine Povacrylex [0.7% available iodine] and Isopropyl Alcohol, 74% w/w) Area Prepped: Posterolateral Lumbosacral Spine (Wide prep: From the lower border of the scapula down to the end of the tailbone and from flank to flank.)  Materials:  Tray: Block Needle(s):  Type: Spinal  Gauge (G): 22  Length: 3.5-in Qty: 4      Pre-op H&P Assessment:  Alyssa Vega is a 67 y.o. (year old), female patient, seen today for interventional treatment. She  has a past surgical history that includes Ovarian cyst removal; Colonoscopy with propofol (N/A, 04/20/2020); and Esophagogastroduodenoscopy (egd) with propofol (N/A, 04/20/2020). Alyssa Vega has a current medication list which includes the following prescription(s): alendronate, diclofenac, ezetimibe, glucosamine-chondroitin, multivitamin, naproxen sodium, and pantoprazole, and the following Facility-Administered Medications: fentanyl. Her primarily concern today is the Back Pain  Initial Vital Signs:  Pulse/HCG Rate: 71ECG Heart Rate: 76 Temp: 98.2 F (36.8 C) Resp: 18 BP: 127/64 SpO2: 99 %  BMI: Estimated body mass index is 25.69 kg/m as calculated from the following:   Height as of this encounter: 5\' 3"  (1.6 m).   Weight as of  this encounter: 145 lb (65.8 kg).  Risk Assessment: Allergies: Reviewed. She is allergic to duloxetine,  atorvastatin, cyclobenzaprine, ibuprofen, melatonin, meloxicam, tizanidine, and venlafaxine.  Allergy Precautions: None required Coagulopathies: Reviewed. None identified.  Blood-thinner therapy: None at this time Active Infection(s): Reviewed. None identified. [name] is afebrile  Site Confirmation: Alyssa Vega was asked to confirm the procedure and laterality before marking the site Procedure checklist: Completed Consent: Before the procedure and under the influence of no sedative(s), amnesic(s), or anxiolytics, the patient was informed of the treatment options, risks and possible complications. To fulfill our ethical and legal obligations, as recommended by the American Medical Association's Code of Ethics, I have informed the patient of my clinical impression; the nature and purpose of the treatment or procedure; the risks, benefits, and possible complications of the intervention; the alternatives, including doing nothing; the risk(s) and benefit(s) of the alternative treatment(s) or procedure(s); and the risk(s) and benefit(s) of doing nothing. The patient was provided information about the general risks and possible complications associated with the procedure. These may include, but are not limited to: failure to achieve desired goals, infection, bleeding, organ or nerve damage, allergic reactions, paralysis, and death. In addition, the patient was informed of those risks and complications associated to Spine-related procedures, such as failure to decrease pain; infection (i.e.: Meningitis, epidural or intraspinal abscess); bleeding (i.e.: epidural hematoma, subarachnoid hemorrhage, or any other type of intraspinal or peri-dural bleeding); organ or nerve damage (i.e.: Any type of peripheral nerve, nerve root, or spinal cord injury) with subsequent damage to sensory, motor, and/or autonomic systems, resulting in permanent pain, numbness, and/or weakness of one or several areas of the body; allergic  reactions; (i.e.: anaphylactic reaction); and/or death. Furthermore, the patient was informed of those risks and complications associated with the medications. These include, but are not limited to: allergic reactions (i.e.: anaphylactic or anaphylactoid reaction(s)); adrenal axis suppression; blood sugar elevation that in diabetics may result in ketoacidosis or comma; water retention that in patients with history of congestive heart failure may result in shortness of breath, pulmonary edema, and decompensation with resultant heart failure; weight gain; swelling or edema; medication-induced neural toxicity; particulate matter embolism and blood vessel occlusion with resultant organ, and/or nervous system infarction; and/or aseptic necrosis of one or more joints. Finally, the patient was informed that Medicine is not an exact science; therefore, there is also the possibility of unforeseen or unpredictable risks and/or possible complications that may result in a catastrophic outcome. The patient indicated having understood very clearly. We have given the patient no guarantees and we have made no promises. Enough time was given to the patient to ask questions, all of which were answered to the patient's satisfaction. Ms. Ore has indicated that she wanted to continue with the procedure. Attestation: I, the ordering provider, attest that I have discussed with the patient the benefits, risks, side-effects, alternatives, likelihood of achieving goals, and potential problems during recovery for the procedure that I have provided informed consent. Date  Time: 10/18/2022  8:30 AM   Pre-Procedure Preparation:  Monitoring: As per clinic protocol. Respiration, ETCO2, SpO2, BP, heart rate and rhythm monitor placed and checked for adequate function Safety Precautions: Patient was assessed for positional comfort and pressure points before starting the procedure. Time-out: I initiated and conducted the "Time-out" before  starting the procedure, as per protocol. The patient was asked to participate by confirming the accuracy of the "Time Out" information. Verification of the correct person, site, and procedure were performed and  confirmed by me, the nursing staff, and the patient. "Time-out" conducted as per Joint Commission's Universal Protocol (UP.01.01.01). Time: 0936 Start Time: 0936 hrs.  Description of Procedure:          Laterality: (see above) Targeted Levels: (see above)  Safety Precautions: Aspiration looking for blood return was conducted prior to all injections. At no point did we inject any substances, as a needle was being advanced. Before injecting, the patient was told to immediately notify me if she was experiencing any new onset of "ringing in the ears, or metallic taste in the mouth". No attempts were made at seeking any paresthesias. Safe injection practices and needle disposal techniques used. Medications properly checked for expiration dates. SDV (single dose vial) medications used. After the completion of the procedure, all disposable equipment used was discarded in the proper designated medical waste containers. Local Anesthesia: Protocol guidelines were followed. The patient was positioned over the fluoroscopy table. The area was prepped in the usual manner. The time-out was completed. The target area was identified using fluoroscopy. A 12-in long, straight, sterile hemostat was used with fluoroscopic guidance to locate the targets for each level blocked. Once located, the skin was marked with an approved surgical skin marker. Once all sites were marked, the skin (epidermis, dermis, and hypodermis), as well as deeper tissues (fat, connective tissue and muscle) were infiltrated with a small amount of a short-acting local anesthetic, loaded on a 10cc syringe with a 25G, 1.5-in  Needle. An appropriate amount of time was allowed for local anesthetics to take effect before proceeding to the next  step. Local Anesthetic: Lidocaine 2.0% The unused portion of the local anesthetic was discarded in the proper designated containers. Technical description of process:   L3 Medial Branch Nerve Block (MBB): The target area for the L3 medial branch is at the junction of the postero-lateral aspect of the superior articular process and the superior, posterior, and medial edge of the transverse process of L4. Under fluoroscopic guidance, a Quincke needle was inserted until contact was made with os over the superior postero-lateral aspect of the pedicular shadow (target area). After negative aspiration for blood, 0.5 mL of the nerve block solution was injected without difficulty or complication. The needle was removed intact. L4 Medial Branch Nerve Block (MBB): The target area for the L4 medial branch is at the junction of the postero-lateral aspect of the superior articular process and the superior, posterior, and medial edge of the transverse process of L5. Under fluoroscopic guidance, a Quincke needle was inserted until contact was made with os over the superior postero-lateral aspect of the pedicular shadow (target area). After negative aspiration for blood, 0.5 mL of the nerve block solution was injected without difficulty or complication. The needle was removed intact. L5 Medial Branch Nerve Block (MBB): The target area for the L5 medial branch is at the junction of the postero-lateral aspect of the superior articular process and the superior, posterior, and medial edge of the sacral ala. Under fluoroscopic guidance, a Quincke needle was inserted until contact was made with os over the superior postero-lateral aspect of the pedicular shadow (target area). After negative aspiration for blood, 0.5 mL of the nerve block solution was injected without difficulty or complication. The needle was removed intact. S1 Medial Branch Nerve Block (MBB): The target area for the S1 medial branch is at the posterior and  inferior 6 o'clock position of the L5-S1 facet joint. Under fluoroscopic guidance, the Quincke needle inserted for the L5 MBB  was redirected until contact was made with os over the inferior and postero aspect of the sacrum, at the 6 o' clock position under the L5-S1 facet joint (Target area). After negative aspiration for blood, 0.5 mL of the nerve block solution was injected without difficulty or complication. The needle was removed intact.  Once the entire procedure was completed, the treated area was cleaned, making sure to leave some of the prepping solution back to take advantage of its long term bactericidal properties.         Illustration of the posterior view of the lumbar spine and the posterior neural structures. Laminae of L2 through S1 are labeled. DPRL5, dorsal primary ramus of L5; DPRS1, dorsal primary ramus of S1; DPR3, dorsal primary ramus of L3; FJ, facet (zygapophyseal) joint L3-L4; I, inferior articular process of L4; LB1, lateral branch of dorsal primary ramus of L1; IAB, inferior articular branches from L3 medial branch (supplies L4-L5 facet joint); IBP, intermediate branch plexus; MB3, medial branch of dorsal primary ramus of L3; NR3, third lumbar nerve root; S, superior articular process of L5; SAB, superior articular branches from L4 (supplies L4-5 facet joint also); TP3, transverse process of L3.   Facet Joint Innervation (* possible contribution)  L1-2 T12, L1 (L2*)  Medial Branch  L2-3 L1, L2 (L3*)         "          "  L3-4 L2, L3 (L4*)         "          "  L4-5 L3, L4 (L5*)         "          "  L5-S1 L4, L5, S1          "          "    Vitals:   10/18/22 0948 10/18/22 0955 10/18/22 1005 10/18/22 1015  BP: 131/72 124/68 (!) 135/90 132/74  Pulse: 66     Resp: 15 16 15 17   Temp:  98.1 F (36.7 C)    TempSrc:      SpO2: 99% 100% 100% 100%  Weight:      Height:         End Time: 0947 hrs.  Imaging Guidance (Spinal):          Type of Imaging Technique:  Fluoroscopy Guidance (Spinal) Indication(s): Assistance in needle guidance and placement for procedures requiring needle placement in or near specific anatomical locations not easily accessible without such assistance. Exposure Time: Please see nurses notes. Contrast: None used. Fluoroscopic Guidance: I was personally present during the use of fluoroscopy. "Tunnel Vision Technique" used to obtain the best possible view of the target area. Parallax error corrected before commencing the procedure. "Direction-depth-direction" technique used to introduce the needle under continuous pulsed fluoroscopy. Once target was reached, antero-posterior, oblique, and lateral fluoroscopic projection used confirm needle placement in all planes. Images permanently stored in EMR. Interpretation: No contrast injected. I personally interpreted the imaging intraoperatively. Adequate needle placement confirmed in multiple planes. Permanent images saved into the patient's record.  Post-operative Assessment:  Post-procedure Vital Signs:  Pulse/HCG Rate: 6670 Temp: 98.1 F (36.7 C) Resp: 17 BP: 132/74 SpO2: 100 %  EBL: None  Complications: No immediate post-treatment complications observed by team, or reported by patient.  Note: The patient tolerated the entire procedure well. A repeat set of vitals were taken after the procedure and the patient was kept under observation following institutional  policy, for this type of procedure. Post-procedural neurological assessment was performed, showing return to baseline, prior to discharge. The patient was provided with post-procedure discharge instructions, including a section on how to identify potential problems. Should any problems arise concerning this procedure, the patient was given instructions to immediately contact us, at any time, without hesitation. In any case, we plan to contact the patient by telephone for a follow-up status report regarding this interventional  procedure.  Comments:  No additional relevant information.  Plan of Care (POC)  Orders:  Orders Placed This Encounter  Procedures   LUMBAR FACET(MEDIAL BRANCH NERVE BLOCK) MBNB    Scheduling Instructions:     Procedure: Lumbar facet block (AKA.: Lumbosacral medial branch nerve block)     Side: Bilateral     Left side Level: L3, L4, L5, and S1 Medial Branch Level(s). Injecting these levels blocks the L4-5 and L5-S1 lumbar facet joints.     Right side Level: L2, L3, L4, L5, and S1 Medial Branch Level(s). Injecting these levels blocks the L3-4, L4-5, and L5-S1 lumbar facet joints     Sedation: Patient's choice.     Timeframe: Today    Order Specific Question:   Where will this procedure be performed?    Answer:   [facility]   DG PAIN [facility] C-ARM 1-60 MIN NO REPORT    Intraoperative interpretation by procedural physician at [place].    Standing Status:   Standing    Number of Occurrences:   1    Order Specific Question:   Reason for exam:    Answer:   Assistance in needle guidance and placement for procedures requiring needle placement in or near specific anatomical locations not easily accessible without such assistance.   Informed Consent Details: Physician/Practitioner Attestation; Transcribe to consent form and obtain patient signature    Nursing Order: Transcribe to consent form and obtain patient signature. Note: Always confirm laterality of pain with Ms. Val, before procedure.    Order Specific Question:   Physician/Practitioner attestation of informed consent for procedure/surgical case    Answer:   I, the physician/practitioner, attest that I have discussed with the patient the benefits, risks, side effects, alternatives, likelihood of achieving goals and potential problems during recovery for the procedure that I have provided informed consent.    Order Specific Question:   Procedure    Answer:   Lumbar Facet Block  under fluoroscopic guidance    Order  Specific Question:   Physician/Practitioner performing the procedure    Answer:   Arianne Klinge A. Dossie Arbour MD    Order Specific Question:   Indication/Reason    Answer:   Low Back Pain, with our without leg pain, due to Facet Joint Arthralgia (Joint Pain) Spondylosis (Arthritis of the Spine), without myelopathy or radiculopathy (Nerve Damage).   Provide equipment / supplies at bedside    Procedure tray: "Block Tray" (Disposable  single use) Skin infiltration needle: Regular 1.5-in, 25-G, (x1) Block Needle type: Spinal Amount/quantity: 5 Size: Regular (3.5-inch) Gauge: 22G    Standing Status:   Standing    Number of Occurrences:   1    Order Specific Question:   Specify    Answer:   Block Tray   Chronic Opioid Analgesic:  None MME/day: 0 mg/day   Medications ordered for procedure: Meds ordered this encounter  Medications   lidocaine (XYLOCAINE) 2 % (with pres) injection 400 mg   pentafluoroprop-tetrafluoroeth (GEBAUERS) aerosol   lactated ringers infusion   midazolam (VERSED)  5 MG/5ML injection 0.5-2 mg    Make sure Flumazenil is available in the pyxis when using this medication. If oversedation occurs, administer 0.2 mg IV over 15 sec. If after 45 sec no response, administer 0.2 mg again over 1 min; may repeat at 1 min intervals; not to exceed 4 doses (1 mg)   fentaNYL (SUBLIMAZE) injection 25-50 mcg    Make sure Narcan is available in the pyxis when using this medication. In the event of respiratory depression (RR< 8/min): Titrate NARCAN (naloxone) in increments of 0.1 to 0.2 mg IV at 2-3 minute intervals, until desired degree of reversal.   ropivacaine (PF) 2 mg/mL (0.2%) (NAROPIN) injection 18 mL   triamcinolone acetonide (KENALOG-40) injection 80 mg   Medications administered: We administered lidocaine, pentafluoroprop-tetrafluoroeth, lactated ringers, midazolam, ropivacaine (PF) 2 mg/mL (0.2%), and triamcinolone acetonide.  See the medical record for exact dosing, route, and time  of administration.  Follow-up plan:   Return in about 2 weeks (around 11/01/2022) for Proc-day (T,Th), (Face2F), (PPE).       Interventional Therapies  Risk Factors  Considerations:    Most pain described to be from this level down except on right.    Planned  Pending:      Under consideration:   Therapeutic bilateral lumbar facet (L4-5, L5-S1) RFA #1    Completed:   Diagnostic bilateral (L4-5, L5-S1) Lumbar Facet (L3-S1) MBB x2 (10/18/2022) (1st: 100/100/50 x 3 day) (2nd: ...   Completed by other providers:   By Dr. Sharlet Salina Therapeutic right L4-5 TFESI x2 (03/19/2021) by Sharlet Salina, DO [facility])  Therapeutic right L5-S1 TFESI x1 (03/19/2021) by Sharlet Salina, DO [facility] PMR)  Therapeutic right C6-7 TFESI x3 (05/19/2020) by Sharlet Salina, DO [facility] PMR)  Therapeutic right C6-7 TFESI x1 (04/30/2021) by Sharlet Salina, DO [facility] PMR)  Therapeutic bilateral IA steroid knee inj. x1 (07/30/2021) by Tamala Julian, PA ([facility])    Therapeutic  Palliative (PRN) options:   None established          Recent Visits Date Type Provider Dept  09/29/22 Office Visit Milinda Pointer, MD [facility]  09/15/22 Procedure visit Milinda Pointer, MD [facility]  09/05/22 Office Visit Milinda Pointer, MD [facility]  08/01/22 Office Visit Milinda Pointer, MD [facility]  Showing recent visits within past 90 days and meeting all other requirements Today's Visits Date Type Provider Dept  10/18/22 Procedure visit Milinda Pointer, MD [facility]  Showing today's visits and meeting all other requirements Future Appointments Date Type Provider Dept  11/01/22 Appointment Milinda Pointer, MD [facility]  Showing future appointments within next 90 days and meeting all other requirements  Disposition: Discharge home  Discharge (Date  Time): 10/18/2022; 1016 hrs.   Primary Care Physician: Dion Body,  MD Location: [facility] Note by: Gaspar Cola, MD (TTS technology used. I apologize for any typographical errors that were not detected and corrected.) Date: 10/18/2022; Time: 11:51 AM  Disclaimer:  Medicine is not an Chief Strategy Officer. The only guarantee in medicine is that nothing is guaranteed. It is important to note that the decision to proceed with this intervention was based on the information collected from the patient. The Data and conclusions were drawn from the patient's questionnaire, the interview, and the physical examination. Because the information was provided in large part by the patient, it cannot be guaranteed that it has not been purposely or unconsciously manipulated. Every effort has been made to obtain as  much relevant data as possible for this evaluation. It is important to note that the conclusions that lead to this procedure are derived in large part from the available data. Always take into account that the treatment will also be dependent on availability of resources and existing treatment guidelines, considered by other Pain Management Practitioners as being common knowledge and practice, at the time of the intervention. For Medico-Legal purposes, it is also important to point out that variation in procedural techniques and pharmacological choices are the acceptable norm. The indications, contraindications, technique, and results of the above procedure should only be interpreted and judged by a Board-Certified Interventional Pain Specialist with extensive familiarity and expertise in the same exact procedure and technique.

## 2022-10-18 ENCOUNTER — Ambulatory Visit: Payer: PPO | Attending: Pain Medicine | Admitting: Pain Medicine

## 2022-10-18 ENCOUNTER — Ambulatory Visit
Admission: RE | Admit: 2022-10-18 | Discharge: 2022-10-18 | Disposition: A | Discharge status: Home / Self Care | Payer: PPO | Source: Ambulatory Visit | Attending: Pain Medicine | Admitting: Pain Medicine

## 2022-10-18 ENCOUNTER — Encounter: Payer: Self-pay | Admitting: Pain Medicine

## 2022-10-18 VITALS — BP 132/74 | HR 66 | Temp 98.1°F | Resp 17 | Ht 63.0 in | Wt 145.0 lb

## 2022-10-18 DIAGNOSIS — G8929 Other chronic pain: Secondary | ICD-10-CM | POA: Insufficient documentation

## 2022-10-18 DIAGNOSIS — R937 Abnormal findings on diagnostic imaging of other parts of musculoskeletal system: Secondary | ICD-10-CM

## 2022-10-18 DIAGNOSIS — M545 Low back pain, unspecified: Secondary | ICD-10-CM | POA: Diagnosis not present

## 2022-10-18 DIAGNOSIS — M5137 Other intervertebral disc degeneration, lumbosacral region: Secondary | ICD-10-CM | POA: Diagnosis not present

## 2022-10-18 DIAGNOSIS — M47816 Spondylosis without myelopathy or radiculopathy, lumbar region: Secondary | ICD-10-CM

## 2022-10-18 DIAGNOSIS — M47817 Spondylosis without myelopathy or radiculopathy, lumbosacral region: Secondary | ICD-10-CM | POA: Insufficient documentation

## 2022-10-18 MED ORDER — LIDOCAINE HCL 2 % IJ SOLN
20.0000 mL | Freq: Once | INTRAMUSCULAR | Status: AC
  Administered 2022-10-18: 400 mg

## 2022-10-18 MED ORDER — LACTATED RINGERS IV SOLN: Freq: Once | INTRAVENOUS | Status: AC

## 2022-10-18 MED ORDER — PENTAFLUOROPROP-TETRAFLUOROETH EX AERO
INHALATION_SPRAY | Freq: Once | CUTANEOUS | Status: AC
  Administered 2022-10-18: 30 via TOPICAL
  Filled 2022-10-18: qty 116

## 2022-10-18 MED ORDER — ROPIVACAINE HCL 2 MG/ML IJ SOLN
18.0000 mL | Freq: Once | INTRAMUSCULAR | Status: AC
  Administered 2022-10-18: 18 mL via PERINEURAL

## 2022-10-18 MED ORDER — FENTANYL CITRATE (PF) 100 MCG/2ML IJ SOLN
INTRAMUSCULAR | Status: AC
  Filled 2022-10-18: qty 2

## 2022-10-18 MED ORDER — FENTANYL CITRATE (PF) 100 MCG/2ML IJ SOLN: 25.0000 ug | INTRAMUSCULAR | Status: DC | PRN

## 2022-10-18 MED ORDER — TRIAMCINOLONE ACETONIDE 40 MG/ML IJ SUSP
INTRAMUSCULAR | Status: AC
  Filled 2022-10-18: qty 2

## 2022-10-18 MED ORDER — TRIAMCINOLONE ACETONIDE 40 MG/ML IJ SUSP
80.0000 mg | Freq: Once | INTRAMUSCULAR | Status: AC
  Administered 2022-10-18: 40 mg

## 2022-10-18 MED ORDER — LIDOCAINE HCL 2 % IJ SOLN
INTRAMUSCULAR | Status: AC
  Filled 2022-10-18: qty 20

## 2022-10-18 MED ORDER — MIDAZOLAM HCL 5 MG/5ML IJ SOLN
0.5000 mg | Freq: Once | INTRAMUSCULAR | Status: AC
  Administered 2022-10-18: 2 mg via INTRAVENOUS
  Administered 2022-10-18: 1 mg via INTRAVENOUS

## 2022-10-18 MED ORDER — MIDAZOLAM HCL 5 MG/5ML IJ SOLN
INTRAMUSCULAR | Status: AC
  Filled 2022-10-18: qty 5

## 2022-10-18 MED ORDER — ROPIVACAINE HCL 2 MG/ML IJ SOLN
INTRAMUSCULAR | Status: AC
  Filled 2022-10-18: qty 20

## 2022-10-18 NOTE — Patient Instructions (Signed)

## 2022-10-18 NOTE — Progress Notes (Signed)
Safety precautions to be maintained throughout the outpatient stay will include: orient to surroundings, keep bed in low position, maintain call bell within reach at all times, provide assistance with transfer out of bed and ambulation.  

## 2022-10-19 ENCOUNTER — Telehealth: Payer: Self-pay | Admitting: *Deleted

## 2022-10-19 NOTE — Telephone Encounter (Signed)
No problems post procedure. 

## 2022-10-30 ENCOUNTER — Emergency Department: Payer: PPO

## 2022-10-30 ENCOUNTER — Emergency Department
Admission: EM | Admit: 2022-10-30 | Discharge: 2022-10-30 | Disposition: A | Discharge status: Home / Self Care | Payer: PPO | Attending: Emergency Medicine | Admitting: Emergency Medicine

## 2022-10-30 DIAGNOSIS — R1032 Left lower quadrant pain: Secondary | ICD-10-CM | POA: Diagnosis not present

## 2022-10-30 DIAGNOSIS — I700 Atherosclerosis of aorta: Secondary | ICD-10-CM | POA: Diagnosis not present

## 2022-10-30 DIAGNOSIS — R197 Diarrhea, unspecified: Secondary | ICD-10-CM | POA: Diagnosis not present

## 2022-10-30 LAB — URINALYSIS, ROUTINE W REFLEX MICROSCOPIC
Bilirubin Urine: NEGATIVE
Glucose, UA: NEGATIVE mg/dL
Hgb urine dipstick: NEGATIVE
Ketones, ur: NEGATIVE mg/dL
Leukocytes,Ua: NEGATIVE
Nitrite: NEGATIVE
Protein, ur: NEGATIVE mg/dL
Specific Gravity, Urine: 1.016 (ref 1.005–1.030)
pH: 6 (ref 5.0–8.0)

## 2022-10-30 LAB — COMPREHENSIVE METABOLIC PANEL
ALT: 32 U/L (ref 0–44)
AST: 35 U/L (ref 15–41)
Albumin: 3.7 g/dL (ref 3.5–5.0)
Alkaline Phosphatase: 56 U/L (ref 38–126)
Anion gap: 7 (ref 5–15)
BUN: 8 mg/dL (ref 8–23)
CO2: 25 mmol/L (ref 22–32)
Calcium: 8.3 mg/dL — ABNORMAL LOW (ref 8.9–10.3)
Chloride: 103 mmol/L (ref 98–111)
Creatinine, Ser: 0.58 mg/dL (ref 0.44–1.00)
GFR, Estimated: >60 mL/min (ref >60)
Glucose, Bld: 89 mg/dL (ref 70–99)
Potassium: 3.6 mmol/L (ref 3.5–5.1)
Sodium: 135 mmol/L (ref 135–145)
Total Bilirubin: 0.5 mg/dL (ref 0.3–1.2)
Total Protein: 6.4 g/dL — ABNORMAL LOW (ref 6.5–8.1)

## 2022-10-30 LAB — LIPASE, BLOOD: Lipase: 33 U/L (ref 11–51)

## 2022-10-30 LAB — CBC
HCT: 46.3 % — ABNORMAL HIGH (ref 36.0–46.0)
Hemoglobin: 15.5 g/dL — ABNORMAL HIGH (ref 12.0–15.0)
MCH: 30.2 pg (ref 26.0–34.0)
MCHC: 33.5 g/dL (ref 30.0–36.0)
MCV: 90.1 fL (ref 80.0–100.0)
Platelets: 227 10*3/uL (ref 150–400)
RBC: 5.14 MIL/uL — ABNORMAL HIGH (ref 3.87–5.11)
RDW: 14.7 % (ref 11.5–15.5)
WBC: 8.6 10*3/uL (ref 4.0–10.5)
nRBC: 0 % (ref 0.0–0.2)

## 2022-10-30 MED ORDER — IOHEXOL 300 MG/ML  SOLN
100.0000 mL | Freq: Once | INTRAMUSCULAR | Status: AC | PRN
  Administered 2022-10-30: 100 mL via INTRAVENOUS

## 2022-10-30 NOTE — ED Notes (Signed)
Pt states via an interpreter that she has stomach pain and pain on the left side that was really bad yesterday. Pain started 2 weeks ago and her stools have not been normal, this morning it was diarrhea. States she has hx of diverticulitis. Pt denies blood in stool, pain with urination, fever, cough, SOB. Pt has been experiencing a little nausea today and yesterday. Pt denies any stomach surgeries.

## 2022-10-30 NOTE — ED Triage Notes (Signed)
Pt to ED via POV from home. Pt reports left lower abdominal pain x2wks with intermittent diarrhea. Pt reports hx of diverticulitis.

## 2022-10-30 NOTE — ED Provider Notes (Signed)
[facility] Provider Note    Event Date/Time   First MD Initiated Contact with Patient 10/30/22 1113     (approximate)   History   Chief Complaint Abdominal Pain   HPI  Alyshia A Dibiase is a 67 y.o. female with past medical history of chronic pain syndrome, anxiety, GERD, diverticulitis who presents to the ED complaining of abdominal pain.  History obtained with assistance of in person interpreter given patient is Spanish-speaking only.  Patient reports that she has been dealing with about 3 weeks of increasing pain in the left lower quadrant of her abdomen.  Pain seemed to get acutely worse last night and has been severe over the course of the day today and associated with some diarrhea.  She reports some nausea but has not had any vomiting and has not noticed any blood in her stool.  She denies any fevers, dysuria, hematuria, or flank pain.  She reports a history of diverticulitis with similar symptoms in the past.     Physical Exam   Triage Vital Signs: ED Triage Vitals [10/30/22 1112]  Enc Vitals Group     BP 135/61     Pulse Rate 81     Resp 18     Temp 98.2 F (36.8 C)     Temp Source Oral     SpO2 100 %     Weight      Height      Head Circumference      Peak Flow      Pain Score 4     Pain Loc      Pain Edu?      Excl. in GC?     Most recent vital signs: Vitals:   10/30/22 1147 10/30/22 1200  BP: 116/60 114/69  Pulse: 86 81  Resp: 13 17  Temp:    SpO2: 100% 99%    Constitutional: Alert and oriented. Eyes: Conjunctivae are normal. Head: Atraumatic. Nose: No congestion/rhinnorhea. Mouth/Throat: Mucous membranes are moist.  Cardiovascular: Normal rate, regular rhythm. Grossly normal heart sounds.  2+ radial pulses bilaterally. Respiratory: Normal respiratory effort.  No retractions. Lungs CTAB. Gastrointestinal: Soft and tender to palpation left lower quadrant with no rebound or guarding. No distention. Musculoskeletal: No lower  extremity tenderness nor edema.  Neurologic:  Normal speech and language. No gross focal neurologic deficits are appreciated.    ED Results / Procedures / Treatments   Labs (all labs ordered are listed, but only abnormal results are displayed) Labs Reviewed  COMPREHENSIVE METABOLIC PANEL - Abnormal; Notable for the following components:      Result Value   Calcium 8.3 (*)    Total Protein 6.4 (*)    All other components within normal limits  CBC - Abnormal; Notable for the following components:   RBC 5.14 (*)    Hemoglobin 15.5 (*)    HCT 46.3 (*)    All other components within normal limits  URINALYSIS, ROUTINE W REFLEX MICROSCOPIC - Abnormal; Notable for the following components:   Color, Urine YELLOW (*)    APPearance CLEAR (*)    All other components within normal limits  LIPASE, BLOOD    RADIOLOGY CT abdomen/pelvis reviewed and interpreted by me with no inflammatory changes, focal fluid collections, or dilated bowel loops.  PROCEDURES:  Critical Care performed: No  Procedures   MEDICATIONS ORDERED IN ED: Medications  iohexol (OMNIPAQUE) 300 MG/ML solution 100 mL (100 mLs Intravenous Contrast Given 10/30/22 1227)  IMPRESSION / MDM / ASSESSMENT AND PLAN / ED COURSE  I reviewed the triage vital signs and the nursing notes.                              67 y.o. female with past medical history of chronic pain syndrome, anxiety, diverticulitis, and GERD who presents to the ED with 2 weeks of increasing pain in the left lower quadrant, acutely worse last night and associated with diarrhea.  Patient's presentation is most consistent with acute presentation with potential threat to life or bodily function.  Differential diagnosis includes, but is not limited to, diverticulitis, abscess, perforation, kidney stone, UTI, anemia, electrolyte abnormality, AKI.  Patient well-appearing and in no acute distress, vital signs are unremarkable.  Pain is reproducible with  palpation of the left lower quadrant and we will further assess with CT imaging for diverticulitis and any potential complication.  Labs and urinalysis are pending at this time, patient declines pain or nausea medication for now.  CT imaging is unremarkable, no evidence of diverticulitis or other acute process to explain patient's pain.  Labs are also reassuring with no significant anemia, leukocytosis, electrolyte abnormality, or AKI.  LFTs and lipase are unremarkable, urinalysis shows no signs of infection.  On reassessment, patient's primary concern is that she is having thin and loose bowel movements, is concerned that there is a blockage.  I reassured her that CT scan shows no signs of blockage or infection and she is appropriate for outpatient management.  She was counseled on taking a fiber supplement as well as loperamide as needed.  She was counseled to return to the ED for new or worsening symptoms.  Patient agrees with plan.      FINAL CLINICAL IMPRESSION(S) / ED DIAGNOSES   Final diagnoses:  Left lower quadrant abdominal pain  Diarrhea, unspecified type     Rx / DC Orders   ED Discharge Orders     None        Note:  This document was prepared using Dragon voice recognition software and may include unintentional dictation errors.   Chesley Noon, MD 10/30/22 1318

## 2022-10-30 NOTE — ED Notes (Signed)
Pt to CT

## 2022-11-01 ENCOUNTER — Encounter: Payer: Self-pay | Admitting: Pain Medicine

## 2022-11-01 ENCOUNTER — Ambulatory Visit: Payer: PPO | Attending: Pain Medicine | Admitting: Pain Medicine

## 2022-11-01 VITALS — BP 133/73 | HR 79 | Temp 97.9°F | Resp 16 | Ht 63.0 in | Wt 145.0 lb

## 2022-11-01 DIAGNOSIS — M47817 Spondylosis without myelopathy or radiculopathy, lumbosacral region: Secondary | ICD-10-CM | POA: Insufficient documentation

## 2022-11-01 DIAGNOSIS — G8929 Other chronic pain: Secondary | ICD-10-CM | POA: Diagnosis not present

## 2022-11-01 DIAGNOSIS — M47816 Spondylosis without myelopathy or radiculopathy, lumbar region: Secondary | ICD-10-CM | POA: Diagnosis not present

## 2022-11-01 DIAGNOSIS — M545 Low back pain, unspecified: Secondary | ICD-10-CM | POA: Diagnosis not present

## 2022-11-01 DIAGNOSIS — M5137 Other intervertebral disc degeneration, lumbosacral region: Secondary | ICD-10-CM | POA: Diagnosis not present

## 2022-11-01 DIAGNOSIS — R197 Diarrhea, unspecified: Secondary | ICD-10-CM | POA: Insufficient documentation

## 2022-11-01 DIAGNOSIS — M25552 Pain in left hip: Secondary | ICD-10-CM | POA: Diagnosis not present

## 2022-11-01 DIAGNOSIS — M25551 Pain in right hip: Secondary | ICD-10-CM | POA: Diagnosis not present

## 2022-11-01 NOTE — Patient Instructions (Signed)

## 2022-11-01 NOTE — Progress Notes (Signed)
PROVIDER NOTE: Information contained herein reflects review and annotations entered in association with encounter. Interpretation of such information and data should be left to medically-trained personnel. Information provided to patient can be located elsewhere in the medical record under "Patient Instructions". Document created using STT-dictation technology, any transcriptional errors that may result from process are unintentional.    Patient: Alyssa Vega  Service Category: E/M  Provider: Oswaldo Done, MD  DOB: February 20, 1956  DOS: 11/01/2022  Referring Provider: Marisue Ivan, MD  MRN: [id number]  Specialty: Interventional Pain Management  PCP: Marisue Ivan, MD  Type: Established Patient  Setting: Ambulatory outpatient    Location: Office  Delivery: Face-to-face     HPI  [name], a 67 y.o. 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 [name] primary complain today is Back Pain  Pertinent problems: [name] has Abdominal pain, generalized; Lower abdominal pain; Cervical radiculopathy; Chronic daily headache; LLQ pain; Chronic pain syndrome; Chronic low back pain (1ry area of Pain) (Bilateral) (R>L) w/o sciatica; Chronic hip pain (2ry area of Pain) (Bilateral) (L>R); Chronic knee pain (3ry area of Pain) (Bilateral) (R>L); Chronic neck and back pain (4th area of Pain) (Bilateral) (L>R); Chronic upper back pain (5th area of Pain) (Bilateral); Chronic shoulder pain (6th area of Pain) (Bilateral) (L>R); Cervicogenic headache (7th area of Pain) (Bilateral) (R>L); Cervico-occipital neuralgia (Right); Chronic feet pain (8th area of Pain) (Bilateral) (R>L); Abnormal MRI, lumbar spine (03/20/2021); Abnormal MRI, cervical spine (03/20/2021); Cellulitis of forearm (Left); Traumatic hematoma of left forearm; Lumbar facet joint syndrome; Lumbar facet arthropathy (Multilevel) (Bilateral); Spondylosis without myelopathy or radiculopathy, cervical  region; DDD (degenerative disc disease), cervical; Cervical foraminal stenosis (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-7); Cervical facet arthropathy (Multilevel) (Bilateral); Cervical facet syndrome; Grade 1 Retrolisthesis of C6/C7; Cervical facet hypertrophy (C6-7); Spondylosis without myelopathy or radiculopathy, lumbosacral region; DDD (degenerative disc disease), lumbosacral; and DJD (degenerative joint disease), lumbosacral on their pertinent problem list. Pain Assessment: Severity of Chronic pain is reported as a 8 /10. Location: Back Lower, Right, Left/pain radiaities down both hips bilateral and into down legs bilateral into feet. Onset: More than a month ago. Quality: Constant, Shooting, Aching, Sharp. Timing: Constant. Modifying factor(s): Tylenol and rest. Vitals:  height is 5\' 3"  (1.6 m) and weight is 145 lb (65.8 kg). Her temporal temperature is 97.9 F (36.6 C). Her blood pressure is 133/73 and her pulse is 79. Her respiration is 16 and oxygen saturation is 98%.  BMI: Estimated body mass index is 25.69 kg/m as calculated from the following:   Height as of this encounter: 5\' 3"  (1.6 m).   Weight as of this encounter: 145 lb (65.8 kg). Last encounter: 09/29/2022. Last procedure: 10/18/2022.  Reason for encounter: post-procedure evaluation and assessment.  Based on the results from the 2 diagnostic lumbar facet blocks, I believe the patient to be an excellent candidate for radiofrequency ablation of the lumbar facets.  Will start with the right side and then move onto the left side 2 weeks later.  Statement of Medical Necessity:  [name] continues to experienced debilitating chronic nerve-associated pain from the Lumbosacral Facet Syndrome (Spondylosis without myelopathy or radiculopathy, lumbosacral region [M47.817]).  Indications: Pain severe enough to impact quality of life and/or function. In addition to not having improved with more conservative therapies.  Duration: This pain has been diagnosed as  "Chronic", defined as persistent pain of longer than three (3) months in duration.  Effects: Pain has been reported to  be severe enough to impact quality of life and/or function.  Conservative care: [name] indicated having tried and either failed tolerate, failed to respond, or was unable to get enough benefit from other more conservative therapies including, but not limited to: 1. Over-the-counter oral analgesic medications (i.e.: ibuprofen, naproxen, etc.) 2. Anti-inflammatory medications 3. Muscle relaxants 4. Membrane stabilizers 5. Opioids  Physical therapy (PT): [name] indicated having tried and failed physical therapy options such as supervised physical therapy, chiropractic manipulations, and/or home exercise program (HEP), and modalities (Heat, ice, etc.).  Interventional therapies: Nerve blocks and/or surgery have failed to provide any significant long-term benefit.  Surgical care: Not indicated.  Physical exam: Has been consistent with Lumbosacral Facet Syndrome.  Diagnostic imaging: Lumbosacral Facet Arthropathy.                Diagnostic interventional therapies: [name] has attained greater than 50% reduction in pain from at least two (2) diagnostic medial branch blocks conducted in separate occasions.   Other conditions: Thru diagnostic imaging and diagnostic interventional therapies we have ruled out other conditions such as infection, tumors, fractures, and/or cord compression.   For the above listed reason, I believe, as the examining and treating physician, that it is medically necessary to proceed with Non-Pulsed Radiofrequency Ablation for the purpose of attempting to prolong the duration of the benefits seen with the diagnostic injections.  At this point, after having personally evaluated this patient and being the treating attending physician, it is my professional opinion, as a board-certified pain management specialist, that it is medically necessary for  this patient to undergo the treatments that I am ordering.  Any attempt at denying these treatments, by a health care insurance entity, represents a case of "practicing medicine without a license", which is currently illegal in the state of [place].  Furthermore, if this company attempts to use a Educational psychologist" to deny such treatment without having performed a face-to-face evaluation, represents an act of gross negligence and malpractice.   Post-procedure evaluation   Procedure: Lumbar Facet, Medial Branch Block(s) #2  Laterality: Bilateral  Level: L3, L4, L5, and S1 Medial Branch Level(s). Injecting these levels blocks the L4-5 and L5-S1 lumbar facet joints. Imaging: Fluoroscopic guidance         Anesthesia: Local anesthesia (1-2% Lidocaine) Anxiolysis: IV Versed 3.0 mg Sedation: Moderate Sedation None required. No Fentanyl administered.         DOS: 10/18/2022 Performed by: Oswaldo Done, MD  Primary Purpose: Diagnostic/Therapeutic Indications: Low back pain severe enough to impact quality of life or function. 1. Lumbar facet joint syndrome   2. Lumbar facet arthropathy (Multilevel) (Bilateral)   3. Spondylosis without myelopathy or radiculopathy, lumbosacral region   4. DJD (degenerative joint disease), lumbosacral   5. DDD (degenerative disc disease), lumbosacral   6. Chronic low back pain (1ry area of Pain) (Bilateral) (R>L) w/o sciatica    Abnormal MRI, lumbar spine (03/20/2021)    NAS-11 Pain score:   Pre-procedure: 6 /10   Post-procedure: 0-No pain/10   Note: Although the patient is coming in today for her diagnostic bilateral lumbar facet block #2, she indicated that lately she has been experiencing a significant amount of pain in the area of the neck, especially on the right side.  This seems to be giving her headaches in the distribution of the lesser occipital nerve.  Review of the patient's cervical MRI confirms that she has significant facet disease also  affecting her at the level of  the cervical spine.  Today I have explained to the patient that we will work first on her lower back and then move onto the neck area rather than leaving an area just partially treated.  She understood and accepted.     Effectiveness:  Initial hour after procedure: 100 %. Subsequent 4-6 hours post-procedure: 100 %. Analgesia past initial 6 hours: 100 % (100% relief on week 1; 50% relief on week 2; 0% relief now). Ongoing improvement:  Analgesic: The patient indicates having attained 100% relief of the pain x1 week.  Unfortunately the pain continues to return.  The patient refers that during that 1 week her low back pain and lower extremity referred pain completely went away. Function: Back to baseline ROM: Back to baseline  Pharmacotherapy Assessment  Analgesic: None MME/day: 0 mg/day   Monitoring: [place] PMP: PDMP reviewed during this encounter.       Pharmacotherapy: No side-effects or adverse reactions reported. Compliance: No problems identified. Effectiveness: Clinically acceptable.  Earlyne Iba, RN  11/01/2022  2:36 PM  Sign when Signing Visit Safety precautions to be maintained throughout the outpatient stay will include: orient to surroundings, keep bed in low position, maintain call bell within reach at all times, provide assistance with transfer out of bed and ambulation.     No results found for: "CBDTHCR" No results found for: "D8THCCBX" No results found for: "D9THCCBX"  UDS:  Summary  Date Value Ref Range Status  08/01/2022 Note  Final    Comment:    ==================================================================== Compliance Drug Analysis, Ur ==================================================================== Test                             Result       Flag       Units  Drug Absent but Declared for Prescription Verification   Diclofenac                     Not Detected UNEXPECTED    Diclofenac, as indicated in the declared  medication list, is not    always detected even when used as directed.    Naproxen                       Not Detected UNEXPECTED ==================================================================== Test                      Result    Flag   Units      Ref Range   Creatinine              59               mg/dL      >=52 ==================================================================== Declared Medications:  The flagging and interpretation on this report are based on the  following declared medications.  Unexpected results may arise from  inaccuracies in the declared medications.   **Note: The testing scope of this panel includes these medications:   Naproxen (Aleve)   **Note: The testing scope of this panel does not include small to  moderate amounts of these reported medications:   Diclofenac (Voltaren)   **Note: The testing scope of this panel does not include the  following reported medications:   Alendronate (Fosamax)  Chondroitin  Ezetimibe (Zetia)  Glucosamine  Multivitamin  Pantoprazole (Protonix) ==================================================================== For clinical consultation, please call [phone]. ====================================================================       ROS  Constitutional: Denies any fever or  chills Gastrointestinal: No reported hemesis, hematochezia, vomiting, or acute GI distress Musculoskeletal: Denies any acute onset joint swelling, redness, loss of ROM, or weakness Neurological: No reported episodes of acute onset apraxia, aphasia, dysarthria, agnosia, amnesia, paralysis, loss of coordination, or loss of consciousness  Medication Review  alendronate, diclofenac, ezetimibe, glucosamine-chondroitin, multivitamin, naproxen sodium, and pantoprazole  History Review  Allergy: [name] is allergic to duloxetine, atorvastatin, cyclobenzaprine, ibuprofen, melatonin, meloxicam, tizanidine, and venlafaxine. Drug: [name]   reports no history of drug use. Alcohol:  reports no history of alcohol use. Tobacco:  reports that she has never smoked. She has never used smokeless tobacco. Social: [name]  reports that she has never smoked. She has never used smokeless tobacco. She reports that she does not drink alcohol and does not use drugs. Medical:  has a past medical history of Arthritis. Surgical: [name]  has a past surgical history that includes Ovarian cyst removal; Colonoscopy with propofol (N/A, 04/20/2020); and Esophagogastroduodenoscopy (egd) with propofol (N/A, 04/20/2020). Family: family history is not on file.  Laboratory Chemistry Profile   Renal Lab Results  Component Value Date   BUN 8 10/30/2022   CREATININE 0.58 10/30/2022   GFRAA >60 04/05/2020   GFRNONAA >60 10/30/2022    Hepatic Lab Results  Component Value Date   AST 35 10/30/2022   ALT 32 10/30/2022   ALBUMIN 3.7 10/30/2022   ALKPHOS 56 10/30/2022   LIPASE 33 10/30/2022    Electrolytes Lab Results  Component Value Date   NA 135 10/30/2022   K 3.6 10/30/2022   CL 103 10/30/2022   CALCIUM 8.3 (L) 10/30/2022   MG 2.3 08/01/2022    Bone Lab Results  Component Value Date   25OHVITD1 41 08/01/2022   25OHVITD2 4.7 08/01/2022   25OHVITD3 36 08/01/2022    Inflammation (CRP: Acute Phase) (ESR: Chronic Phase) Lab Results  Component Value Date   CRP <1 08/01/2022   ESRSEDRATE 7 08/01/2022         Note: Above Lab results reviewed.  Recent Imaging Review  CT Abdomen Pelvis W Contrast CLINICAL DATA:  Acute left lower quadrant abdominal pain.  EXAM: CT ABDOMEN AND PELVIS WITH CONTRAST  TECHNIQUE: Multidetector CT imaging of the abdomen and pelvis was performed using the standard protocol following bolus administration of intravenous contrast.  RADIATION DOSE REDUCTION: This exam was performed according to the departmental dose-optimization program which includes automated exposure control, adjustment of the mA  and/or kV according to patient size and/or use of iterative reconstruction technique.  CONTRAST:  OMNIPAQUE IOHEXOL 300 MG/ML  SOLN  COMPARISON:  February 11, 2022.  FINDINGS: Lower chest: No acute abnormality.  Hepatobiliary: No focal liver abnormality is seen. No gallstones, gallbladder wall thickening, or biliary dilatation.  Pancreas: Unremarkable. No pancreatic ductal dilatation or surrounding inflammatory changes.  Spleen: Normal in size without focal abnormality.  Adrenals/Urinary Tract: Adrenal glands are unremarkable. Kidneys are normal, without renal calculi, focal lesion, or hydronephrosis. Bladder is unremarkable.  Stomach/Bowel: Stomach is within normal limits. Appendix appears normal. No evidence of bowel wall thickening, distention, or inflammatory changes.  Vascular/Lymphatic: Aortic atherosclerosis. No enlarged abdominal or pelvic lymph nodes.  Reproductive: Uterus and bilateral adnexa are unremarkable.  Other: Small fat containing periumbilical hernia.  No ascites.  Musculoskeletal: No acute or significant osseous findings.  IMPRESSION: Small fat containing periumbilical hernia.  No acute abnormality seen in the abdomen or pelvis.  Aortic Atherosclerosis (ICD10-I70.0).  Electronically Signed   By: Zenda Alpers.D.  On: 10/30/2022 12:49 Note: Reviewed        Physical Exam  General appearance: Well nourished, well developed, and well hydrated. In no apparent acute distress Mental status: Alert, oriented x 3 (person, place, & time)       Respiratory: No evidence of acute respiratory distress Eyes: PERLA Vitals: BP 133/73   Pulse 79   Temp 97.9 F (36.6 C) (Temporal)   Resp 16   Ht  (1.6 m)   Wt 145 lb (65.8 kg)   SpO2 98%   BMI 25.69 kg/m  BMI: Estimated body mass index is 25.69 kg/m as calculated from the following:   Height as of this encounter:  (1.6 m).   Weight as of this encounter: 145 lb (65.8 kg). Ideal: Ideal  body weight: 52.4 kg (115 lb 8.3 oz) Adjusted ideal body weight: 57.7 kg (127 lb 5 oz)  Assessment   Diagnosis Status  1. Lumbar facet joint syndrome   2. Lumbar facet arthropathy (Multilevel) (Bilateral)   3. Spondylosis without myelopathy or radiculopathy, lumbosacral region   4. DJD (degenerative joint disease), lumbosacral   5. DDD (degenerative disc disease), lumbosacral   6. Chronic low back pain (1ry area of Pain) (Bilateral) (R>L) w/o sciatica   7. Chronic hip pain (2ry area of Pain) (Bilateral) (L>R)   8. Chronic intermittent diarrhea in adult patient    Recurring Persistent Persistent   Updated Problems: Problem  Chronic intermittent diarrhea in adult patient    Plan of Care  Problem-specific:  No problem-specific Assessment & Plan notes found for this encounter.  [name] is not on any long-term medications.  Pharmacotherapy (Medications Ordered): No orders of the defined types were placed in this encounter.  Orders:  Orders Placed This Encounter  Procedures   Radiofrequency,Lumbar    Standing Status:   Future    Standing Expiration Date:   01/31/2023    Scheduling Instructions:     Side(s): Right-sided     Level: L4-5 and L5-S1 Facets (L3, L4, L5, and S1 Medial Branch)     Sedation: With Sedation.     Scheduling Timeframe: As soon as pre-approved    Order Specific Question:   Where will this procedure be performed?    Answer:   [facility]   Ambulatory referral to Gastroenterology    Referral Priority:   Routine    Referral Type:   Consultation    Referral Reason:   Specialty Services Required    Number of Visits Requested:   1   Follow-up plan:   Return for (ECT): (R) L-FCT RFA #1 (L4-5, L5-S1) (no steroids).      Interventional Therapies  Risk Factors  Considerations:    Most pain described to be from this level down except on right.    Planned  Pending:   Therapeutic right lumbar L4-5, L5-S1 facet RFA #1 (no steroids)    Under consideration:   Therapeutic bilateral lumbar facet (L4-5, L5-S1) RFA #1    Completed:   Diagnostic bilateral (L4-5, L5-S1) Lumbar Facet (L3-S1) MBB x2 (10/18/2022) (1st: 100/100/50 x 3 day) (2nd: ...   Completed by other providers:   By Dr. Yves Dill Therapeutic right L4-5 TFESI x2 (03/19/2021) by Merri Ray, DO [facility])  Therapeutic right L5-S1 TFESI x1 (03/19/2021) by Merri Ray, DO [facility])  Therapeutic right C6-7 TFESI x3 (05/19/2020) by Merri Ray, DO [facility])  Therapeutic right C6-7 TFESI x1 (04/30/2021) by Merri Ray, DO [facility])  Therapeutic  bilateral IA steroid knee inj. x1 (07/30/2021) by Lasandra Beech, PA ([facility])    Therapeutic  Palliative (PRN) options:   None established           Recent Visits Date Type Provider Dept  10/18/22 Procedure visit Delano Metz, MD [facility]  09/29/22 Office Visit Delano Metz, MD [facility]  09/15/22 Procedure visit Delano Metz, MD [facility]  09/05/22 Office Visit Delano Metz, MD [facility]  Showing recent visits within past 90 days and meeting all other requirements Today's Visits Date Type Provider Dept  11/01/22 Office Visit Delano Metz, MD [facility]  Showing today's visits and meeting all other requirements Future Appointments No visits were found meeting these conditions. Showing future appointments within next 90 days and meeting all other requirements  I discussed the assessment and treatment plan with the patient. The patient was provided an opportunity to ask questions and all were answered. The patient agreed with the plan and demonstrated an understanding of the instructions.  Patient advised to call back or seek an in-person evaluation if the symptoms or condition worsens.  Duration of encounter: 35 minutes.  Total time on encounter, as per AMA guidelines included both the face-to-face and  non-face-to-face time personally spent by the physician and/or other qualified health care professional(s) on the day of the encounter (includes time in activities that require the physician or other qualified health care professional and does not include time in activities normally performed by clinical staff). Physician's time may include the following activities when performed: Preparing to see the patient (e.g., pre-charting review of records, searching for previously ordered imaging, lab work, and nerve conduction tests) Review of prior analgesic pharmacotherapies. Reviewing PMP Interpreting ordered tests (e.g., lab work, imaging, nerve conduction tests) Performing post-procedure evaluations, including interpretation of diagnostic procedures Obtaining and/or reviewing separately obtained history Performing a medically appropriate examination and/or evaluation Counseling and educating the patient/family/caregiver Ordering medications, tests, or procedures Referring and communicating with other health care professionals (when not separately reported) Documenting clinical information in the electronic or other health record Independently interpreting results (not separately reported) and communicating results to the patient/ family/caregiver Care coordination (not separately reported)  Note by: Oswaldo Done, MD Date: 11/01/2022; Time: 2:59 PM

## 2022-11-01 NOTE — Progress Notes (Signed)
Safety precautions to be maintained throughout the outpatient stay will include: orient to surroundings, keep bed in low position, maintain call bell within reach at all times, provide assistance with transfer out of bed and ambulation.  

## 2022-11-02 ENCOUNTER — Telehealth: Payer: Self-pay

## 2022-11-02 NOTE — Telephone Encounter (Signed)
     Patient  visit on 4/14  at [place]   Have you been able to follow up with your primary care physician? No   The patient was or was not able to obtain any needed medicine or equipment. Yes   Are there diet recommendations that you are having difficulty following?  Yes   Patient expresses understanding of discharge instructions and education provided has no other needs at this time.  Yes      Lenard Forth [facility] Guide, [facility] [phone] [address] [place], [place], [place] [address] Phone: [phone] Email: [email or web address]

## 2022-11-24 ENCOUNTER — Ambulatory Visit: Payer: PPO | Attending: Pain Medicine | Admitting: Pain Medicine

## 2022-11-24 ENCOUNTER — Encounter: Payer: Self-pay | Admitting: Pain Medicine

## 2022-11-24 ENCOUNTER — Ambulatory Visit
Admission: RE | Admit: 2022-11-24 | Discharge: 2022-11-24 | Disposition: A | Discharge status: Home / Self Care | Payer: PPO | Source: Ambulatory Visit | Attending: Pain Medicine | Admitting: Pain Medicine

## 2022-11-24 ENCOUNTER — Telehealth: Payer: Self-pay | Admitting: Pain Medicine

## 2022-11-24 VITALS — BP 133/73 | HR 75 | Temp 97.3°F | Resp 15 | Ht 63.0 in | Wt 145.0 lb

## 2022-11-24 DIAGNOSIS — M47817 Spondylosis without myelopathy or radiculopathy, lumbosacral region: Secondary | ICD-10-CM | POA: Diagnosis not present

## 2022-11-24 DIAGNOSIS — M25552 Pain in left hip: Secondary | ICD-10-CM | POA: Insufficient documentation

## 2022-11-24 DIAGNOSIS — M545 Low back pain, unspecified: Secondary | ICD-10-CM | POA: Diagnosis not present

## 2022-11-24 DIAGNOSIS — M5137 Other intervertebral disc degeneration, lumbosacral region: Secondary | ICD-10-CM | POA: Insufficient documentation

## 2022-11-24 DIAGNOSIS — M5459 Other low back pain: Secondary | ICD-10-CM

## 2022-11-24 DIAGNOSIS — G8929 Other chronic pain: Secondary | ICD-10-CM

## 2022-11-24 DIAGNOSIS — Z5189 Encounter for other specified aftercare: Secondary | ICD-10-CM | POA: Diagnosis not present

## 2022-11-24 DIAGNOSIS — M47816 Spondylosis without myelopathy or radiculopathy, lumbar region: Secondary | ICD-10-CM | POA: Diagnosis not present

## 2022-11-24 DIAGNOSIS — M25551 Pain in right hip: Secondary | ICD-10-CM | POA: Diagnosis not present

## 2022-11-24 DIAGNOSIS — R937 Abnormal findings on diagnostic imaging of other parts of musculoskeletal system: Secondary | ICD-10-CM

## 2022-11-24 DIAGNOSIS — M51379 Other intervertebral disc degeneration, lumbosacral region without mention of lumbar back pain or lower extremity pain: Secondary | ICD-10-CM

## 2022-11-24 DIAGNOSIS — G8918 Other acute postprocedural pain: Secondary | ICD-10-CM

## 2022-11-24 MED ORDER — FENTANYL CITRATE (PF) 100 MCG/2ML IJ SOLN
25.0000 ug | INTRAMUSCULAR | Status: AC | PRN
  Administered 2022-11-24: 50 ug via INTRAVENOUS

## 2022-11-24 MED ORDER — PENTAFLUOROPROP-TETRAFLUOROETH EX AERO
INHALATION_SPRAY | Freq: Once | CUTANEOUS | Status: AC
  Administered 2022-11-24: 30 via TOPICAL
  Filled 2022-11-24: qty 116

## 2022-11-24 MED ORDER — ROPIVACAINE HCL 2 MG/ML IJ SOLN
9.0000 mL | Freq: Once | INTRAMUSCULAR | Status: AC
  Administered 2022-11-24: 9 mL via PERINEURAL

## 2022-11-24 MED ORDER — HYDROCODONE-ACETAMINOPHEN 5-325 MG PO TABS
1.0000 | ORAL_TABLET | Freq: Three times a day (TID) | ORAL | 0 refills | Status: AC | PRN
Start: 2022-12-01 — End: 2022-12-08

## 2022-11-24 MED ORDER — TRIAMCINOLONE ACETONIDE 40 MG/ML IJ SUSP
40.0000 mg | Freq: Once | INTRAMUSCULAR | Status: AC
  Administered 2022-11-24: 40 mg

## 2022-11-24 MED ORDER — MIDAZOLAM HCL 5 MG/5ML IJ SOLN
0.5000 mg | Freq: Once | INTRAMUSCULAR | Status: AC
  Administered 2022-11-24: 2 mg via INTRAVENOUS

## 2022-11-24 MED ORDER — LIDOCAINE HCL 2 % IJ SOLN
20.0000 mL | Freq: Once | INTRAMUSCULAR | Status: AC
  Administered 2022-11-24: 400 mg

## 2022-11-24 MED ORDER — LACTATED RINGERS IV SOLN: Freq: Once | INTRAVENOUS | Status: AC

## 2022-11-24 MED ORDER — FENTANYL CITRATE (PF) 100 MCG/2ML IJ SOLN
INTRAMUSCULAR | Status: AC
  Filled 2022-11-24: qty 2

## 2022-11-24 MED ORDER — MIDAZOLAM HCL 5 MG/5ML IJ SOLN
INTRAMUSCULAR | Status: AC
  Filled 2022-11-24: qty 5

## 2022-11-24 MED ORDER — TRIAMCINOLONE ACETONIDE 40 MG/ML IJ SUSP
INTRAMUSCULAR | Status: AC
  Filled 2022-11-24: qty 1

## 2022-11-24 MED ORDER — LIDOCAINE HCL 2 % IJ SOLN
INTRAMUSCULAR | Status: AC
  Filled 2022-11-24: qty 20

## 2022-11-24 MED ORDER — HYDROCODONE-ACETAMINOPHEN 5-325 MG PO TABS
1.0000 | ORAL_TABLET | Freq: Three times a day (TID) | ORAL | 0 refills | Status: AC | PRN
Start: 2022-11-24 — End: 2022-12-01

## 2022-11-24 NOTE — Patient Instructions (Addendum)
___________________________________________________________________________________________  Post-Radiofrequency (RF) Discharge Instructions  You have just completed a Radiofrequency Neurotomy.  The following instructions will provide you with information and guidelines for self-care upon discharge.  If at any time you have questions or concerns please call your physician. DO NOT DRIVE YOURSELF!!  Instructions: Apply ice: Fill a plastic sandwich bag with crushed ice. Cover it with a small towel and apply to injection site. Apply for 15 minutes then remove x 15 minutes. Repeat sequence on day of procedure, until you go to bed. The purpose is to minimize swelling and discomfort after procedure. Apply heat: Apply heat to procedure site starting the day following the procedure. The purpose is to treat any soreness and discomfort from the procedure. Food intake: No eating limitations, unless stipulated above.  Nevertheless, if you have had sedation, you may experience some nausea.  In this case, it may be wise to wait at least two hours prior to resuming regular diet. Physical activities: Keep activities to a minimum for the first 8 hours after the procedure. For the first 24 hours after the procedure, do not drive a motor vehicle,  Operate heavy machinery, power tools, or handle any weapons.  Consider walking with the use of an assistive device or accompanied by an adult for the first 24 hours.  Do not drink alcoholic beverages including beer.  Do not make any important decisions or sign any legal documents. Go home and rest today.  Resume activities tomorrow, as tolerated.  Use caution in moving about as you may experience mild leg weakness.  Use caution in cooking, use of household electrical appliances and climbing steps. Driving: If you have received any sedation, you are not allowed to drive for 24 hours after your procedure. Blood thinner: Restart your blood thinner 6 hours after your procedure.  (Only for those taking blood thinners) Insulin: As soon as you can eat, you may resume your normal dosing schedule. (Only for those taking insulin) Medications: May resume pre-procedure medications.  Do not take any drugs, other than what has been prescribed to you. Infection prevention: Keep procedure site clean and dry. Post-procedure Pain Diary: Extremely important that this be done correctly and accurately. Recorded information will be used to determine the next step in treatment. Pain evaluated is that of treated area only. Do not include pain from an untreated area. Complete every hour, on the hour, for the initial 8 hours. Set an alarm to help you do this part accurately. Do not go to sleep and have it completed later. It will not be accurate. Follow-up appointment: Keep your follow-up appointment after the procedure. Usually 2-6 weeks after radiofrequency. Bring you pain diary. The information collected will be essential for your long-term care.   Expect: From numbing medicine (AKA: Local Anesthetics): Numbness or decrease in pain. Onset: Full effect within 15 minutes of injected. Duration: It will depend on the type of local anesthetic used. On the average, 1 to 8 hours.  From steroids (when added): Decrease in swelling or inflammation. Once inflammation is improved, relief of the pain will follow. Onset of benefits: Depends on the amount of swelling present. The more swelling, the longer it will take for the benefits to be seen. In some cases, up to 10 days. Duration: Steroids will stay in the system x 2 weeks. Duration of benefits will depend on multiple posibilities including persistent irritating factors. From procedure: Some discomfort is to be expected once the numbing medicine wears off. In the case of radiofrequency procedures,   this may last as long as 6 weeks. Additional post-procedure pain medication is provided for this. Discomfort is minimized if ice and heat are applied as  instructed.  Call if: You experience numbness and weakness that gets worse with time, as opposed to wearing off. He experience any unusual bleeding, difficulty breathing, or loss of the ability to control your bowel and bladder. (This applies to Spinal procedures only) You experience any redness, swelling, heat, red streaks, elevated temperature, fever, or any other signs of a possible infection.  Emergency Numbers: Durning business hours (Monday - Thursday, 8:00 AM - 4:00 PM) (Friday, 9:00 AM - 12:00 Noon): [phone] After hours: [phone] ____________________________________________________________________________________________   ______________________________________________________________________  Procedure instructions  Do not eat or drink fluids (other than water) for 6 hours before your procedure  No water for 2 hours before your procedure  Take your blood pressure medicine with a sip of water  Arrive 30 minutes before your appointment  Carefully read the "Preparing for your procedure" detailed instructions  If you have questions call us at [phone]  _____________________________________________________________________    ______________________________________________________________________  Preparing for your procedure  Appointments: If you think you may not be able to keep your appointment, call 24-48 hours in advance to cancel. We need time to make it available to others.  During your procedure appointment there will be: No Prescription Refills. No disability issues to discussed. No medication changes or discussions.  Instructions: Food intake: Avoid eating anything solid for at least 8 hours prior to your procedure. Clear liquid intake: You may take clear liquids such as water up to 2 hours prior to your procedure. (No carbonated drinks. No soda.) Transportation: Unless otherwise stated by your physician, bring a driver. Morning  Medicines: Except for blood thinners, take all of your other morning medications with a sip of water. Make sure to take your heart and blood pressure medicines. If your blood pressure's lower number is above 100, the case will be rescheduled. Blood thinners: Make sure to stop your blood thinners as instructed.  If you take a blood thinner, but were not instructed to stop it, call our office [phone] and ask to talk to a nurse. Not stopping a blood thinner prior to certain procedures could lead to serious complications. Diabetics on insulin: Notify the staff so that you can be scheduled 1st case in the morning. If your diabetes requires high dose insulin, take only  of your normal insulin dose the morning of the procedure and notify the staff that you have done so. Preventing infections: Shower with an antibacterial soap the morning of your procedure.  Build-up your immune system: Take 1000 mg of Vitamin C with every meal (3 times a day) the day prior to your procedure. Antibiotics: Inform the nursing staff if you are taking any antibiotics or if you have any conditions that may require antibiotics prior to procedures. (Example: recent joint implants)   Pregnancy: If you are pregnant make sure to notify the nursing staff. Not doing so may result in injury to the fetus, including death.  Sickness: If you have a cold, fever, or any active infections, call and cancel or reschedule your procedure. Receiving steroids while having an infection may result in complications. Arrival: You must be in the facility at least 30 minutes prior to your scheduled procedure. Tardiness: Your scheduled time is also the cutoff time. If you do not arrive at least 15 minutes prior to your procedure, you will be rescheduled.    Children: Do not bring any children with you. Make arrangements to keep them home. Dress appropriately: There is always a possibility that your clothing may get soiled. Avoid long dresses. Valuables:  Do not bring any jewelry or valuables.  Reasons to call and reschedule or cancel your procedure: (Following these recommendations will minimize the risk of a serious complication.) Surgeries: Avoid having procedures within 2 weeks of any surgery. (Avoid for 2 weeks before or after any surgery). Flu Shots: Avoid having procedures within 2 weeks of a flu shots or . (Avoid for 2 weeks before or after immunizations). Barium: Avoid having a procedure within 7-10 days after having had a radiological study involving the use of radiological contrast. (Myelograms, Barium swallow or enema study). Heart attacks: Avoid any elective procedures or surgeries for the initial 6 months after a "Myocardial Infarction" (Heart Attack). Blood thinners: It is imperative that you stop these medications before procedures. Let us know if you if you take any blood thinner.  Infection: Avoid procedures during or within two weeks of an infection (including chest colds or gastrointestinal problems). Symptoms associated with infections include: Localized redness, fever, chills, night sweats or profuse sweating, burning sensation when voiding, cough, congestion, stuffiness, runny nose, sore throat, diarrhea, nausea, vomiting, cold or Flu symptoms, recent or current infections. It is specially important if the infection is over the area that we intend to treat. Heart and lung problems: Symptoms that may suggest an active cardiopulmonary problem include: cough, chest pain, breathing difficulties or shortness of breath, dizziness, ankle swelling, uncontrolled high or unusually low blood pressure, and/or palpitations. If you are experiencing any of these symptoms, cancel your procedure and contact your primary care physician for an evaluation.  Remember:  Regular Business hours are:  Monday to Thursday 8:00 AM to 4:00 PM  Provider's Schedule: Eulis Salazar, MD:  Procedure days: Tuesday and Thursday 7:30 AM to 4:00 PM  Bilal  Lateef, MD:  Procedure days: Monday and Wednesday 7:30 AM to 4:00 PM  ______________________________________________________________________    ____________________________________________________________________________________________  General Risks and Possible Complications  Patient Responsibilities: It is important that you read this as it is part of your informed consent. It is our duty to inform you of the risks and possible complications associated with treatments offered to you. It is your responsibility as a patient to read this and to ask questions about anything that is not clear or that you believe was not covered in this document.  Patient's Rights: You have the right to refuse treatment. You also have the right to change your mind, even after initially having agreed to have the treatment done. However, under this last option, if you wait until the last second to change your mind, you may be charged for the materials used up to that point.  Introduction: Medicine is not an exact science. Everything in Medicine, including the lack of treatment(s), carries the potential for danger, harm, or loss (which is by definition: Risk). In Medicine, a complication is a secondary problem, condition, or disease that can aggravate an already existing one. All treatments carry the risk of possible complications. The fact that a side effects or complications occurs, does not imply that the treatment was conducted incorrectly. It must be clearly understood that these can happen even when everything is done following the highest safety standards.  No treatment: You can choose not to proceed with the proposed treatment alternative. The "PRO(s)" would include: avoiding the risk of complications associated with the therapy. The "CON(s)" would   include: not getting any of the treatment benefits. These benefits fall under one of three categories: diagnostic; therapeutic; and/or palliative. Diagnostic benefits  include: getting information which can ultimately lead to improvement of the disease or symptom(s). Therapeutic benefits are those associated with the successful treatment of the disease. Finally, palliative benefits are those related to the decrease of the primary symptoms, without necessarily curing the condition (example: decreasing the pain from a flare-up of a chronic condition, such as incurable terminal cancer).  General Risks and Complications: These are associated to most interventional treatments. They can occur alone, or in combination. They fall under one of the following six (6) categories: no benefit or worsening of symptoms; bleeding; infection; nerve damage; allergic reactions; and/or death. No benefits or worsening of symptoms: In Medicine there are no guarantees, only probabilities. No healthcare provider can ever guarantee that a medical treatment will work, they can only state the probability that it may. Furthermore, there is always the possibility that the condition may worsen, either directly, or indirectly, as a consequence of the treatment. Bleeding: This is more common if the patient is taking a blood thinner, either prescription or over the counter (example: Goody Powders, Fish oil, Aspirin, Garlic, etc.), or if suffering a condition associated with impaired coagulation (example: Hemophilia, cirrhosis of the liver, low platelet counts, etc.). However, even if you do not have one on these, it can still happen. If you have any of these conditions, or take one of these drugs, make sure to notify your treating physician. Infection: This is more common in patients with a compromised immune system, either due to disease (example: diabetes, cancer, human immunodeficiency virus [HIV], etc.), or due to medications or treatments (example: therapies used to treat cancer and rheumatological diseases). However, even if you do not have one on these, it can still happen. If you have any of these  conditions, or take one of these drugs, make sure to notify your treating physician. Nerve Damage: This is more common when the treatment is an invasive one, but it can also happen with the use of medications, such as those used in the treatment of cancer. The damage can occur to small secondary nerves, or to large primary ones, such as those in the spinal cord and brain. This damage may be temporary or permanent and it may lead to impairments that can range from temporary numbness to permanent paralysis and/or brain death. Allergic Reactions: Any time a substance or material comes in contact with our body, there is the possibility of an allergic reaction. These can range from a mild skin rash (contact dermatitis) to a severe systemic reaction (anaphylactic reaction), which can result in death. Death: In general, any medical intervention can result in death, most of the time due to an unforeseen complication. ____________________________________________________________________________________________    

## 2022-11-24 NOTE — Progress Notes (Signed)
PROVIDER NOTE: Interpretation of information contained herein should be left to medically-trained personnel. Specific patient instructions are provided elsewhere under "Patient Instructions" section of medical record. This document was created in part using STT-dictation technology, any transcriptional errors that may result from this process are unintentional.  Patient: Alyssa Vega Type: Established DOB: 1955-08-28 MRN: [id number] PCP: Marisue Ivan, MD  Service: Procedure DOS: 11/24/2022 Setting: Ambulatory Location: Ambulatory outpatient facility Delivery: Face-to-face Provider: Oswaldo Done, MD Specialty: Interventional Pain Management Specialty designation: 09 Location: Outpatient facility Ref. Prov.: Delano Metz, MD       Interventional Therapy   Procedure: Lumbar Facet, Medial Branch Radiofrequency Ablation (RFA) #1  Laterality: Right (-RT)  Level: L3, L4, L5, and S1 Medial Branch Level(s). These levels will denervate the L4-5 and L5-S1 lumbar facet joints.  Imaging: Fluoroscopy-guided         Anesthesia: Local anesthesia (1-2% Lidocaine) Anxiolysis: IV Versed 2.0 mg Sedation: Moderate Sedation Fentanyl 1 mL (50 mcg) DOS: 11/24/2022  Performed by: Oswaldo Done, MD  Purpose: Therapeutic/Palliative Indications: Low back pain severe enough to impact quality of life or function. Indications: 1. Lumbar facet joint syndrome   2. Lumbar facet joint pain   3. Lumbar facet arthropathy (Multilevel) (Bilateral)   4. Spondylosis without myelopathy or radiculopathy, lumbosacral region   5. DJD (degenerative joint disease), lumbosacral   6. DDD (degenerative disc disease), lumbosacral   7. Chronic low back pain (1ry area of Pain) (Bilateral) (R>L) w/o sciatica   8. Abnormal MRI, lumbar spine (03/20/2021)    Alyssa Vega has been dealing with the above chronic pain for longer than three months and has either failed to respond, was unable to tolerate, or simply did  not get enough benefit from other more conservative therapies including, but not limited to: 1. Over-the-counter medications 2. Anti-inflammatory medications 3. Muscle relaxants 4. Membrane stabilizers 5. Opioids 6. Physical therapy and/or chiropractic manipulation 7. Modalities (Heat, ice, etc.) 8. Invasive techniques such as nerve blocks. Alyssa Vega has attained more than 50% relief of the pain from a series of diagnostic injections conducted in separate occasions.  Pain Score: Pre-procedure: 6 /10 Post-procedure: 0-No pain/10     Position / Prep / Materials:  Position: Prone  Prep solution: DuraPrep (Iodine Povacrylex [0.7% available iodine] and Isopropyl Alcohol, 74% w/w) Prep Area: Entire Lumbosacral Region (Lower back from mid-thoracic region to end of tailbone and from flank to flank.) Materials:  Tray: RFA (Radiofrequency) tray Needle(s):  Type: RFA (Teflon-coated radiofrequency ablation needles) Gauge (G): 22  Length: Regular (10cm) Qty: 4      Pre-op H&P Assessment:  Alyssa Vega is a 67 y.o. (year old), female patient, seen today for interventional treatment. She  has a past surgical history that includes Ovarian cyst removal; Colonoscopy with propofol (N/A, 04/20/2020); and Esophagogastroduodenoscopy (egd) with propofol (N/A, 04/20/2020). Alyssa Vega has a current medication list which includes the following prescription(s): alendronate, diclofenac, ezetimibe, glucosamine-chondroitin, hydrocodone-acetaminophen, [START ON 12/01/2022] hydrocodone-acetaminophen, multivitamin, naproxen sodium, and pantoprazole. Her primarily concern today is the Back Pain (R>L)  Initial Vital Signs:  Pulse/HCG Rate: 75ECG Heart Rate: 70 (NSR) Temp: 97.7 F (36.5 C) Resp: 18 BP: 138/70 SpO2: 98 %  BMI: Estimated body mass index is 25.69 kg/m as calculated from the following:   Height as of this encounter: 5\' 3"  (1.6 m).   Weight as of this encounter: 145 lb (65.8 kg).  Risk  Assessment: Allergies: Reviewed. She is allergic to duloxetine, atorvastatin, cyclobenzaprine, ibuprofen, melatonin, meloxicam, tizanidine, and venlafaxine.  Allergy Precautions: None required Coagulopathies: Reviewed. None identified.  Blood-thinner therapy: None at this time Active Infection(s): Reviewed. None identified. Alyssa Vega is afebrile  Site Confirmation: Alyssa Vega was asked to confirm the procedure and laterality before marking the site Procedure checklist: Completed Consent: Before the procedure and under the influence of no sedative(s), amnesic(s), or anxiolytics, the patient was informed of the treatment options, risks and possible complications. To fulfill our ethical and legal obligations, as recommended by the American Medical Association's Code of Ethics, I have informed the patient of my clinical impression; the nature and purpose of the treatment or procedure; the risks, benefits, and possible complications of the intervention; the alternatives, including doing nothing; the risk(s) and benefit(s) of the alternative treatment(s) or procedure(s); and the risk(s) and benefit(s) of doing nothing. The patient was provided information about the general risks and possible complications associated with the procedure. These may include, but are not limited to: failure to achieve desired goals, infection, bleeding, organ or nerve damage, allergic reactions, paralysis, and death. In addition, the patient was informed of those risks and complications associated to Spine-related procedures, such as failure to decrease pain; infection (i.e.: Meningitis, epidural or intraspinal abscess); bleeding (i.e.: epidural hematoma, subarachnoid hemorrhage, or any other type of intraspinal or peri-dural bleeding); organ or nerve damage (i.e.: Any type of peripheral nerve, nerve root, or spinal cord injury) with subsequent damage to sensory, motor, and/or autonomic systems, resulting in permanent pain, numbness,  and/or weakness of one or several areas of the body; allergic reactions; (i.e.: anaphylactic reaction); and/or death. Furthermore, the patient was informed of those risks and complications associated with the medications. These include, but are not limited to: allergic reactions (i.e.: anaphylactic or anaphylactoid reaction(s)); adrenal axis suppression; blood sugar elevation that in diabetics may result in ketoacidosis or comma; water retention that in patients with history of congestive heart failure may result in shortness of breath, pulmonary edema, and decompensation with resultant heart failure; weight gain; swelling or edema; medication-induced neural toxicity; particulate matter embolism and blood vessel occlusion with resultant organ, and/or nervous system infarction; and/or aseptic necrosis of one or more joints. Finally, the patient was informed that Medicine is not an exact science; therefore, there is also the possibility of unforeseen or unpredictable risks and/or possible complications that may result in a catastrophic outcome. The patient indicated having understood very clearly. We have given the patient no guarantees and we have made no promises. Enough time was given to the patient to ask questions, all of which were answered to the patient's satisfaction. Ms. Sawhney has indicated that she wanted to continue with the procedure. Attestation: I, the ordering provider, attest that I have discussed with the patient the benefits, risks, side-effects, alternatives, likelihood of achieving goals, and potential problems during recovery for the procedure that I have provided informed consent. Date  Time: 11/24/2022  8:36 AM   Pre-Procedure Preparation:  Monitoring: As per clinic protocol. Respiration, ETCO2, SpO2, BP, heart rate and rhythm monitor placed and checked for adequate function Safety Precautions: Patient was assessed for positional comfort and pressure points before starting the  procedure. Time-out: I initiated and conducted the "Time-out" before starting the procedure, as per protocol. The patient was asked to participate by confirming the accuracy of the "Time Out" information. Verification of the correct person, site, and procedure were performed and confirmed by me, the nursing staff, and the patient. "Time-out" conducted as per Joint Commission's Universal Protocol (UP.01.01.01). Time: 0925 Start Time: 0925 hrs.  Description  of Procedure:          Laterality: See above. Levels:  See above. Safety Precautions: Aspiration looking for blood return was conducted prior to all injections. At no point did we inject any substances, as a needle was being advanced. Before injecting, the patient was told to immediately notify me if she was experiencing any new onset of "ringing in the ears, or metallic taste in the mouth". No attempts were made at seeking any paresthesias. Safe injection practices and needle disposal techniques used. Medications properly checked for expiration dates. SDV (single dose vial) medications used. After the completion of the procedure, all disposable equipment used was discarded in the proper designated medical waste containers. Local Anesthesia: Protocol guidelines were followed. The patient was positioned over the fluoroscopy table. The area was prepped in the usual manner. The time-out was completed. The target area was identified using fluoroscopy. A 12-in long, straight, sterile hemostat was used with fluoroscopic guidance to locate the targets for each level blocked. Once located, the skin was marked with an approved surgical skin marker. Once all sites were marked, the skin (epidermis, dermis, and hypodermis), as well as deeper tissues (fat, connective tissue and muscle) were infiltrated with a small amount of a short-acting local anesthetic, loaded on a 10cc syringe with a 25G, 1.5-in  Needle. An appropriate amount of time was allowed for local  anesthetics to take effect before proceeding to the next step. Technical description of process:  Radiofrequency Ablation (RFA) L2 Medial Branch Nerve RFA: The target area for the L2 medial branch is at the junction of the postero-lateral aspect of the superior articular process and the superior, posterior, and medial edge of the transverse process of L3. Under fluoroscopic guidance, a Radiofrequency needle was inserted until contact was made with os over the superior postero-lateral aspect of the pedicular shadow (target area). Sensory and motor testing was conducted to properly adjust the position of the needle. Once satisfactory placement of the needle was achieved, the numbing solution was slowly injected after negative aspiration for blood. 2.0 mL of the nerve block solution was injected without difficulty or complication. After waiting for at least 3 minutes, the ablation was performed. Once completed, the needle was removed intact. L3 Medial Branch Nerve RFA: The target area for the L3 medial branch is at the junction of the postero-lateral aspect of the superior articular process and the superior, posterior, and medial edge of the transverse process of L4. Under fluoroscopic guidance, a Radiofrequency needle was inserted until contact was made with os over the superior postero-lateral aspect of the pedicular shadow (target area). Sensory and motor testing was conducted to properly adjust the position of the needle. Once satisfactory placement of the needle was achieved, the numbing solution was slowly injected after negative aspiration for blood. 2.0 mL of the nerve block solution was injected without difficulty or complication. After waiting for at least 3 minutes, the ablation was performed. Once completed, the needle was removed intact. L4 Medial Branch Nerve RFA: The target area for the L4 medial branch is at the junction of the postero-lateral aspect of the superior articular process and the  superior, posterior, and medial edge of the transverse process of L5. Under fluoroscopic guidance, a Radiofrequency needle was inserted until contact was made with os over the superior postero-lateral aspect of the pedicular shadow (target area). Sensory and motor testing was conducted to properly adjust the position of the needle. Once satisfactory placement of the needle was achieved, the numbing  solution was slowly injected after negative aspiration for blood. 2.0 mL of the nerve block solution was injected without difficulty or complication. After waiting for at least 3 minutes, the ablation was performed. Once completed, the needle was removed intact. L5 Medial Branch Nerve RFA: The target area for the L5 medial branch is at the junction of the postero-lateral aspect of the superior articular process of S1 and the superior, posterior, and medial edge of the sacral ala. Under fluoroscopic guidance, a Radiofrequency needle was inserted until contact was made with os over the superior postero-lateral aspect of the pedicular shadow (target area). Sensory and motor testing was conducted to properly adjust the position of the needle. Once satisfactory placement of the needle was achieved, the numbing solution was slowly injected after negative aspiration for blood. 2.0 mL of the nerve block solution was injected without difficulty or complication. After waiting for at least 3 minutes, the ablation was performed. Once completed, the needle was removed intact. S1 Medial Branch Nerve RFA: The target area for the S1 medial branch is located inferior to the junction of the S1 superior articular process and the L5 inferior articular process, posterior, inferior, and lateral to the 6 o'clock position of the L5-S1 facet joint, just superior to the S1 posterior foramen. Under fluoroscopic guidance, the Radiofrequency needle was advanced until contact was made with os over the Target area. Sensory and motor testing was  conducted to properly adjust the position of the needle. Once satisfactory placement of the needle was achieved, the numbing solution was slowly injected after negative aspiration for blood. 2.0 mL of the nerve block solution was injected without difficulty or complication. After waiting for at least 3 minutes, the ablation was performed. Once completed, the needle was removed intact. Radiofrequency lesioning (ablation):  Radiofrequency Generator: Medtronic AccurianTM AG 1000 RF Generator Sensory Stimulation Parameters: 50 Hz was used to locate & identify the nerve, making sure that the needle was positioned such that there was no sensory stimulation below 0.3 V or above 0.7 V. Motor Stimulation Parameters: 2 Hz was used to evaluate the motor component. Care was taken not to lesion any nerves that demonstrated motor stimulation of the lower extremities at an output of less than 2.5 times that of the sensory threshold, or a maximum of 2.0 V. Lesioning Technique Parameters: Standard Radiofrequency settings. (Not bipolar or pulsed.) Temperature Settings: 80 degrees C Lesioning time: 60 seconds Intra-operative Compliance: Compliant  Once the entire procedure was completed, the treated area was cleaned, making sure to leave some of the prepping solution back to take advantage of its long term bactericidal properties.    Illustration of the posterior view of the lumbar spine and the posterior neural structures. Laminae of L2 through S1 are labeled. DPRL5, dorsal primary ramus of L5; DPRS1, dorsal primary ramus of S1; DPR3, dorsal primary ramus of L3; FJ, facet (zygapophyseal) joint L3-L4; I, inferior articular process of L4; LB1, lateral branch of dorsal primary ramus of L1; IAB, inferior articular branches from L3 medial branch (supplies L4-L5 facet joint); IBP, intermediate branch plexus; MB3, medial branch of dorsal primary ramus of L3; NR3, third lumbar nerve root; S, superior articular process of L5; SAB,  superior articular branches from L4 (supplies L4-5 facet joint also); TP3, transverse process of L3.  Facet Joint Innervation (* possible contribution)  L1-2 T12, L1 (L2*)  Medial Branch  L2-3 L1, L2 (L3*)         "          "  L3-4 L2, L3 (L4*)         "          "  L4-5 L3, L4 (L5*)         "          "  L5-S1 L4, L5, S1          "          "    Vitals:   11/24/22 0948 11/24/22 0956 11/24/22 1006 11/24/22 1016  BP: 129/69 127/64 127/68 133/73  Pulse:      Resp: 18 20 19 15   Temp:  (!) 97.4 F (36.3 C)  (!) 97.3 F (36.3 C)  TempSrc:  Temporal  Temporal  SpO2: 98% 97% 91% 99%  Weight:      Height:        Start Time: 0925 hrs. End Time: 0948 hrs.  Imaging Guidance (Spinal):          Type of Imaging Technique: Fluoroscopy Guidance (Spinal) Indication(s): Assistance in needle guidance and placement for procedures requiring needle placement in or near specific anatomical locations not easily accessible without such assistance. Exposure Time: Please see nurses notes. Contrast: None used. Fluoroscopic Guidance: I was personally present during the use of fluoroscopy. "Tunnel Vision Technique" used to obtain the best possible view of the target area. Parallax error corrected before commencing the procedure. "Direction-depth-direction" technique used to introduce the needle under continuous pulsed fluoroscopy. Once target was reached, antero-posterior, oblique, and lateral fluoroscopic projection used confirm needle placement in all planes. Images permanently stored in EMR. Interpretation: No contrast injected. I personally interpreted the imaging intraoperatively. Adequate needle placement confirmed in multiple planes. Permanent images saved into the patient's record.  Antibiotic Prophylaxis:   Anti-infectives (From admission, onward)    None      Indication(s): None identified  Post-operative Assessment:  Post-procedure Vital Signs:  Pulse/HCG Rate: 7562 Temp: (!) 97.3 F  (36.3 C) Resp: 15 BP: 133/73 SpO2: 99 %  EBL: None  Complications: No immediate post-treatment complications observed by team, or reported by patient.  Note: The patient tolerated the entire procedure well. A repeat set of vitals were taken after the procedure and the patient was kept under observation following institutional policy, for this type of procedure. Post-procedural neurological assessment was performed, showing return to baseline, prior to discharge. The patient was provided with post-procedure discharge instructions, including a section on how to identify potential problems. Should any problems arise concerning this procedure, the patient was given instructions to immediately contact us, at any time, without hesitation. In any case, we plan to contact the patient by telephone for a follow-up status report regarding this interventional procedure.  Comments:  No additional relevant information.  Plan of Care (POC)  Orders:  Orders Placed This Encounter  Procedures   Radiofrequency,Lumbar    Scheduling Instructions:     Side(s): Right-sided     Level: L4-5 and L5-S1 Facets (L3, L4, L5, and S1 Medial Branch)     Sedation: With Sedation.     Timeframe: Today    Order Specific Question:   Where will this procedure be performed?    Answer:   [facility]   Radiofrequency,Lumbar    Standing Status:   Future    Standing Expiration Date:   02/24/2023    Scheduling Instructions:     Side(s): Left-sided     Level: L4-5 and L5-S1 Facets (L3, L4, L5, and S1 Medial Branch)     Sedation:  With Sedation.     Scheduling Timeframe: As soon as pre-approved    Order Specific Question:   Where will this procedure be performed?    Answer:   [facility]   DG PAIN CLINIC C-ARM 1-60 MIN NO REPORT    Intraoperative interpretation by procedural physician at [facility].    Standing Status:   Standing    Number of Occurrences:   1    Order Specific Question:   Reason  for exam:    Answer:   Assistance in needle guidance and placement for procedures requiring needle placement in or near specific anatomical locations not easily accessible without such assistance.   Informed Consent Details: Physician/Practitioner Attestation; Transcribe to consent form and obtain patient signature    Nursing Order: Transcribe to consent form and obtain patient signature. Note: Always confirm laterality of pain with Ms. Christianson, before procedure.    Order Specific Question:   Physician/Practitioner attestation of informed consent for procedure/surgical case    Answer:   I, the physician/practitioner, attest that I have discussed with the patient the benefits, risks, side effects, alternatives, likelihood of achieving goals and potential problems during recovery for the procedure that I have provided informed consent.    Order Specific Question:   Procedure    Answer:   Lumbar Facet Radiofrequency Ablation    Order Specific Question:   Physician/Practitioner performing the procedure    Answer:   Talyah Seder A. Laban Emperor, MD    Order Specific Question:   Indication/Reason    Answer:   Low Back Pain, with our without leg pain, due to Facet Joint Arthralgia (Joint Pain) known as Lumbar Facet Syndrome, secondary to Lumbar, and/or Lumbosacral Spondylosis (Arthritis of the Spine), without myelopathy or radiculopathy (Nerve Damage).   Provide equipment / supplies at bedside    Procedure tray: "Radiofrequency Tray" Additional material: Large hemostat (x1); Small hemostat (x1); Towels (x8); 4x4 sterile sponge pack (x1) Needle type: Teflon-coated Radiofrequency Needle (Disposable  single use) Size: Regular Quantity: 4    Standing Status:   Standing    Number of Occurrences:   1    Order Specific Question:   Specify    Answer:   Radiofrequency Tray   Follow-up    Schedule Ms. Maeder for a post-procedure follow-up evaluation encounter 6 weeks from now.    Standing Status:   Future    Standing  Expiration Date:   01/05/2023    Scheduling Instructions:     Schedule follow-up visit on afternoon of procedure day (T, Th)     Type: Face-to-face (F2F) Post-procedure (PP) evaluation (E/M)     When: 6 weeks from now   Chronic Opioid Analgesic:  None MME/day: 0 mg/day   Medications ordered for procedure: Meds ordered this encounter  Medications   HYDROcodone-acetaminophen (NORCO/VICODIN) 5-325 MG tablet    Sig: Take 1 tablet by mouth every 8 (eight) hours as needed for up to 7 days for severe pain. Must last 7 days.    Dispense:  21 tablet    Refill:  0    For acute post-operative pain. Not to be refilled. Must last 7 days.   HYDROcodone-acetaminophen (NORCO/VICODIN) 5-325 MG tablet    Sig: Take 1 tablet by mouth every 8 (eight) hours as needed for up to 7 days for severe pain. Must last for 7 days.    Dispense:  21 tablet    Refill:  0    For acute post-operative pain. Not to be refilled. Must  last 7 days.   lidocaine (XYLOCAINE) 2 % (with pres) injection 400 mg   pentafluoroprop-tetrafluoroeth (GEBAUERS) aerosol   lactated ringers infusion   midazolam (VERSED) 5 MG/5ML injection 0.5-2 mg    Make sure Flumazenil is available in the pyxis when using this medication. If oversedation occurs, administer 0.2 mg IV over 15 sec. If after 45 sec no response, administer 0.2 mg again over 1 min; may repeat at 1 min intervals; not to exceed 4 doses (1 mg)   fentaNYL (SUBLIMAZE) injection 25-50 mcg    Make sure Narcan is available in the pyxis when using this medication. In the event of respiratory depression (RR< 8/min): Titrate NARCAN (naloxone) in increments of 0.1 to 0.2 mg IV at 2-3 minute intervals, until desired degree of reversal.   ropivacaine (PF) 2 mg/mL (0.2%) (NAROPIN) injection 9 mL   triamcinolone acetonide (KENALOG-40) injection 40 mg   Medications administered: We administered lidocaine, pentafluoroprop-tetrafluoroeth, lactated ringers, midazolam, fentaNYL, ropivacaine (PF) 2  mg/mL (0.2%), and triamcinolone acetonide.  See the medical record for exact dosing, route, and time of administration.  Follow-up plan:   Return in about 6 weeks (around 01/05/2023) for (ECT): (L) (L4-5, L5-S1) L-FCT RFA #1 (w/o STEROIDS) + PPE.       Interventional Therapies  Risk Factors  Considerations:    Most pain described to be from this level down except on right.    Planned  Pending:   Therapeutic right lumbar L4-5, L5-S1 facet RFA #1 (no steroids) (11/24/2022)    Under consideration:   Therapeutic bilateral lumbar facet (L4-5, L5-S1) RFA #1    Completed:   Diagnostic bilateral (L4-5, L5-S1) Lumbar Facet (L3-S1) MBB x2 (10/18/2022) (1st: 100/100/50 x 3 day) (2nd: ...   Completed by other providers:   By Dr. Yves Dill Therapeutic right L4-5 TFESI x2 (03/19/2021) by Merri Ray, DO [facility] [facility] PMR)  Therapeutic right L5-S1 TFESI x1 (03/19/2021) by Merri Ray, DO [facility] PMR)  Therapeutic right C6-7 TFESI x3 (05/19/2020) by Merri Ray, DO [facility] PMR)  Therapeutic right C6-7 TFESI x1 (04/30/2021) by Merri Ray, DO [facility] PMR)  Therapeutic bilateral IA steroid knee inj. x1 (07/30/2021) by Lasandra Beech, PA ([facility])    Therapeutic  Palliative (PRN) options:   None established      Recent Visits Date Type Provider Dept  11/24/22 Procedure visit Delano Metz, MD [facility]  11/01/22 Office Visit Delano Metz, MD [facility]  10/18/22 Procedure visit Delano Metz, MD [facility]  09/29/22 Office Visit Delano Metz, MD [facility]  09/15/22 Procedure visit Delano Metz, MD [facility]  09/05/22 Office Visit Delano Metz, MD [facility]  Showing recent visits within past 90 days and meeting all other requirements Future Appointments Date Type Provider Dept  01/05/23 Appointment Delano Metz, MD [facility]  Showing future appointments within next 90 days and  meeting all other requirements  Disposition: Discharge home  Discharge (Date  Time): 11/24/2022; 1029 hrs.   Primary Care Physician: Marisue Ivan, MD Location: [facility] Note by: Oswaldo Done, MD (TTS technology used. I apologize for any typographical errors that were not detected and corrected.) Date: 11/24/2022; Time: 5:47 AM  Disclaimer:  Medicine is not an Visual merchandiser. The only guarantee in medicine is that nothing is guaranteed. It is important to note that the decision to proceed with this intervention was based on the information collected from the patient. The Data and conclusions were drawn from the patient's questionnaire,  the interview, and the physical examination. Because the information was provided in large part by the patient, it cannot be guaranteed that it has not been purposely or unconsciously manipulated. Every effort has been made to obtain as much relevant data as possible for this evaluation. It is important to note that the conclusions that lead to this procedure are derived in large part from the available data. Always take into account that the treatment will also be dependent on availability of resources and existing treatment guidelines, considered by other Pain Management Practitioners as being common knowledge and practice, at the time of the intervention. For Medico-Legal purposes, it is also important to point out that variation in procedural techniques and pharmacological choices are the acceptable norm. The indications, contraindications, technique, and results of the above procedure should only be interpreted and judged by a Board-Certified Interventional Pain Specialist with extensive familiarity and expertise in the same exact procedure and technique.

## 2022-11-24 NOTE — Progress Notes (Signed)
Safety precautions to be maintained throughout the outpatient stay will include: orient to surroundings, keep bed in low position, maintain call bell within reach at all times, provide assistance with transfer out of bed and ambulation.  

## 2022-11-24 NOTE — Telephone Encounter (Signed)
PT called stated that she has questions she will like to ask nurse about the procedure that she got done today. Please give patient a call. TY

## 2022-11-24 NOTE — Telephone Encounter (Signed)
Patient called wanting to ask a few questions. Patient asked if it was normal to still have numbness and weakness on the right leg. Patient states she can bear weight on the leg, she can move the right leg, and she can walk as well. Informed patient, as advised by Chaya Jan, RN., that it is expected to have numbness today and weakness for a few days after. It is to be expected from this procedure to have some pain for a few weeks. Informed patient also she has pain medication prescription to be picked up today and another pain medication prescription to be picked next week.   Patient verbalized understanding and answered all questions. She is aware she will receive a call tomorrow from [facility] for a follow-up.   No interpreter used as this RN speaks fluent spanish.   Earlyne Iba, RN

## 2022-11-25 ENCOUNTER — Telehealth: Payer: Self-pay | Admitting: *Deleted

## 2022-11-25 NOTE — Telephone Encounter (Signed)
Post procedure call:   no  questions or concerns.  

## 2022-12-29 NOTE — Progress Notes (Signed)
PROVIDER NOTE: Information contained herein reflects review and annotations entered in association with encounter. Interpretation of such information and data should be left to medically-trained personnel. Information provided to patient can be located elsewhere in the medical record under "Patient Instructions". Document created using STT-dictation technology, any transcriptional errors that may result from process are unintentional.    Patient: Alyssa Vega  Service Category: E/M  Provider: Oswaldo Done, MD  DOB: 10-Aug-1955  DOS: 01/02/2023  Referring Provider: Marisue Ivan, MD  MRN: [id number]  Specialty: Interventional Pain Management  PCP: Marisue Ivan, MD  Type: Established Patient  Setting: Ambulatory outpatient    Location: Office  Delivery: Face-to-face     HPI  Alyssa Vega, a 67 y.o. year old female, is here today because of her Chronic radicular pain of lower extremity [M54.10, G89.29]. Alyssa Vega primary complain today is Back Pain (Lower right side)  Pertinent problems: Alyssa Vega has Abdominal pain, generalized; Lower abdominal pain; Cervical radiculopathy; Chronic daily headache; LLQ pain; Chronic pain syndrome; Chronic low back pain (1ry area of Pain) (Bilateral) (R>L) w/o sciatica; Chronic hip pain (2ry area of Pain) (Bilateral) (L>R); Chronic knee pain (3ry area of Pain) (Bilateral) (R>L); Chronic neck and back pain (4th area of Pain) (Bilateral) (L>R); Chronic upper back pain (5th area of Pain) (Bilateral); Chronic shoulder pain (6th area of Pain) (Bilateral) (L>R); Cervicogenic headache (7th area of Pain) (Bilateral) (R>L); Cervico-occipital neuralgia (Right); Chronic feet pain (8th area of Pain) (Bilateral) (R>L); Abnormal MRI, lumbar spine (03/20/2021); Abnormal MRI, cervical spine (03/20/2021); Cellulitis of forearm (Left); Traumatic hematoma of left forearm; Lumbar facet joint syndrome; Lumbar facet arthropathy (Multilevel) (Bilateral); Spondylosis without  myelopathy or radiculopathy, cervical region; DDD (degenerative disc disease), cervical; 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 Pain Assessment: Severity of Chronic pain is reported as a 8 /10. Location: Back Lower, Right/radiates down to feet. Onset: More than a month ago. Quality: Constant, Aching. Timing: Constant. Modifying factor(s): ice, laying down. Vitals:  height is 5\' 3"  (1.6 m) and weight is 140 lb (63.5 kg). Her temporal temperature is 98 F (36.7 C). Her blood pressure is 106/44 (abnormal) and her pulse is 77.  BMI: Estimated body mass index is 24.8 kg/m as calculated from the following:   Height as of this encounter: 5\' 3"  (1.6 m).   Weight as of this encounter: 140 lb (63.5 kg). Last encounter: 11/01/2022. Last procedure: 11/24/2022.  Reason for encounter: post-procedure evaluation and assessment.  The patient comes into the clinics at a referring that she is having an increase in the pain after the right lumbar facet RFA. The patient initially reported to the nurse to have attained absolutely no benefit from the procedure.  However, further questioning reveals the low back to have improved however she has developed a flareup of her lower extremity pain.  This pain is going all the way down into the medial aspect of her foot following an L4 dermatomal distribution.  This is actually different from the pain that she was  experiencing when we did the radiofrequency ablation.  Review of the patient's  2022 MRI and the more recent x-ray of the lumbar spine shows significant degenerative disc disease at the L4-5 level with a right-sided disc osteophyte complex and extraforaminal disc protrusion seems to be putting pressure on the right L4 nerve root.  This is compatible with the etiology of the pain that she is experiencing today.  She confirms having pain, weakness, and occasional numbness of the right lower extremity.  She indicates having difficulty sitting, standing, and even driving.  Today I have provided her with copies of the x-rays and the MRI.  Today I also had a conversation with the patient explaining to her that she will continue to have occasional flareups of this pain but at this point we need to repeat her MRI and see how this has progressed.  Today I have offered her to bring her back for a right L4 transforaminal ESI to see if we can toned down some of this pain.  However, she has had this problem for quite some time and it continues to flareup every so often.  At this point we will go ahead and repeat her lumbar MRI and will get a consult with a neurosurgeon for possible surgical alternatives.  The above information was shared with the patient who understood the plan and accepted.  Post-procedure evaluation   Procedure: Lumbar Facet, Medial Branch Radiofrequency Ablation (RFA) #1  Laterality: Right (-RT)  Level: L3, L4, L5, and S1 Medial Branch Level(s). These levels will denervate the L4-5 and L5-S1 lumbar facet joints.  Imaging: Fluoroscopy-guided         Anesthesia: Local anesthesia (1-2% Lidocaine) Anxiolysis: IV Versed 2.0 mg Sedation: Moderate Sedation Fentanyl 1 mL (50 mcg) DOS: 11/24/2022  Performed by: Oswaldo Done, MD  Purpose: Therapeutic/Palliative Indications: Low back pain severe enough to impact quality of life or function. Indications: 1. Lumbar facet joint syndrome   2. Lumbar  facet joint pain   3. Lumbar facet arthropathy (Multilevel) (Bilateral)   4. Spondylosis without myelopathy or radiculopathy, lumbosacral region   5. DJD (degenerative joint disease), lumbosacral   6. DDD (degenerative disc disease), lumbosacral   7. Chronic low back pain (1ry area of Pain) (Bilateral) (R>L) w/o sciatica   8. Abnormal MRI, lumbar spine (03/20/2021)    Alyssa Vega has been dealing with the above chronic pain for longer than three months and has either failed to respond, was unable to tolerate, or simply did not get enough benefit from other more conservative therapies including, but not limited to: 1. Over-the-counter medications 2. Anti-inflammatory medications 3. Muscle relaxants 4. Membrane stabilizers 5. Opioids 6. Physical therapy and/or chiropractic manipulation 7. Modalities (Heat, ice, etc.) 8. Invasive techniques such as nerve blocks. Alyssa Vega has attained more than 50% relief of the pain from a series of diagnostic injections conducted in separate occasions.  Pain Score: Pre-procedure: 6 /10 Post-procedure: 0-No pain/10     Effectiveness:  Initial hour after procedure: 100 %. Subsequent 4-6 hours post-procedure: 100 %. Analgesia past initial 6 hours: 100 %. Ongoing improvement:  Analgesic: The patient initially reported to the nurse to have attained absolutely no benefit from the procedure.  However, further questioning reveals the low back to have improved however she has developed a flareup of her lower extremity pain.  This pain is going all the way down into the medial aspect of her foot following an L4 dermatomal distribution. Function: Back to baseline ROM: Back to baseline  Pharmacotherapy Assessment  Analgesic: None MME/day: 0 mg/day   Monitoring: [place]  PMP: PDMP reviewed during this encounter.       Pharmacotherapy: No side-effects or adverse reactions reported. Compliance: No problems identified. Effectiveness: Clinically acceptable.  No notes  on file  No results found for: "CBDTHCR" No results found for: "D8THCCBX" No results found for: "D9THCCBX"  UDS:  Summary  Date Value Ref Range Status  08/01/2022 Note  Final    Comment:    ==================================================================== Compliance Drug Analysis, Ur ==================================================================== Test                             Result       Flag       Units  Drug Absent but Declared for Prescription Verification   Diclofenac                     Not Detected UNEXPECTED    Diclofenac, as indicated in the declared medication list, is not    always detected even when used as directed.    Naproxen                       Not Detected UNEXPECTED ==================================================================== Test                      Result    Flag   Units      Ref Range   Creatinine              59               mg/dL      >=40 ==================================================================== Declared Medications:  The flagging and interpretation on this report are based on the  following declared medications.  Unexpected results may arise from  inaccuracies in the declared medications.   **Note: The testing scope of this panel includes these medications:   Naproxen (Aleve)   **Note: The testing scope of this panel does not include small to  moderate amounts of these reported medications:   Diclofenac (Voltaren)   **Note: The testing scope of this panel does not include the  following reported medications:   Alendronate (Fosamax)  Chondroitin  Ezetimibe (Zetia)  Glucosamine  Multivitamin  Pantoprazole (Protonix) ==================================================================== For clinical consultation, please call [phone]. ====================================================================       ROS  Constitutional: Denies any fever or chills Gastrointestinal: No reported hemesis,  hematochezia, vomiting, or acute GI distress Musculoskeletal: Denies any acute onset joint swelling, redness, loss of ROM, or weakness Neurological: No reported episodes of acute onset apraxia, aphasia, dysarthria, agnosia, amnesia, paralysis, loss of coordination, or loss of consciousness  Medication Review  diclofenac, glucosamine-chondroitin, multivitamin, and pantoprazole  History Review  Allergy: Alyssa Vega is allergic to duloxetine, atorvastatin, cyclobenzaprine, ibuprofen, melatonin, meloxicam, tizanidine, and venlafaxine. Drug: Alyssa Vega  reports no history of drug use. Alcohol:  reports no history of alcohol use. Tobacco:  reports that she has never smoked. She has never used smokeless tobacco. Social: Alyssa Vega  reports that she has never smoked. She has never used smokeless tobacco. She reports that she does not drink alcohol and does not use drugs. Medical:  has a past medical history of Arthritis. Surgical: Alyssa Vega  has a past surgical history that includes Ovarian cyst removal; Colonoscopy with propofol (N/A, 04/20/2020); and Esophagogastroduodenoscopy (egd) with propofol (N/A, 04/20/2020). Family: family history is not on file.  Laboratory Chemistry Profile   Renal Lab Results  Component Value Date  BUN 8 10/30/2022   CREATININE 0.58 10/30/2022   GFRAA >60 04/05/2020   GFRNONAA >60 10/30/2022    Hepatic Lab Results  Component Value Date   AST 35 10/30/2022   ALT 32 10/30/2022   ALBUMIN 3.7 10/30/2022   ALKPHOS 56 10/30/2022   LIPASE 33 10/30/2022    Electrolytes Lab Results  Component Value Date   NA 135 10/30/2022   K 3.6 10/30/2022   CL 103 10/30/2022   CALCIUM 8.3 (L) 10/30/2022   MG 2.3 08/01/2022    Bone Lab Results  Component Value Date   25OHVITD1 41 08/01/2022   25OHVITD2 4.7 08/01/2022   25OHVITD3 36 08/01/2022    Inflammation (CRP: Acute Phase) (ESR: Chronic Phase) Lab Results  Component Value Date   CRP <1 08/01/2022   ESRSEDRATE 7  08/01/2022         Note: Above Lab results reviewed.  Recent Imaging Review  DG PAIN CLINIC C-ARM 1-60 MIN NO REPORT Fluoro was used, but no Radiologist interpretation will be provided.  Please refer to "NOTES" tab for provider progress note. Note: Reviewed        Physical Exam  General appearance: Well nourished, well developed, and well hydrated. In no apparent acute distress Mental status: Alert, oriented x 3 (person, place, & time)       Respiratory: No evidence of acute respiratory distress Eyes: PERLA Vitals: BP (!) 106/44   Pulse 77   Temp 98 F (36.7 C) (Temporal)   Ht 5\' 3"  (1.6 m)   Wt 140 lb (63.5 kg)   PF 97 L/min   BMI 24.80 kg/m  BMI: Estimated body mass index is 24.8 kg/m as calculated from the following:   Height as of this encounter: 5\' 3"  (1.6 m).   Weight as of this encounter: 140 lb (63.5 kg). Ideal: Ideal body weight: 52.4 kg (115 lb 8.3 oz) Adjusted ideal body weight: 56.8 kg (125 lb 5 oz)  Assessment   Diagnosis Status  1. Chronic radicular pain of lower extremity (Right)   2. Lumbosacral radiculopathy at L4 (Right)   3. Chronic lower extremity pain (Right)   4. Chronic low back pain (Bilateral) (R>L) w/ sciatica (Right)   5. Chronic feet pain (8th area of Pain) (Bilateral) (R>L)   6. Chronic knee pain (3ry area of Pain) (Bilateral) (R>L)   7. Abnormal MRI, lumbar spine (03/20/2021)    Having a Flare-up Worsening Recurring   Updated Problems: Problem  Chronic lower extremity pain (Right)  Lumbosacral radiculopathy at L4 (Right)  Chronic radicular pain of lower extremity (Right)  Chronic low back pain (Bilateral) (R>L) w/ sciatica (Right)    Plan of Care  Problem-specific:  No problem-specific Assessment & Plan notes found for this encounter.  Alyssa Vega is not on any long-term medications.  Pharmacotherapy (Medications Ordered): No orders of the defined types were placed in this encounter.  Orders:  Orders Placed This  Encounter  Procedures   Lumbar Transforaminal Epidural    Standing Status:   Future    Standing Expiration Date:   04/04/2023    Scheduling Instructions:     Laterality: Right-sided     Level(s): L4             Sedation: Patient's choice.     Timeframe: ASAA    Order Specific Question:   Where will this procedure be performed?    Answer:   [facility]   MR LUMBAR SPINE WO CONTRAST  Patient presents with axial pain with possible radicular component. Please assist [place] in identifying specific level(s) and laterality of any additional findings such as: 1. Facet (Zygapophyseal) joint DJD (Hypertrophy, space narrowing, subchondral sclerosis, and/or osteophyte formation) 2. DDD and/or IVDD (Loss of disc height, desiccation, gas patterns, osteophytes, endplate sclerosis, or "Black disc disease") 3. Pars defects 4. Spondylolisthesis, spondylosis, and/or spondyloarthropathies (include Degree/Grade of displacement in mm) (stability) 5. Vertebral body Fractures (acute/chronic) (state percentage of collapse) 6. Demineralization (osteopenia/osteoporotic) 7. Bone pathology 8. Foraminal narrowing  9. Surgical changes 10. Central, Lateral Recess, and/or Foraminal Stenosis (include AP diameter of stenosis in mm) 11. Surgical changes (hardware type, status, and presence of fibrosis) 12. Modic Type Changes (MRI only) 13. IVDD (Disc bulge, protrusion, herniation, extrusion) (Level, laterality, extent)    Standing Status:   Future    Standing Expiration Date:   02/01/2023    Scheduling Instructions:     Please make sure that the patient understands that this needs to be done as soon as possible. Never have the patient do the imaging "just before the next appointment". Inform patient that having the imaging done within the [facility] will expedite the availability of the results and will provide      imaging availability to the requesting physician. In addition inform the patient that the imaging  order has an expiration date and will not be renewed if not done within the active period.    Order Specific Question:   What is the patient's sedation requirement?    Answer:   No Sedation    Order Specific Question:   Does the patient have a pacemaker or implanted devices?    Answer:   No    Order Specific Question:   Preferred imaging location?    Answer:   [facility] (table limit-350lbs)    Order Specific Question:   Call Results- Best Contact Number?    Answer:   [phone] [facility])    Order Specific Question:   Radiology Contrast Protocol - do NOT remove file path    Answer:   [email or web address]   Ambulatory referral to Neurosurgery    Referral Priority:   Routine    Referral Type:   Surgical    Referral Reason:   Specialty Services Required    Referred to Provider:   Venetia Night, MD    Requested Specialty:   Neurosurgery    Number of Visits Requested:   1   Follow-up plan:   Return for [facility]): (R) L4 TFESI #1.      Interventional Therapies  Risk Factors  Considerations:    Most pain described to be from this level down except on right.    Planned  Pending:   Diagnostic/therapeutic right L4 TFESI #1 (done before on 03/19/2021 by Chasnis DO)    Under consideration:   Therapeutic bilateral lumbar facet (L4-5, L5-S1) RFA #1    Completed:   Therapeutic right lumbar L4-5, L5-S1 facet RFA #1 (no steroids) (11/24/2022)  Diagnostic bilateral (L4-5, L5-S1) Lumbar Facet (L3-S1) MBB x2 (10/18/2022) (1st: 100/100/50 x 3 day) (2nd: ...   Completed by other providers:   By Dr. Yves Dill Therapeutic right L4-5 TFESI x2 (03/19/2021) by Merri Ray, DO [facility])  Therapeutic right L5-S1 TFESI x1 (03/19/2021) by Merri Ray, DO [facility])  Therapeutic right C6-7 TFESI x3 (05/19/2020) by Merri Ray, DO [facility])  Therapeutic right C6-7 TFESI x1 (04/30/2021) by Merri Ray, DO [facility])  Therapeutic bilateral IA  steroid knee inj.  x1 (07/30/2021) by Lasandra Beech, PA ([facility])    Therapeutic  Palliative (PRN) options:   None established      Recent Visits Date Type Provider Dept  11/24/22 Procedure visit Delano Metz, MD [facility]  11/01/22 Office Visit Delano Metz, MD [facility]  10/18/22 Procedure visit Delano Metz, MD [facility]  Showing recent visits within past 90 days and meeting all other requirements Today's Visits Date Type Provider Dept  01/02/23 Office Visit Delano Metz, MD [facility]  Showing today's visits and meeting all other requirements Future Appointments Date Type Provider Dept  01/05/23 Appointment Delano Metz, MD [facility]  Showing future appointments within next 90 days and meeting all other requirements  I discussed the assessment and treatment plan with the patient. The patient was provided an opportunity to ask questions and all were answered. The patient agreed with the plan and demonstrated an understanding of the instructions.  Patient advised to call back or seek an in-person evaluation if the symptoms or condition worsens.  Duration of encounter: 31 minutes.  Total time on encounter, as per AMA guidelines included both the face-to-face and non-face-to-face time personally spent by the physician and/or other qualified health care professional(s) on the day of the encounter (includes time in activities that require the physician or other qualified health care professional and does not include time in activities normally performed by clinical staff). Physician's time may include the following activities when performed: Preparing to see the patient (e.g., pre-charting review of records, searching for previously ordered imaging, lab work, and nerve conduction tests) Review of prior analgesic pharmacotherapies. Reviewing PMP Interpreting ordered tests (e.g., lab work, imaging, nerve  conduction tests) Performing post-procedure evaluations, including interpretation of diagnostic procedures Obtaining and/or reviewing separately obtained history Performing a medically appropriate examination and/or evaluation Counseling and educating the patient/family/caregiver Ordering medications, tests, or procedures Referring and communicating with other health care professionals (when not separately reported) Documenting clinical information in the electronic or other health record Independently interpreting results (not separately reported) and communicating results to the patient/ family/caregiver Care coordination (not separately reported)  Note by: Oswaldo Done, MD Date: 01/02/2023; Time: 12:25 PM

## 2023-01-02 ENCOUNTER — Ambulatory Visit: Payer: PPO | Attending: Pain Medicine | Admitting: Pain Medicine

## 2023-01-02 VITALS — BP 106/44 | HR 77 | Temp 98.0°F | Ht 63.0 in | Wt 140.0 lb

## 2023-01-02 DIAGNOSIS — M79671 Pain in right foot: Secondary | ICD-10-CM | POA: Diagnosis not present

## 2023-01-02 DIAGNOSIS — M79604 Pain in right leg: Secondary | ICD-10-CM | POA: Diagnosis not present

## 2023-01-02 DIAGNOSIS — G8929 Other chronic pain: Secondary | ICD-10-CM | POA: Insufficient documentation

## 2023-01-02 DIAGNOSIS — M5410 Radiculopathy, site unspecified: Secondary | ICD-10-CM | POA: Diagnosis not present

## 2023-01-02 DIAGNOSIS — M79672 Pain in left foot: Secondary | ICD-10-CM | POA: Insufficient documentation

## 2023-01-02 DIAGNOSIS — M25562 Pain in left knee: Secondary | ICD-10-CM | POA: Diagnosis not present

## 2023-01-02 DIAGNOSIS — R937 Abnormal findings on diagnostic imaging of other parts of musculoskeletal system: Secondary | ICD-10-CM | POA: Diagnosis not present

## 2023-01-02 DIAGNOSIS — M5417 Radiculopathy, lumbosacral region: Secondary | ICD-10-CM | POA: Insufficient documentation

## 2023-01-02 DIAGNOSIS — M5441 Lumbago with sciatica, right side: Secondary | ICD-10-CM | POA: Insufficient documentation

## 2023-01-02 DIAGNOSIS — M25561 Pain in right knee: Secondary | ICD-10-CM | POA: Diagnosis not present

## 2023-01-02 NOTE — Patient Instructions (Signed)

## 2023-01-05 ENCOUNTER — Ambulatory Visit: Payer: PPO | Admitting: Pain Medicine

## 2023-01-16 ENCOUNTER — Ambulatory Visit
Admission: RE | Admit: 2023-01-16 | Discharge: 2023-01-16 | Disposition: A | Discharge status: Home / Self Care | Payer: PPO | Source: Ambulatory Visit | Attending: Pain Medicine | Admitting: Pain Medicine

## 2023-01-16 DIAGNOSIS — M79671 Pain in right foot: Secondary | ICD-10-CM | POA: Insufficient documentation

## 2023-01-16 DIAGNOSIS — M25562 Pain in left knee: Secondary | ICD-10-CM | POA: Insufficient documentation

## 2023-01-16 DIAGNOSIS — M5410 Radiculopathy, site unspecified: Secondary | ICD-10-CM | POA: Insufficient documentation

## 2023-01-16 DIAGNOSIS — M5126 Other intervertebral disc displacement, lumbar region: Secondary | ICD-10-CM | POA: Diagnosis not present

## 2023-01-16 DIAGNOSIS — M5441 Lumbago with sciatica, right side: Secondary | ICD-10-CM | POA: Insufficient documentation

## 2023-01-16 DIAGNOSIS — M48061 Spinal stenosis, lumbar region without neurogenic claudication: Secondary | ICD-10-CM | POA: Diagnosis not present

## 2023-01-16 DIAGNOSIS — M5417 Radiculopathy, lumbosacral region: Secondary | ICD-10-CM | POA: Diagnosis not present

## 2023-01-16 DIAGNOSIS — G8929 Other chronic pain: Secondary | ICD-10-CM | POA: Diagnosis not present

## 2023-01-16 DIAGNOSIS — M25561 Pain in right knee: Secondary | ICD-10-CM | POA: Insufficient documentation

## 2023-01-16 DIAGNOSIS — M79604 Pain in right leg: Secondary | ICD-10-CM | POA: Diagnosis not present

## 2023-01-16 DIAGNOSIS — M47816 Spondylosis without myelopathy or radiculopathy, lumbar region: Secondary | ICD-10-CM | POA: Diagnosis not present

## 2023-01-16 DIAGNOSIS — M79672 Pain in left foot: Secondary | ICD-10-CM | POA: Insufficient documentation

## 2023-01-16 DIAGNOSIS — R937 Abnormal findings on diagnostic imaging of other parts of musculoskeletal system: Secondary | ICD-10-CM

## 2023-01-16 DIAGNOSIS — M5136 Other intervertebral disc degeneration, lumbar region: Secondary | ICD-10-CM | POA: Diagnosis not present

## 2023-02-02 ENCOUNTER — Ambulatory Visit: Payer: PPO | Admitting: Neurosurgery

## 2023-02-02 NOTE — Progress Notes (Unsigned)
Referring Physician:  Delano Metz, MD [address] [facility] [address] [place],  [place] [address]  Primary Physician:  Marisue Ivan, MD  History of Present Illness: 02/07/2023 [name] is here today with a chief complaint of low back pain that radiates into the right leg.  She has pain primarily in her right buttock.  It has been ongoing for many years and has been getting worse.  Her pain is made worse by sitting and bending over.  Driving increases her leg pain.  She is also having pain around her right knee.  Laying on the floor sometimes helps.  Her pain is currently 7 out of 10 but sometimes is a little bit worse.  Bowel/Bladder Dysfunction: none  Conservative measures:  Physical therapy: no recent therapy Multimodal medical therapy including regular antiinflammatories: diclofenac, hydrocodone, tylenol Injections: has received epidural steroid injections 11/24/22: Right L3, L4, L5, S1 medial branch block (Dr. Laban Emperor) 10/18/22: Bilateral L3, L4, L5, S1 medial branch block (Dr. Laban Emperor) 09/15/22: Bilateral L3, L4, L5,S1 medial branch block (Dr. Laban Emperor) 04/15/2022: Bilateral L5-S1 transforaminal ESI  03/19/2021: Right L4-5 and right L5-S1 transforaminal ESI (no relief) 05/04/2020: Right L4-5 transforaminal ESI (mild relief)   Past Surgery: denies  Alyssa Vega has no symptoms of cervical myelopathy.  The symptoms are causing a significant impact on the patient's life.   I have utilized the care everywhere function in epic to review the outside records available from external health systems.  Review of Systems:  A 10 point review of systems is negative, except for the pertinent positives and negatives detailed in the HPI.  Past Medical History: Past Medical History:  Diagnosis Date   Arthritis     Past Surgical History: Past Surgical History:  Procedure Laterality Date   COLONOSCOPY WITH PROPOFOL N/A 04/20/2020   Procedure: COLONOSCOPY WITH  PROPOFOL;  Surgeon: Regis Bill, MD;  Location: [facility] ENDOSCOPY;  Service: Endoscopy;  Laterality: N/A;   ESOPHAGOGASTRODUODENOSCOPY (EGD) WITH PROPOFOL N/A 04/20/2020   Procedure: ESOPHAGOGASTRODUODENOSCOPY (EGD) WITH PROPOFOL;  Surgeon: Regis Bill, MD;  Location: [facility] ENDOSCOPY;  Service: Endoscopy;  Laterality: N/A;   OVARIAN CYST REMOVAL      Allergies: Allergies as of 02/07/2023 - Review Complete 02/07/2023  Allergen Reaction Noted   Duloxetine  04/05/2020   Atorvastatin Itching 12/21/2016   Cyclobenzaprine Other (See Comments) 12/31/2015   Ibuprofen Other (See Comments) 06/06/2017   Melatonin Other (See Comments) 03/20/2018   Meloxicam Other (See Comments) 12/31/2015   Tizanidine Other (See Comments) 12/04/2018   Venlafaxine Other (See Comments) 05/03/2019    Medications:  Current Outpatient Medications:    diclofenac (VOLTAREN) 75 MG EC tablet, Take 75 mg by mouth 2 (two) times daily., Disp: , Rfl:    glucosamine-chondroitin 500-400 MG tablet, Take 1 tablet by mouth daily., Disp: , Rfl:    Multiple Vitamin (MULTIVITAMIN) tablet, Take 1 tablet by mouth daily., Disp: , Rfl:    pantoprazole (PROTONIX) 20 MG tablet, Take 20 mg by mouth daily., Disp: , Rfl:   Social History: Social History   Tobacco Use   Smoking status: Never   Smokeless tobacco: Never  Vaping Use   Vaping status: Never Used  Substance Use Topics   Alcohol use: No   Drug use: No    Family Medical History: Family History  Problem Relation Age of Onset   Breast cancer Neg Hx     Physical Examination: Vitals:   02/07/23 1510  BP: 116/66    General:  Patient is in no apparent distress. Attention to examination is appropriate.  Neck:   Supple.  Full range of motion.  Respiratory: Patient is breathing without any difficulty.   NEUROLOGICAL:     Awake, alert, oriented to person, place, and time.  Speech is clear and fluent.   Cranial Nerves: Pupils equal round and reactive to  light.  Facial tone is symmetric.  Facial sensation is symmetric. Shoulder shrug is symmetric. Tongue protrusion is midline.  There is no pronator drift.  Strength: Side Biceps Triceps Deltoid Interossei Grip Wrist Ext. Wrist Flex.  R 5 5 5 5 5 5 5   L 5 5 5 5 5 5 5    Side Iliopsoas Quads Hamstring PF DF EHL  R 5 5 5 5 5 5   L 5 5 5 5 5 5    Reflexes are 1+ and symmetric at the biceps, triceps, brachioradialis, patella and achilles.   Hoffman's is absent.   Bilateral upper and lower extremity sensation is intact to light touch.    No evidence of dysmetria noted.  Gait is normal.    She has some tenderness to palpation around her right knee joint.   Medical Decision Making  Imaging: MRI L spine 01/23/2023 Disc levels:   Disc spaces: Degenerative disease with disc desiccation at L2-3, L3-4, L4-5 and L5-S1 with mild disc height loss at L5-S1.   T12-L1: No significant disc bulge. Mild bilateral facet arthropathy. No foraminal or central canal stenosis.   L1-L2: No significant disc bulge. Mild bilateral facet arthropathy. No foraminal or central canal stenosis.   L2-L3: Small shallow left foraminal disc protrusion. No foraminal or central canal stenosis.   L3-L4: Mild broad-based disc bulge. Mild bilateral facet arthropathy. No foraminal or central canal stenosis.   L4-L5: Broad-based disc bulge with a right lateral disc osteophyte complex and small broad left foraminal disc protrusion. Mild right foraminal stenosis. No left foraminal stenosis. Mild bilateral facet arthropathy.   L5-S1: Mild broad-based disc bulge. Mild bilateral facet arthropathy. No foraminal or central canal stenosis.   IMPRESSION: 1. At L4-5 there is a broad-based disc bulge with a broad left foraminal disc protrusion. Mild right foraminal stenosis. No left foraminal stenosis. Mild bilateral facet arthropathy. 2. At L3-4 there is a mild broad-based disc bulge. Mild bilateral facet arthropathy. 3. At  L2-3 there is a small shallow left foraminal disc protrusion. 4. No acute osseous injury of the lumbar spine.     Electronically Signed   By: Elige Ko M.D.   On: 01/23/2023 08:58  I have personally reviewed the images and agree with the above interpretation.  Assessment and Plan: Alyssa Vega is a pleasant 67 y.o. female with chronic low back pain with right-sided sciatica.  This is causing neuropathic pain in her right leg.  She has no obvious target for surgical intervention.  As such, I would recommend she consider spinal cord stimulator evaluation for her neuropathic pain.  I do not think she is a surgical candidate.  If she has a successful spinal cord stimulator evaluation, would be happy to place a permanent device.  I will communicate this with Dr. Laban Emperor.  I spent a total of 40 minutes in this patient's care today. This time was spent reviewing pertinent records including imaging studies, obtaining and confirming history, performing a directed evaluation, formulating and discussing my recommendations, and documenting the visit within the medical record.     Thank you for involving me in the care of this patient.  Dene Nazir K. Myer Haff MD, [facility] Neurosurgery

## 2023-02-07 ENCOUNTER — Other Ambulatory Visit (INDEPENDENT_AMBULATORY_CARE_PROVIDER_SITE_OTHER): Payer: Self-pay | Admitting: Vascular Surgery

## 2023-02-07 ENCOUNTER — Encounter: Payer: Self-pay | Admitting: Neurosurgery

## 2023-02-07 ENCOUNTER — Ambulatory Visit (INDEPENDENT_AMBULATORY_CARE_PROVIDER_SITE_OTHER): Payer: PPO | Admitting: Neurosurgery

## 2023-02-07 VITALS — BP 116/66 | Ht 63.0 in | Wt 142.8 lb

## 2023-02-07 DIAGNOSIS — G8929 Other chronic pain: Secondary | ICD-10-CM | POA: Diagnosis not present

## 2023-02-07 DIAGNOSIS — M5441 Lumbago with sciatica, right side: Secondary | ICD-10-CM | POA: Diagnosis not present

## 2023-02-07 DIAGNOSIS — I83813 Varicose veins of bilateral lower extremities with pain: Secondary | ICD-10-CM

## 2023-02-07 DIAGNOSIS — M792 Neuralgia and neuritis, unspecified: Secondary | ICD-10-CM

## 2023-02-09 ENCOUNTER — Encounter (INDEPENDENT_AMBULATORY_CARE_PROVIDER_SITE_OTHER): Payer: Self-pay

## 2023-02-09 ENCOUNTER — Encounter (INDEPENDENT_AMBULATORY_CARE_PROVIDER_SITE_OTHER): Payer: Self-pay | Admitting: Vascular Surgery

## 2023-03-13 ENCOUNTER — Other Ambulatory Visit: Payer: Self-pay | Admitting: Pain Medicine

## 2023-03-15 ENCOUNTER — Encounter (INDEPENDENT_AMBULATORY_CARE_PROVIDER_SITE_OTHER): Payer: Self-pay | Admitting: Nurse Practitioner

## 2023-03-15 ENCOUNTER — Ambulatory Visit (INDEPENDENT_AMBULATORY_CARE_PROVIDER_SITE_OTHER): Payer: PPO

## 2023-03-15 ENCOUNTER — Ambulatory Visit (INDEPENDENT_AMBULATORY_CARE_PROVIDER_SITE_OTHER): Payer: PPO | Admitting: Nurse Practitioner

## 2023-03-15 DIAGNOSIS — M5441 Lumbago with sciatica, right side: Secondary | ICD-10-CM

## 2023-03-15 DIAGNOSIS — E7800 Pure hypercholesterolemia, unspecified: Secondary | ICD-10-CM | POA: Diagnosis not present

## 2023-03-15 DIAGNOSIS — I83813 Varicose veins of bilateral lower extremities with pain: Secondary | ICD-10-CM | POA: Diagnosis not present

## 2023-03-15 DIAGNOSIS — G8929 Other chronic pain: Secondary | ICD-10-CM | POA: Diagnosis not present

## 2023-03-16 ENCOUNTER — Encounter (INDEPENDENT_AMBULATORY_CARE_PROVIDER_SITE_OTHER): Payer: Self-pay | Admitting: Nurse Practitioner

## 2023-03-16 NOTE — Progress Notes (Signed)
Subjective:    Patient ID: Alyssa Vega, female    DOB: 1956/01/19, 67 y.o.   MRN: [id number] Chief Complaint  Patient presents with   New Patient (Initial Visit)    Ref [place] consult ble varicose veins    The patient is seen for evaluation of symptomatic varicose veins. The patient relates burning and stinging which worsened steadily throughout the course of the day, particularly with standing. The patient also notes an aching and throbbing pain over the varicosities, particularly with prolonged dependent positions. The symptoms are significantly improved with elevation.  The patient also notes that during hot weather the symptoms are greatly intensified. The patient states the pain from the varicose veins interferes with work, daily exercise, shopping and household maintenance. At this point, the symptoms are persistent and severe enough that they're having a negative impact on lifestyle and are interfering with daily activities.  She notes that it is her right lower extremity that is drastically affected.  She also has a history of ongoing right leg pain due to sciatica.  There is no history of DVT, PE or superficial thrombophlebitis. There is no history of ulceration or hemorrhage. The patient denies a significant family history of varicose veins.  The patient has worn graduated compression in the past. At the present time the patient has been using over-the-counter analgesics. There is no history of prior surgical intervention or sclerotherapy  Today noninvasive study showed no evidence of DVT or superficial thrombophlebitis in the bilateral lower extremities.  There is evidence of deep venous insufficiency in the right lower extremity.  Evidence of reflux in the great saphenous vein extending from the saphenofemoral junction.  0 proximal calf.  As well as a small saphenous vein.  Left lower extremity varices scattered areas of superficial reflux.      Review of Systems   Cardiovascular:  Positive for leg swelling.  All other systems reviewed and are negative.      Objective:   Physical Exam Vitals reviewed.  HENT:     Left Ear: There is impacted cerumen.  Cardiovascular:     Rate and Rhythm: Normal rate.     Pulses: Normal pulses.  Pulmonary:     Effort: Pulmonary effort is normal.  Musculoskeletal:     Right lower leg: Edema present.  Skin:    General: Skin is warm and dry.  Neurological:     Mental Status: She is alert and oriented to person, place, and time.  Psychiatric:        Mood and Affect: Mood normal.        Behavior: Behavior normal.        Thought Content: Thought content normal.        Judgment: Judgment normal.     BP 121/68 (BP Location: Left Arm)   Pulse 71   Resp 16   Wt 144 lb (65.3 kg)   BMI 25.51 kg/m   Past Medical History:  Diagnosis Date   Arthritis     Social History   Socioeconomic History   Marital status: Divorced    Spouse name: Not on file   Number of children: Not on file   Years of education: Not on file   Highest education level: Not on file  Occupational History   Not on file  Tobacco Use   Smoking status: Never   Smokeless tobacco: Never  Vaping Use   Vaping status: Never Used  Substance and Sexual Activity   Alcohol use: No  Drug use: No   Sexual activity: Not on file  Other Topics Concern   Not on file  Social History Narrative   Not on file   Social Determinants of Health   Financial Resource Strain: Not on file  Food Insecurity: Not on file  Transportation Needs: Not on file  Physical Activity: Not on file  Stress: Not on file  Social Connections: Not on file  Intimate Partner Violence: Not on file    Past Surgical History:  Procedure Laterality Date   COLONOSCOPY WITH PROPOFOL N/A 04/20/2020   Procedure: COLONOSCOPY WITH PROPOFOL;  Surgeon: Regis Bill, MD;  Location: [facility] ENDOSCOPY;  Service: Endoscopy;  Laterality: N/A;   ESOPHAGOGASTRODUODENOSCOPY  (EGD) WITH PROPOFOL N/A 04/20/2020   Procedure: ESOPHAGOGASTRODUODENOSCOPY (EGD) WITH PROPOFOL;  Surgeon: Regis Bill, MD;  Location: [facility] ENDOSCOPY;  Service: Endoscopy;  Laterality: N/A;   OVARIAN CYST REMOVAL      Family History  Problem Relation Age of Onset   Breast cancer Neg Hx     Allergies  Allergen Reactions   Duloxetine     hyponatremia   Atorvastatin Itching   Cyclobenzaprine Other (See Comments)    Sores in mouth/throat   Ibuprofen Other (See Comments)    Mouth sores   Melatonin Other (See Comments)    Stomach pain   Meloxicam Other (See Comments)    Sores/ulcerations mouth/tongue/throat   Tizanidine Other (See Comments)    Nightmares, insomnia    Venlafaxine Other (See Comments)    Did not feel well at all, very lethargic        Latest Ref Rng & Units 10/30/2022   11:49 AM 09/05/2022    5:06 PM 02/11/2022    6:27 PM  CBC  WBC 4.0 - 10.5 K/uL 8.6  5.3  5.6   Hemoglobin 12.0 - 15.0 g/dL 16.1  09.6  04.5   Hematocrit 36.0 - 46.0 % 46.3  39.1  41.7   Platelets 150 - 400 K/uL 227  244  245       CMP     Component Value Date/Time   NA 135 10/30/2022 1149   K 3.6 10/30/2022 1149   CL 103 10/30/2022 1149   CO2 25 10/30/2022 1149   GLUCOSE 89 10/30/2022 1149   BUN 8 10/30/2022 1149   CREATININE 0.58 10/30/2022 1149   CALCIUM 8.3 (L) 10/30/2022 1149   PROT 6.4 (L) 10/30/2022 1149   ALBUMIN 3.7 10/30/2022 1149   AST 35 10/30/2022 1149   ALT 32 10/30/2022 1149   ALKPHOS 56 10/30/2022 1149   BILITOT 0.5 10/30/2022 1149   GFRNONAA >60 10/30/2022 1149     No results found.     Assessment & Plan:   1. Varicose veins of both lower extremities with pain Recommend  I have reviewed my   discussion with the patient regarding  leg pain in association with varicose veins, venous insufficiency and leg swelling.  I discussed why these conditions cause symptoms. The patient will continue wearing graduated compression stockings class 1 on a daily basis,  beginning first thing in the morning and removing them in the evening.  The patient is CEAP [id number].  The patient has been wearing compression for more than 12 weeks with no or little benefit.  The patient has been exercising daily for more than 12 weeks. The patient has been elevating and taking OTC pain medications for more than 12 weeks.  None of these have have eliminated the pain in the lower extremities  nor the discomfort which may very well be related to venous congestion.    In addition, behavioral modification including elevation during the day was again stressed and this will continue.  The patient has utilized over the counter pain medications and has been exercising.  However, at this time conservative therapy has not alleviated the patient's symptoms of leg pain and swelling  Recommend: laser ablation of the right great  and small saphenous veins to eliminate the symptoms of pain and swelling of the lower extremities caused by the severe superficial venous reflux disease.  We did also discuss at length that the extremity pain symptoms in some respects are somewhat atypical and that moving forward with laser ablation may not eliminate all of the lower extremity pain.  However, I do believe that laser ablation would offer significant improvement and therefore is a realistic and viable option.  2. Chronic low back pain (Bilateral) (R>L) w/ sciatica (Right) As noted above, we discussed some of the aspects of her pain may be related to her sciatica.  Is also possible that the significant reflux is worsening the underlying issue she has due to her sciatica  3. Pure hypercholesterolemia Continue statin as ordered and reviewed, no changes at this time   Current Outpatient Medications on File Prior to Visit  Medication Sig Dispense Refill   acetaminophen (TYLENOL) 500 MG tablet Take 500 mg by mouth every 6 (six) hours as needed.     diclofenac (VOLTAREN) 75 MG EC tablet Take 75 mg by  mouth as needed.     glucosamine-chondroitin 500-400 MG tablet Take 1 tablet by mouth daily.     Multiple Vitamin (MULTIVITAMIN) tablet Take 1 tablet by mouth daily.     pantoprazole (PROTONIX) 20 MG tablet Take 20 mg by mouth daily.     No current facility-administered medications on file prior to visit.    There are no Patient Instructions on file for this visit. No follow-ups on file.   Georgiana Spinner, NP

## 2023-03-17 DIAGNOSIS — E7800 Pure hypercholesterolemia, unspecified: Secondary | ICD-10-CM | POA: Diagnosis not present

## 2023-03-24 DIAGNOSIS — Z8673 Personal history of transient ischemic attack (TIA), and cerebral infarction without residual deficits: Secondary | ICD-10-CM | POA: Diagnosis not present

## 2023-03-24 DIAGNOSIS — K219 Gastro-esophageal reflux disease without esophagitis: Secondary | ICD-10-CM | POA: Diagnosis not present

## 2023-03-24 DIAGNOSIS — M810 Age-related osteoporosis without current pathological fracture: Secondary | ICD-10-CM | POA: Diagnosis not present

## 2023-03-24 DIAGNOSIS — E7800 Pure hypercholesterolemia, unspecified: Secondary | ICD-10-CM | POA: Diagnosis not present

## 2023-03-27 NOTE — Progress Notes (Signed)
PROVIDER NOTE: Information contained herein reflects review and annotations entered in association with encounter. Interpretation of such information and data should be left to medically-trained personnel. Information provided to patient can be located elsewhere in the medical record under "Patient Instructions". Document created using STT-dictation technology, any transcriptional errors that may result from process are unintentional.    Patient: Alyssa Vega  Service Category: E/M  Provider: Oswaldo Done, MD  DOB: [birth year]  DOS: 03/29/2023  Referring Provider: Marisue Ivan, MD  MRN: [id number]  Specialty: Interventional Pain Management  PCP: Marisue Ivan, MD  Type: Established Patient  Setting: Ambulatory outpatient    Location: Office  Delivery: Face-to-face     HPI  Alyssa Vega, a 67 y.o. year old female, is here today because of her Chronic pain of right lower extremity [M79.604, G89.29]. Alyssa Vega primary complain today is Pain (Bilateral pain; shoulders down arms and  down sides of body and legs to soles of feet)  Pertinent problems: Alyssa Vega has Abdominal pain, generalized; Lower abdominal pain; Cervical radiculopathy; Chronic daily headache; LLQ pain; Chronic pain syndrome; Chronic low back pain (1ry area of Pain) (Bilateral) (R>L) w/o sciatica; Chronic hip pain (2ry area of Pain) (Bilateral) (L>R); Chronic knee pain (3ry area of Pain) (Bilateral) (R>L); Chronic neck and back pain (4th area of Pain) (Bilateral) (L>R); Chronic upper back pain (5th area of Pain) (Bilateral); Chronic shoulder pain (6th area of Pain) (Bilateral) (L>R); Cervicogenic headache (7th area of Pain) (Bilateral) (R>L); Cervico-occipital neuralgia (Right); Chronic feet pain (8th area of Pain) (Bilateral) (R>L); Abnormal MRI, lumbar spine (03/20/2021 & 01/23/2023); Abnormal MRI, cervical spine (03/20/2021); Cellulitis of forearm (Left); Traumatic hematoma of left forearm; Lumbar facet joint syndrome;  Lumbar facet arthropathy (Multilevel) (Bilateral); Spondylosis without myelopathy or radiculopathy, cervical region; DDD (degenerative disc disease), cervical; 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 Pain Assessment: Severity of Chronic pain is reported as a 8 /10. Location: Other (Comment) (shoulder pain) Right, Left/Bilateral pain; shoulders down arms and  down sides of body and legs to soles of feet. Onset: More than a month ago. Quality: Constant, Stabbing. Timing: Constant. Modifying factor(s): exercises, epsom salt baths, heat. Vitals:  height is 5' (1.524 m) and weight is 145 lb (65.8 kg). Her temperature is 97.7 F (36.5 C). Her blood pressure is 97/48 (abnormal) and her pulse is 85. Her respiration is 16 and oxygen saturation is 96%.  BMI: Estimated body mass index is 28.32 kg/m as calculated from the following:   Height as of this encounter: 5' (1.524 m).   Weight as of this encounter: 145 lb (65.8 kg). Last encounter: 01/02/2023. Last procedure: 11/24/2022.  Reason for encounter: evaluation of worsening, or previously known (established) problem.  The patient returns today after having completed her lumbar MRI and having seen the neurosurgeon.  The evaluation by neurosurgery indicates the patient to probably be a better candidate for neuromodulation by way of a spinal cord stimulator.  Today I have  provided the patient with information regarding the device on the trial.  I have also informed the  patient the need to have a medical psychology evaluation before permanent implant.  Today we will interrogate her referral for that implant evaluation.  (01/23/2023) LUMBAR MRI IMPRESSION: 1. At L4-5 there is a broad-based disc bulge with a broad left foraminal disc protrusion. Mild right foraminal stenosis. No left foraminal stenosis. Mild bilateral facet arthropathy. 2. At L3-4 there is a mild broad-based disc bulge. Mild bilateral facet arthropathy. 3. At L2-3 there is a small shallow left foraminal disc protrusion. 4. No acute osseous injury of the lumbar spine.  (01/02/2023) right L4 TFESI (NO STEROIDS) ordered.  In addition to the above, the patient indicates having knee pain bilaterally.  Today she presents with some painful range of motion and x-rays indicate the patient to have primary osteoarthritis of both knees.  Today we went over the possible options and she has decided to proceed with the viscosupplementation therapy.  The patient will be scheduled to return for a bilateral intra-articular Monovisc treatment.  The patient was informed of the plan which she understood and accepted.  Pharmacotherapy Assessment  Analgesic: None MME/day: 0 mg/day   Monitoring: [place] PMP: PDMP reviewed during this encounter.       Pharmacotherapy: No side-effects or adverse reactions reported. Compliance: No problems identified. Effectiveness: Clinically acceptable.  Nonah Mattes, RN  03/29/2023  3:13 PM  Sign when Signing Visit Safety precautions to be maintained throughout the outpatient stay will include: orient to surroundings, keep bed in low position, maintain call bell within reach at all times, provide assistance with transfer out of bed and ambulation.       No results found for: "CBDTHCR" No results found for: "D8THCCBX" No results found for: "D9THCCBX"  UDS:  Summary  Date Value Ref  Range Status  08/01/2022 Note  Final    Comment:    ==================================================================== Compliance Drug Analysis, Ur ==================================================================== Test                             Result       Flag       Units  Drug Absent but Declared for Prescription Verification   Diclofenac                     Not Detected UNEXPECTED    Diclofenac, as indicated in the declared medication list, is not    always detected even when used as directed.    Naproxen                       Not Detected UNEXPECTED ==================================================================== Test                      Result    Flag   Units      Ref Range   Creatinine              59               mg/dL      >=16 ==================================================================== Declared Medications:  The flagging and interpretation on this report are based on the  following declared medications.  Unexpected results may arise from  inaccuracies in the declared medications.   **Note: The testing scope of this panel includes these medications:   Naproxen (Aleve)   **Note: The testing scope of this panel does not include small to  moderate amounts of these reported medications:   Diclofenac (Voltaren)   **Note: The  testing scope of this panel does not include the  following reported medications:   Alendronate (Fosamax)  Chondroitin  Ezetimibe (Zetia)  Glucosamine  Multivitamin  Pantoprazole (Protonix) ==================================================================== For clinical consultation, please call [phone]. ====================================================================       ROS  Constitutional: Denies any fever or chills Gastrointestinal: No reported hemesis, hematochezia, vomiting, or acute GI distress Musculoskeletal: Denies any acute onset joint swelling, redness, loss of ROM, or weakness Neurological:  No reported episodes of acute onset apraxia, aphasia, dysarthria, agnosia, amnesia, paralysis, loss of coordination, or loss of consciousness  Medication Review  acetaminophen, diclofenac, glucosamine-chondroitin, multivitamin, and pantoprazole  History Review  Allergy: Alyssa Vega is allergic to duloxetine, atorvastatin, cyclobenzaprine, ibuprofen, melatonin, meloxicam, tizanidine, and venlafaxine. Drug: Alyssa Vega  reports no history of drug use. Alcohol:  reports no history of alcohol use. Tobacco:  reports that she has never smoked. She has never used smokeless tobacco. Social: Alyssa Vega  reports that she has never smoked. She has never used smokeless tobacco. She reports that she does not drink alcohol and does not use drugs. Medical:  has a past medical history of Arthritis. Surgical: Alyssa Vega  has a past surgical history that includes Ovarian cyst removal; Colonoscopy with propofol (N/A, 04/20/2020); and Esophagogastroduodenoscopy (egd) with propofol (N/A, 04/20/2020). Family: family history is not on file.  Laboratory Chemistry Profile   Renal Lab Results  Component Value Date   BUN 8 10/30/2022   CREATININE 0.58 10/30/2022   GFRAA >60 04/05/2020   GFRNONAA >60 10/30/2022    Hepatic Lab Results  Component Value Date   AST 35 10/30/2022   ALT 32 10/30/2022   ALBUMIN 3.7 10/30/2022   ALKPHOS 56 10/30/2022   LIPASE 33 10/30/2022    Electrolytes Lab Results  Component Value Date   NA 135 10/30/2022   K 3.6 10/30/2022   CL 103 10/30/2022   CALCIUM 8.3 (L) 10/30/2022   MG 2.3 08/01/2022    Bone Lab Results  Component Value Date   25OHVITD1 41 08/01/2022   25OHVITD2 4.7 08/01/2022   25OHVITD3 36 08/01/2022    Inflammation (CRP: Acute Phase) (ESR: Chronic Phase) Lab Results  Component Value Date   CRP <1 08/01/2022   ESRSEDRATE 7 08/01/2022         Note: Above Lab results reviewed.  Recent Imaging Review  VAS [place] LOWER EXTREMITY VENOUS REFLUX  Lower Venous  Reflux Study  Patient Name:  GABBIE MUNDT Fontanella  Date of Exam:   03/15/2023 Medical Rec #: [id number]       Accession #:    [id number] Date of Birth: 1956-03-19       Patient Gender: F Patient Age:   59 years Exam Location:  [facility] Procedure:      VAS [place] LOWER EXTREMITY VENOUS REFLUX Referring Phys: Levora Dredge  --------------------------------------------------------------------------------   Indications: Pain, Swelling, and Edema. Other Indications: Right leg hurts worse than left.  Performing Technologist: Hardie Lora RVT    Examination Guidelines: A complete evaluation includes B-mode imaging, spectral Doppler, color Doppler, and power Doppler as needed of all accessible portions of each vessel. Bilateral testing is considered an integral part of a complete examination. Limited examinations for reoccurring indications may be performed as noted. The reflux portion of the exam is performed with the patient in reverse Trendelenburg. Significant venous reflux is defined as >500 ms in the superficial venous system, and >1 second in the deep venous system.    Venous Reflux Times +--------------+---------+------+-----------+------------+--------+ RIGHT  Reflux NoRefluxReflux TimeDiameter cmsComments                         Yes                                  +--------------+---------+------+-----------+------------+--------+ CFV                     yes   >1 second                      +--------------+---------+------+-----------+------------+--------+ FV prox       no                                             +--------------+---------+------+-----------+------------+--------+ FV mid        no                                             +--------------+---------+------+-----------+------------+--------+ FV dist       no                                              +--------------+---------+------+-----------+------------+--------+ Popliteal     no                                             +--------------+---------+------+-----------+------------+--------+ GSV at SFJ              yes    >500 ms      0.47             +--------------+---------+------+-----------+------------+--------+ GSV prox thigh          yes    >500 ms      0.51             +--------------+---------+------+-----------+------------+--------+ GSV mid thigh           yes    >500 ms      0.56             +--------------+---------+------+-----------+------------+--------+ GSV dist thigh          yes    >500 ms      0.53             +--------------+---------+------+-----------+------------+--------+ GSV at knee             yes    >500 ms      0.52             +--------------+---------+------+-----------+------------+--------+ GSV prox calf           yes    >500 ms      0.58             +--------------+---------+------+-----------+------------+--------+ SSV Pop Fossa           yes    >500 ms      0.29             +--------------+---------+------+-----------+------------+--------+ SSV prox calf  yes    >500 ms      0.19             +--------------+---------+------+-----------+------------+--------+ SSV mid calf  no        yes                 0.20             +--------------+---------+------+-----------+------------+--------+    +--------------+---------+------+-----------+------------+--------+ LEFT          Reflux NoRefluxReflux TimeDiameter cmsComments                         Yes                                  +--------------+---------+------+-----------+------------+--------+ CFV           no                                             +--------------+---------+------+-----------+------------+--------+ FV prox       no                                              +--------------+---------+------+-----------+------------+--------+ FV mid        no                                             +--------------+---------+------+-----------+------------+--------+ FV dist       no                                             +--------------+---------+------+-----------+------------+--------+ Popliteal     no                                             +--------------+---------+------+-----------+------------+--------+ GSV at [facility]    no                            0.40             +--------------+---------+------+-----------+------------+--------+ GSV prox thighno                            0.33             +--------------+---------+------+-----------+------------+--------+ GSV mid thigh           yes    >500 ms      0.31             +--------------+---------+------+-----------+------------+--------+ GSV dist thighno                            0.31             +--------------+---------+------+-----------+------------+--------+ GSV at knee  yes    >500 ms      0.27             +--------------+---------+------+-----------+------------+--------+ GSV prox calf           yes    >500 ms      0.34             +--------------+---------+------+-----------+------------+--------+ SSV Pop Fossa no                            0.35             +--------------+---------+------+-----------+------------+--------+ SSV prox calf no                            0.21             +--------------+---------+------+-----------+------------+--------+ SSV mid calf  no                            0.23             +--------------+---------+------+-----------+------------+--------+        Summary: Right: - No evidence of deep vein thrombosis seen in the right lower extremity, from the common femoral through the popliteal veins. - No evidence of superficial venous thrombosis in the right  lower extremity. - Venous reflux is noted in the right common femoral vein. - Venous reflux is noted in the right sapheno-femoral junction. - Venous reflux is noted in the right greater saphenous vein in the thigh. - Venous reflux is noted in the right greater saphenous vein in the calf. - Venous reflux is noted in the right short saphenous vein.   Left: - No evidence of deep vein thrombosis seen in the left lower extremity, from the common femoral through the popliteal veins. - No evidence of superficial venous thrombosis in the left lower extremity. - No evidence of superficial venous reflux seen in the left short saphenous vein. - Venous reflux is noted in the left greater saphenous vein in the thigh. - Venous reflux is noted in the left greater saphenous vein in the calf.   *See table(s) above for measurements and observations.  Electronically signed by Levora Dredge MD on 03/23/2023 at 8:31:53 AM.      Final   Note: Reviewed        Physical Exam  General appearance: Well nourished, well developed, and well hydrated. In no apparent acute distress Mental status: Alert, oriented x 3 (person, place, & time)       Respiratory: No evidence of acute respiratory distress Eyes: PERLA Vitals: BP (!) 97/48   Pulse 85   Temp 97.7 F (36.5 C)   Resp 16   Ht 5' (1.524 m)   Wt 145 lb (65.8 kg)   SpO2 96%   BMI 28.32 kg/m  BMI: Estimated body mass index is 28.32 kg/m as calculated from the following:   Height as of this encounter: 5' (1.524 m).   Weight as of this encounter: 145 lb (65.8 kg). Ideal: Ideal body weight: 45.5 kg (100 lb 4.9 oz) Adjusted ideal body weight: 53.6 kg (118 lb 3 oz)  Assessment   Diagnosis Status  1. Chronic lower extremity pain (Right)   2. Chronic radicular pain of lower extremity (Right)   3. DDD (degenerative disc disease), lumbosacral   4. Lumbosacral radiculopathy at L4 (Right)   5. Chronic low  back pain (Bilateral) (R>L) w/ sciatica (Right)    6. Abnormal MRI, lumbar spine (03/20/2021 & 01/23/2023)   7. Chronic knee pain (3ry area of Pain) (Bilateral) (R>L)   8. Osteoarthritis of knees (Bilateral)    Controlled Controlled Controlled   Updated Problems: No problems updated.   Plan of Care  Problem-specific:  No problem-specific Assessment & Plan notes found for this encounter.  Alyssa Vega is not on any long-term medications.  Pharmacotherapy (Medications Ordered): No orders of the defined types were placed in this encounter.  Orders:  Orders Placed This Encounter  Procedures   KNEE INJECTION    Indications: Knee arthralgia (pain) due to osteoarthritis (OA) Imaging: None ([id number]) Nursing: Please request pharmacy to have Monovisc (Hyaluronan) available in office.    Standing Status:   Future    Standing Expiration Date:   06/28/2023    Scheduling Instructions:     Procedure: Knee injection Monovisc (Hyaluronan)     Treatment No.: 1     Level: Intra-articular     Laterality: Bilateral Knee     Sedation: Patient's choice.     Timeframe: As soon as schedule allows     Total Hyaluronic acid (AKA hyaluronan or hyaluronate) per injection: 88 mg (22.0 mg/mL x 4 mL). Series of 1 injection.    Order Specific Question:   Where will this procedure be performed?    Answer:   [facility]   Ambulatory referral to Psychology    Referral Priority:   Routine    Referral Type:   Psychiatric    Referral Reason:   Specialty Services Required    Requested Specialty:   Psychology    Number of Visits Requested:   1   Follow-up plan:   Return for (Clinic): (B) Monovisc knee inj #1.      Interventional Therapies  Risk Factors  Considerations:    Most pain described to be from this level down except on right.    Planned  Pending:    Diagnostic/therapeutic right L4 TFESI #1 (NO STEROIDS) (done before on 03/19/2021 by Chasnis DO)    Under consideration:   Therapeutic left lumbar facet (L4-5, L5-S1) RFA  #1  (NO STEROIDS)    Completed:   (01/02/2023) lumbar MRI ordered (01/02/2023) referral to neurosurgery for evaluation Therapeutic right lumbar L4-5, L5-S1 facet RFA x1 (no steroids) (11/24/2022) (100/100/100/(R)LBP:100  (R)LEP:0) Diagnostic bilateral (L4-5, L5-S1) Lumbar Facet (L3-S1) MBB x2 (10/18/2022) (1st: 100/100/50 x 3 day) (2nd: ...   Completed by other providers:   By Dr. Yves Dill Therapeutic right L4-5 TFESI x2 (03/19/2021) by Merri Ray, DO [facility])  Therapeutic right L5-S1 TFESI x1 (03/19/2021) by Merri Ray, DO [facility])  Therapeutic right C6-7 TFESI x3 (05/19/2020) by Merri Ray, DO [facility]R)  Therapeutic right C6-7 TFESI x1 (04/30/2021) by Merri Ray, DO [facility]R)  Therapeutic bilateral IA steroid knee inj. x1 (07/30/2021) by Lasandra Beech, PA ([facility])    Therapeutic  Palliative (PRN) options:   None established      Recent Visits Date Type Provider Dept  03/29/23 Office Visit Delano Metz, MD [facility]  Showing recent visits within past 90 days and meeting all other requirements Future Appointments Date Type Provider Dept  04/11/23 Appointment Delano Metz, MD [facility]  Showing future appointments within next 90 days and meeting all other requirements  I discussed the assessment and treatment plan with the patient. The patient was provided an opportunity to ask questions and all were  answered. The patient agreed with the plan and demonstrated an understanding of the instructions.  Patient advised to call back or seek an in-person evaluation if the symptoms or condition worsens.  Duration of encounter: 30 minutes.  Total time on encounter, as per AMA guidelines included both the face-to-face and non-face-to-face time personally spent by the physician and/or other qualified health care professional(s) on the day of the encounter (includes time in activities that require the physician or other qualified health care  professional and does not include time in activities normally performed by clinical staff). Physician's time may include the following activities when performed: Preparing to see the patient (e.g., pre-charting review of records, searching for previously ordered imaging, lab work, and nerve conduction tests) Review of prior analgesic pharmacotherapies. Reviewing PMP Interpreting ordered tests (e.g., lab work, imaging, nerve conduction tests) Performing post-procedure evaluations, including interpretation of diagnostic procedures Obtaining and/or reviewing separately obtained history Performing a medically appropriate examination and/or evaluation Counseling and educating the patient/family/caregiver Ordering medications, tests, or procedures Referring and communicating with other health care professionals (when not separately reported) Documenting clinical information in the electronic or other health record Independently interpreting results (not separately reported) and communicating results to the patient/ family/caregiver Care coordination (not separately reported)  Note by: Oswaldo Done, MD Date: 03/29/2023; Time: 10:37 AM

## 2023-03-29 ENCOUNTER — Encounter: Payer: Self-pay | Admitting: Pain Medicine

## 2023-03-29 ENCOUNTER — Telehealth (INDEPENDENT_AMBULATORY_CARE_PROVIDER_SITE_OTHER): Payer: Self-pay | Admitting: Nurse Practitioner

## 2023-03-29 ENCOUNTER — Ambulatory Visit: Payer: PPO | Attending: Pain Medicine | Admitting: Pain Medicine

## 2023-03-29 VITALS — BP 97/48 | HR 85 | Temp 97.7°F | Resp 16 | Ht 60.0 in | Wt 145.0 lb

## 2023-03-29 DIAGNOSIS — G8929 Other chronic pain: Secondary | ICD-10-CM | POA: Diagnosis not present

## 2023-03-29 DIAGNOSIS — M5137 Other intervertebral disc degeneration, lumbosacral region: Secondary | ICD-10-CM | POA: Insufficient documentation

## 2023-03-29 DIAGNOSIS — R937 Abnormal findings on diagnostic imaging of other parts of musculoskeletal system: Secondary | ICD-10-CM | POA: Diagnosis not present

## 2023-03-29 DIAGNOSIS — M170 Bilateral primary osteoarthritis of knee: Secondary | ICD-10-CM | POA: Insufficient documentation

## 2023-03-29 DIAGNOSIS — M5410 Radiculopathy, site unspecified: Secondary | ICD-10-CM | POA: Diagnosis not present

## 2023-03-29 DIAGNOSIS — M25561 Pain in right knee: Secondary | ICD-10-CM | POA: Insufficient documentation

## 2023-03-29 DIAGNOSIS — M79604 Pain in right leg: Secondary | ICD-10-CM | POA: Insufficient documentation

## 2023-03-29 DIAGNOSIS — M5417 Radiculopathy, lumbosacral region: Secondary | ICD-10-CM | POA: Insufficient documentation

## 2023-03-29 DIAGNOSIS — M5441 Lumbago with sciatica, right side: Secondary | ICD-10-CM | POA: Diagnosis not present

## 2023-03-29 DIAGNOSIS — M25562 Pain in left knee: Secondary | ICD-10-CM | POA: Insufficient documentation

## 2023-03-29 NOTE — Patient Instructions (Addendum)
______________________________________________________________________    OTC Supplements: The following over-the-counter (OTC) supplements may be of some benefits when used in moderation in some chronic pain conditions. Note: Always consult with your primary care provider and/or pharmacist before taking any OTC medications to make sure there are no "drug-to-drug" interactions with the medications you currently take. Ask your physician which may be beneficial for your particular condition.  Supplement Possible benefit May be of benefit in treatment of   Turmeric/curcumin* anti-inflammatory Joint and muscle aches and pain associated with arthritis and inflammation  Glucosamine/chondroitin (triple strength)* may slow loss of articular cartilage Osteoarthritis  Vitamin D-3* may suppress release of chemicals associated with inflammation Joint and muscle aches and pain associated with arthritis and inflammation  Moringa* anti-inflammatory with mild analgesic effects Joint and muscle aches and pain associated with arthritis and inflammation  Melatonin* Helps reset sleep cycle. Insomnia. May also be helpful in neurodegenerative disorders  Vitamin B-12* may help keep nerves and blood cells healthy as well as maintaining function of nervous system Neuropathies. Nerve pain (Burning pain)  Alpha-Lipoic-Acid (ALA)* antioxidant that may help with nerve health, pain, and blocking the activation of some inflammatory chemicals Diabetic neuropathy and metabolic syndrome       (*Always use manufacturer's recommended dosage.)  ______________________________________________________________________      ______________________________________________________________________    Procedure instructions  Stop blood-thinners  Do not eat or drink fluids (other than water) for 6 hours before your procedure  No water for 2 hours before your procedure  Take your blood pressure medicine with a sip of water  Arrive 30  minutes before your appointment  If sedation is planned, bring suitable driver. Pennie Banter, Benedetto Goad, & public transportation are NOT APPROVED)  Carefully read the "Preparing for your procedure" detailed instructions  If you have questions call us at [phone]  ______________________________________________________________________      ______________________________________________________________________    Preparing for your procedure  Appointments: If you think you may not be able to keep your appointment, call 24-48 hours in advance to cancel. We need time to make it available to others.  During your procedure appointment there will be: No Prescription Refills. No disability issues to discussed. No medication changes or discussions.  Instructions: Food intake: Avoid eating anything solid for at least 8 hours prior to your procedure. Clear liquid intake: You may take clear liquids such as water up to 2 hours prior to your procedure. (No carbonated drinks. No soda.) Transportation: Unless otherwise stated by your physician, bring a driver. (Driver cannot be a Market researcher, Pharmacist, community, or any other form of public transportation.) Morning Medicines: Except for blood thinners, take all of your other morning medications with a sip of water. Make sure to take your heart and blood pressure medicines. If your blood pressure's lower number is above 100, the case will be rescheduled. Blood thinners: Make sure to stop your blood thinners as instructed.  If you take a blood thinner, but were not instructed to stop it, call our office [phone] and ask to talk to a nurse. Not stopping a blood thinner prior to certain procedures could lead to serious complications. Diabetics on insulin: Notify the staff so that you can be scheduled 1st case in the morning. If your diabetes requires high dose insulin, take only  of your normal insulin dose the morning of the procedure and notify the staff that you have done  so. Preventing infections: Shower with an antibacterial soap the morning of your procedure.  Build-up your immune system: Take 1000  mg of Vitamin C with every meal (3 times a day) the day prior to your procedure. Antibiotics: Inform the nursing staff if you are taking any antibiotics or if you have any conditions that may require antibiotics prior to procedures. (Example: recent joint implants)   Pregnancy: If you are pregnant make sure to notify the nursing staff. Not doing so may result in injury to the fetus, including death.  Sickness: If you have a cold, fever, or any active infections, call and cancel or reschedule your procedure. Receiving steroids while having an infection may result in complications. Arrival: You must be in the facility at least 30 minutes prior to your scheduled procedure. Tardiness: Your scheduled time is also the cutoff time. If you do not arrive at least 15 minutes prior to your procedure, you will be rescheduled.  Children: Do not bring any children with you. Make arrangements to keep them home. Dress appropriately: There is always a possibility that your clothing may get soiled. Avoid long dresses. Valuables: Do not bring any jewelry or valuables.  Reasons to call and reschedule or cancel your procedure: (Following these recommendations will minimize the risk of a serious complication.) Surgeries: Avoid having procedures within 2 weeks of any surgery. (Avoid for 2 weeks before or after any surgery). Flu Shots: Avoid having procedures within 2 weeks of a flu shots or . (Avoid for 2 weeks before or after immunizations). Barium: Avoid having a procedure within 7-10 days after having had a radiological study involving the use of radiological contrast. (Myelograms, Barium swallow or enema study). Heart attacks: Avoid any elective procedures or surgeries for the initial 6 months after a "Myocardial Infarction" (Heart Attack). Blood thinners: It is imperative that you stop  these medications before procedures. Let us know if you if you take any blood thinner.  Infection: Avoid procedures during or within two weeks of an infection (including chest colds or gastrointestinal problems). Symptoms associated with infections include: Localized redness, fever, chills, night sweats or profuse sweating, burning sensation when voiding, cough, congestion, stuffiness, runny nose, sore throat, diarrhea, nausea, vomiting, cold or Flu symptoms, recent or current infections. It is specially important if the infection is over the area that we intend to treat. Heart and lung problems: Symptoms that may suggest an active cardiopulmonary problem include: cough, chest pain, breathing difficulties or shortness of breath, dizziness, ankle swelling, uncontrolled high or unusually low blood pressure, and/or palpitations. If you are experiencing any of these symptoms, cancel your procedure and contact your primary care physician for an evaluation.  Remember:  Regular Business hours are:  Monday to Thursday 8:00 AM to 4:00 PM  Provider's Schedule: Delano Metz, MD:  Procedure days: Tuesday and Thursday 7:30 AM to 4:00 PM  Edward Jolly, MD:  Procedure days: Monday and Wednesday 7:30 AM to 4:00 PM Last  Updated: 03/07/2023 ______________________________________________________________________      ______________________________________________________________________    General Risks and Possible Complications  Patient Responsibilities: It is important that you read this as it is part of your informed consent. It is our duty to inform you of the risks and possible complications associated with treatments offered to you. It is your responsibility as a patient to read this and to ask questions about anything that is not clear or that you believe was not covered in this document.  Patient's Rights: You have the right to refuse treatment. You also have the right to change your mind, even  after initially having agreed to have the treatment done.  However, under this last option, if you wait until the last second to change your mind, you may be charged for the materials used up to that point.  Introduction: Medicine is not an Visual merchandiser. Everything in Medicine, including the lack of treatment(s), carries the potential for danger, harm, or loss (which is by definition: Risk). In Medicine, a complication is a secondary problem, condition, or disease that can aggravate an already existing one. All treatments carry the risk of possible complications. The fact that a side effects or complications occurs, does not imply that the treatment was conducted incorrectly. It must be clearly understood that these can happen even when everything is done following the highest safety standards.  No treatment: You can choose not to proceed with the proposed treatment alternative. The "PRO(s)" would include: avoiding the risk of complications associated with the therapy. The "CON(s)" would include: not getting any of the treatment benefits. These benefits fall under one of three categories: diagnostic; therapeutic; and/or palliative. Diagnostic benefits include: getting information which can ultimately lead to improvement of the disease or symptom(s). Therapeutic benefits are those associated with the successful treatment of the disease. Finally, palliative benefits are those related to the decrease of the primary symptoms, without necessarily curing the condition (example: decreasing the pain from a flare-up of a chronic condition, such as incurable terminal cancer).  General Risks and Complications: These are associated to most interventional treatments. They can occur alone, or in combination. They fall under one of the following six (6) categories: no benefit or worsening of symptoms; bleeding; infection; nerve damage; allergic reactions; and/or death. No benefits or worsening of symptoms: In Medicine there  are no guarantees, only probabilities. No healthcare provider can ever guarantee that a medical treatment will work, they can only state the probability that it may. Furthermore, there is always the possibility that the condition may worsen, either directly, or indirectly, as a consequence of the treatment. Bleeding: This is more common if the patient is taking a blood thinner, either prescription or over the counter (example: Goody Powders, Fish oil, Aspirin, Garlic, etc.), or if suffering a condition associated with impaired coagulation (example: Hemophilia, cirrhosis of the liver, low platelet counts, etc.). However, even if you do not have one on these, it can still happen. If you have any of these conditions, or take one of these drugs, make sure to notify your treating physician. Infection: This is more common in patients with a compromised immune system, either due to disease (example: diabetes, cancer, human immunodeficiency virus [HIV], etc.), or due to medications or treatments (example: therapies used to treat cancer and rheumatological diseases). However, even if you do not have one on these, it can still happen. If you have any of these conditions, or take one of these drugs, make sure to notify your treating physician. Nerve Damage: This is more common when the treatment is an invasive one, but it can also happen with the use of medications, such as those used in the treatment of cancer. The damage can occur to small secondary nerves, or to large primary ones, such as those in the spinal cord and brain. This damage may be temporary or permanent and it may lead to impairments that can range from temporary numbness to permanent paralysis and/or brain death. Allergic Reactions: Any time a substance or material comes in contact with our body, there is the possibility of an allergic reaction. These can range from a mild skin rash (contact dermatitis) to a severe systemic  reaction (anaphylactic  reaction), which can result in death. Death: In general, any medical intervention can result in death, most of the time due to an unforeseen complication. ______________________________________________________________________

## 2023-03-29 NOTE — Progress Notes (Signed)
Safety precautions to be maintained throughout the outpatient stay will include: orient to surroundings, keep bed in low position, maintain call bell within reach at all times, provide assistance with transfer out of bed and ambulation.  

## 2023-03-29 NOTE — Telephone Encounter (Signed)
Patient came into office inquiring about laser. Patient would like a phone call to see the status and where she is with getting scheduled    Please call and advise

## 2023-03-30 NOTE — Telephone Encounter (Signed)
LVM stating that we are currently scheduling from the month of June and it will probably be the beginning of January before anyone reaches out to schedule her appt. Any further questions she can call us back at the office.

## 2023-04-11 ENCOUNTER — Ambulatory Visit: Payer: PPO | Admitting: Pain Medicine

## 2023-04-11 ENCOUNTER — Other Ambulatory Visit: Payer: Self-pay | Admitting: Pain Medicine

## 2023-04-11 MED ORDER — ROPIVACAINE HCL 2 MG/ML IJ SOLN
INTRAMUSCULAR | Status: AC
  Filled 2023-04-11: qty 20

## 2023-04-11 MED ORDER — LIDOCAINE HCL (PF) 2 % IJ SOLN
INTRAMUSCULAR | Status: AC
  Filled 2023-04-11: qty 5

## 2023-04-11 NOTE — Progress Notes (Deleted)
PROVIDER NOTE: Interpretation of information contained herein should be left to medically-trained personnel. Specific patient instructions are provided elsewhere under "Patient Instructions" section of medical record. This document was created in part using STT-dictation technology, any transcriptional errors that may result from this process are unintentional.  Patient: Alyssa Vega Type: Established DOB: 1956-03-19 MRN: [id number] PCP: Marisue Ivan, MD  Service: Procedure DOS: 04/11/2023 Setting: Ambulatory Location: Ambulatory outpatient facility Delivery: Face-to-face Provider: Oswaldo Done, MD Specialty: Interventional Pain Management Specialty designation: 09 Location: Outpatient facility Ref. Prov.: Marisue Ivan, MD       Interventional Therapy   Type:  Monovisc Intra-articular Knee Injection #1  Laterality: Bilateral (-50) Level/approach: Lateral Imaging guidance: None required ([id number]) Anesthesia: Local anesthesia (1-2% Lidocaine) Anxiolysis:    ***                    Sedation:                         DOS: 04/11/2023  Performed by: Oswaldo Done, MD  Purpose: Diagnostic/Therapeutic Indications: Knee arthralgia associated to osteoarthritis of the knee 1. Chronic knee pain (3ry area of Pain) (Bilateral) (R>L)   2. Osteoarthritis of knees (Bilateral)    NAS-11 score:   Pre-procedure:  /10   Post-procedure:  /10     Pre-Procedure Preparation  Monitoring: As per clinic protocol.  Risk Assessment: Vitals:  [id number] body mass index is 28.32 kg/m as calculated from the following:   Height as of 03/29/23: 5' (1.524 m).   Weight as of 03/29/23: 145 lb (65.8 kg)., Rate:  , BP: , Resp: , Temp: , SpO2:   Allergies: She is allergic to duloxetine, atorvastatin, cyclobenzaprine, ibuprofen, melatonin, meloxicam, tizanidine, and venlafaxine.  Precautions: No additional precautions required  Blood-thinner(s): None at this time  Coagulopathies:  Reviewed. None identified.   Active Infection(s): Reviewed. None identified. Alyssa Vega is afebrile   Location setting: Exam room Position: Sitting w/ knee bent 90 degrees Safety Precautions: Patient was assessed for positional comfort and pressure points before starting the procedure. Prepping solution: DuraPrep (Iodine Povacrylex [0.7% available iodine] and Isopropyl Alcohol, 74% w/w) Prep Area: Entire knee region Approach: percutaneous, just above the tibial plateau, lateral to the infrapatellar tendon. Intended target: Intra-articular knee space Materials: Tray: Block Needle(s): Regular Qty: 1/side Length: 1.5-inch Gauge: 25G (x1) + 22G (x1)  No orders of the defined types were placed in this encounter.   No orders of the defined types were placed in this encounter.    Time-out:   I initiated and conducted the "Time-out" before starting the procedure, as per protocol. The patient was asked to participate by confirming the accuracy of the "Time Out" information. Verification of the correct person, site, and procedure were performed and confirmed by me, the nursing staff, and the patient. "Time-out" conducted as per Joint Commission's Universal Protocol (UP.01.01.01). Procedure checklist: Completed  H&P (Pre-op  Assessment)  Alyssa Vega is a 67 y.o. (year old), female patient, seen today for interventional treatment. She  has a past surgical history that includes Ovarian cyst removal; Colonoscopy with propofol (N/A, 04/20/2020); and Esophagogastroduodenoscopy (egd) with propofol (N/A, 04/20/2020). Alyssa Vega has a current medication list which includes the following prescription(s): acetaminophen, diclofenac, glucosamine-chondroitin, multivitamin, and pantoprazole. Her primarily concern today is the No chief complaint on file.  She is allergic to duloxetine, atorvastatin, cyclobenzaprine, ibuprofen, melatonin, meloxicam, tizanidine, and venlafaxine.   Last encounter: My last encounter with  her was  on 03/29/2023. Pertinent problems: Alyssa Vega has Abdominal pain, generalized; Lower abdominal pain; Cervical radiculopathy; Chronic daily headache; LLQ pain; Chronic pain syndrome; Chronic low back pain (1ry area of Pain) (Bilateral) (R>L) w/o sciatica; Chronic hip pain (2ry area of Pain) (Bilateral) (L>R); Chronic knee pain (3ry area of Pain) (Bilateral) (R>L); Chronic neck and back pain (4th area of Pain) (Bilateral) (L>R); Chronic upper back pain (5th area of Pain) (Bilateral); Chronic shoulder pain (6th area of Pain) (Bilateral) (L>R); Cervicogenic headache (7th area of Pain) (Bilateral) (R>L); Cervico-occipital neuralgia (Right); Chronic feet pain (8th area of Pain) (Bilateral) (R>L); Abnormal MRI, lumbar spine (03/20/2021 & 01/23/2023); Abnormal MRI, cervical spine (03/20/2021); Cellulitis of forearm (Left); Traumatic hematoma of left forearm; Lumbar facet joint syndrome; Lumbar facet arthropathy (Multilevel) (Bilateral); Spondylosis without myelopathy or radiculopathy, cervical region; DDD (degenerative disc disease), cervical; 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 Pain Assessment: Severity of   is reported as a  /10. Location:    / . Onset:  . Quality:  . Timing:  . Modifying factor(s):  Marland Kitchen Vitals:  vitals were not taken for this visit.   Reason for encounter: "interventional pain management therapy  due pain of at least four (4) weeks in duration, with failure to respond and/or inability to tolerate more conservative care.  Site Confirmation: Alyssa Vega was asked to confirm the procedure and laterality before marking the site.  Consent: Before the procedure and under the influence of no sedative(s), amnesic(s), or anxiolytics, the patient was informed of the treatment options, risks and possible complications. To fulfill our ethical and legal obligations, as recommended by the American Medical Association's Code of Ethics, I have informed the patient of my clinical impression; the nature and purpose of the treatment or procedure; the risks, benefits, and possible complications of the intervention; the alternatives, including doing nothing; the risk(s) and benefit(s) of the alternative treatment(s) or procedure(s); and the risk(s) and benefit(s) of doing nothing. The patient was provided information about the general risks and possible complications associated with the procedure. These may include, but are not limited to: failure to achieve desired goals, infection, bleeding, organ or nerve damage, allergic reactions, paralysis, and death. In addition, the patient was informed of those risks and complications associated to Spine-related procedures, such as failure to decrease pain; infection (i.e.: Meningitis, epidural or intraspinal abscess); bleeding (i.e.: epidural hematoma, subarachnoid hemorrhage, or any other type of intraspinal or peri-dural bleeding); organ or nerve damage (i.e.: Any type of peripheral nerve, nerve root, or spinal cord injury) with subsequent damage to sensory, motor, and/or autonomic systems, resulting in permanent pain, numbness, and/or weakness of one or several areas of the body; allergic reactions; (i.e.: anaphylactic reaction); and/or death. Furthermore, the patient was informed of those risks and complications associated with the medications. These include, but are not  limited to: allergic reactions (i.e.: anaphylactic or anaphylactoid reaction(s)); adrenal axis suppression; blood sugar elevation that in diabetics may result in ketoacidosis or comma; water retention that in patients with history of congestive heart failure may result in shortness of breath, pulmonary edema, and  decompensation with resultant heart failure; weight gain; swelling or edema; medication-induced neural toxicity; particulate matter embolism and blood vessel occlusion with resultant organ, and/or nervous system infarction; and/or aseptic necrosis of one or more joints. Finally, the patient was informed that Medicine is not an exact science; therefore, there is also the possibility of unforeseen or unpredictable risks and/or possible complications that may result in a catastrophic outcome. The patient indicated having understood very clearly. We have given the patient no guarantees and we have made no promises. Enough time was given to the patient to ask questions, all of which were answered to the patient's satisfaction. Alyssa Vega has indicated that she wanted to continue with the procedure. Attestation: I, the ordering provider, attest that I have discussed with the patient the benefits, risks, side-effects, alternatives, likelihood of achieving goals, and potential problems during recovery for the procedure that I have provided informed consent.  Date  Time: {CHL [facility]-PAIN TIME CHOICES:21018001}  Description of procedure  Start Time:   hrs  Local Anesthesia: Once the patient was positioned, prepped, and time-out was completed. The target area was identified located. The skin was marked with an approved surgical skin marker. Once marked, the skin (epidermis, dermis, and hypodermis), and deeper tissues (fat, connective tissue and muscle) were infiltrated with a small amount of a short-acting local anesthetic, loaded on a 10cc syringe with a 25G, 1.5-in  Needle. An appropriate amount of time was  allowed for local anesthetics to take effect before proceeding to the next step. Local Anesthetic: Lidocaine 1-2% The unused portion of the local anesthetic was discarded in the proper designated containers. Safety Precautions: Aspiration looking for blood return was conducted prior to all injections. At no point did I inject any substances, as a needle was being advanced. Before injecting, the patient was told to immediately notify me if she was experiencing any new onset of "ringing in the ears, or metallic taste in the mouth". No attempts were made at seeking any paresthesias. Safe injection practices and needle disposal techniques used. Medications properly checked for expiration dates. SDV (single dose vial) medications used. After the completion of the procedure, all disposable equipment used was discarded in the proper designated medical waste containers.  Technical description: Protocol guidelines were followed. After positioning, the target area was identified and prepped in the usual manner. Skin & deeper tissues infiltrated with local anesthetic. Appropriate amount of time allowed to pass for local anesthetics to take effect. Proper needle placement secured. Once satisfactory needle placement was confirmed, I proceeded to inject the desired solution in slow, incremental fashion, intermittently assessing for discomfort or any signs of abnormal or undesired spread of substance. Once completed, the needle was removed and disposed of, as per hospital protocols. The area was cleaned, making sure to leave some of the prepping solution back to take advantage of its long term bactericidal properties.  Aspiration:  Negative        There were no vitals filed for this visit.  End Time:   hrs  Imaging guidance  Imaging-assisted Technique: None required. Indication(s): N/A Exposure Time: N/A Contrast: None Fluoroscopic Guidance: N/A Ultrasound Guidance: N/A Interpretation: N/A  Post-op  assessment  Post-procedure Vital Signs:  Pulse/HCG Rate:    Temp:   Resp:   BP:   SpO2:    EBL: None  Complications: No immediate post-treatment complications observed by team, or reported by patient.  Note: The patient tolerated the entire procedure well. A repeat set of vitals were taken after the  procedure and the patient was kept under observation following institutional policy, for this type of procedure. Post-procedural neurological assessment was performed, showing return to baseline, prior to discharge. The patient was provided with post-procedure discharge instructions, including a section on how to identify potential problems. Should any problems arise concerning this procedure, the patient was given instructions to immediately contact us, at any time, without hesitation. In any case, we plan to contact the patient by telephone for a follow-up status report regarding this interventional procedure.  Comments:  No additional relevant information.  Plan of care  Chronic Opioid Analgesic:  None MME/day: 0 mg/day   Medications administered: Tywana A. Collard had no medications administered during this visit.  Follow-up plan:   No follow-ups on file.      Interventional Therapies  Risk Factors  Considerations:    Most pain described to be from this level down except on right.    Planned  Pending:    Diagnostic/therapeutic right L4 TFESI #1 (NO STEROIDS) (done before on 03/19/2021 by Chasnis DO)    Under consideration:   Therapeutic left lumbar facet (L4-5, L5-S1) RFA #1  (NO STEROIDS)    Completed:   (01/02/2023) lumbar MRI ordered (01/02/2023) referral to neurosurgery for evaluation Therapeutic right lumbar L4-5, L5-S1 facet RFA x1 (no steroids) (11/24/2022) (100/100/100/(R)LBP:100  (R)LEP:0) Diagnostic bilateral (L4-5, L5-S1) Lumbar Facet (L3-S1) MBB x2 (10/18/2022) (1st: 100/100/50 x 3 day) (2nd: ...   Completed by other providers:   By Dr. Yves Dill Therapeutic right  L4-5 TFESI x2 (03/19/2021) by Merri Ray, DO [facility])  Therapeutic right L5-S1 TFESI x1 (03/19/2021) by Merri Ray, DO [facility])  Therapeutic right C6-7 TFESI x3 (05/19/2020) by Merri Ray, DO [facility]R)  Therapeutic right C6-7 TFESI x1 (04/30/2021) by Merri Ray, DO [facility] PMR)  Therapeutic bilateral IA steroid knee inj. x1 (07/30/2021) by Lasandra Beech, PA ([facility])    Therapeutic  Palliative (PRN) options:   None established      Recent Visits Date Type Provider Dept  03/29/23 Office Visit Delano Metz, MD [facility]  Showing recent visits within past 90 days and meeting all other requirements Today's Visits Date Type Provider Dept  04/11/23 Appointment Delano Metz, MD [facility]  Showing today's visits and meeting all other requirements Future Appointments No visits were found meeting these conditions. Showing future appointments within next 90 days and meeting all other requirements   Disposition: Discharge home  Discharge (Date  Time): 04/11/2023;   hrs.   Primary Care Physician: Marisue Ivan, MD Location: [facility] Note by: Oswaldo Done, MD Date: 04/11/2023; Time: 7:28 AM  DISCLAIMER: Medicine is not an Visual merchandiser. It has no guarantees or warranties. The decision to proceed with this intervention was based on the information collected from the patient. Conclusions were drawn from the patient's questionnaire, interview, and examination. Because information was provided in large part by the patient, it cannot be guaranteed that it has not been purposely or unconsciously manipulated or altered. Every effort has been made to obtain as much accurate, relevant, available data as possible. Always take into account that the treatment will also be dependent on availability of resources and existing treatment guidelines, considered by other Pain Management Specialists as being common  knowledge and practice, at the time of the intervention. It is also important to point out that variation in procedural techniques and pharmacological choices are the acceptable norm. For Medico-Legal review purposes, the indications, contraindications, technique, and results of the  these procedures should only be evaluated, judged and interpreted by a Board-Certified Interventional Pain Specialist with extensive familiarity and expertise in the same exact procedure and technique.

## 2023-04-16 NOTE — Progress Notes (Deleted)
PROVIDER NOTE: Interpretation of information contained herein should be left to medically-trained personnel. Specific patient instructions are provided elsewhere under "Patient Instructions" section of medical record. This document was created in part using STT-dictation technology, any transcriptional errors that may result from this process are unintentional.  Patient: Alyssa Vega Type: Established DOB: Dec 23, 1955 MRN: [id number] PCP: Alyssa Ivan, MD  Service: Procedure DOS: 04/20/2023 Setting: Ambulatory Location: Ambulatory outpatient facility Delivery: Face-to-face Provider: Oswaldo Done, MD Specialty: Interventional Pain Management Specialty designation: 09 Location: Outpatient facility Ref. Prov.: Alyssa Ivan, MD       Interventional Therapy   Type:  Monovisc Intra-articular Knee Injection #1  Laterality: Bilateral (-50) Level/approach: Lateral Imaging guidance: None required ([id number]) Anesthesia: Local anesthesia (1-2% Lidocaine) Anxiolysis:    ***                    Sedation:                         DOS: 04/20/2023  Performed by: Alyssa Done, MD  Purpose: Diagnostic/Therapeutic Indications: Knee arthralgia associated to osteoarthritis of the knee 1. Chronic knee pain (3ry area of Pain) (Bilateral) (R>L)   2. Osteoarthritis of knees (Bilateral)    NAS-11 score:   Pre-procedure:  /10   Post-procedure:  /10     Pre-Procedure Preparation  Monitoring: As per clinic protocol.  Risk Assessment: Vitals:  QVZ:DGLOVFIEP body mass index is 28.32 kg/m as calculated from the following:   Height as of 03/29/23: 5' (1.524 m).   Weight as of 03/29/23: 145 lb (65.8 kg)., Rate:  , BP: , Resp: , Temp: , SpO2:   Allergies: She is allergic to duloxetine, atorvastatin, cyclobenzaprine, ibuprofen, melatonin, meloxicam, tizanidine, and venlafaxine.  Precautions: No additional precautions required  Blood-thinner(s): None at this time  Coagulopathies:  Reviewed. None identified.   Active Infection(s): Reviewed. None identified. Alyssa Vega is afebrile   Location setting: Exam room Position: Sitting w/ knee bent 90 degrees Safety Precautions: Patient was assessed for positional comfort and pressure points before starting the procedure. Prepping solution: DuraPrep (Iodine Povacrylex [0.7% available iodine] and Isopropyl Alcohol, 74% w/w) Prep Area: Entire knee region Approach: percutaneous, just above the tibial plateau, lateral to the infrapatellar tendon. Intended target: Intra-articular knee space Materials: Tray: Block Needle(s): Regular Qty: 1/side Length: 1.5-inch Gauge: 25G (x1) + 22G (x1)  No orders of the defined types were placed in this encounter.   No orders of the defined types were placed in this encounter.    Time-out:   I initiated and conducted the "Time-out" before starting the procedure, as per protocol. The patient was asked to participate by confirming the accuracy of the "Time Out" information. Verification of the correct person, site, and procedure were performed and confirmed by me, the nursing staff, and the patient. "Time-out" conducted as per Joint Commission's Universal Protocol (UP.01.01.01). Procedure checklist: Completed  H&P (Pre-op  Assessment)  Alyssa Vega is a 66 y.o. (year old), female patient, seen today for interventional treatment. She  has a past surgical history that includes Ovarian cyst removal; Colonoscopy with propofol (N/A, 04/20/2020); and Esophagogastroduodenoscopy (egd) with propofol (N/A, 04/20/2020). Alyssa Vega has a current medication list which includes the following prescription(s): acetaminophen, diclofenac, glucosamine-chondroitin, multivitamin, and pantoprazole. Her primarily concern today is the No chief complaint on file.  She is allergic to duloxetine, atorvastatin, cyclobenzaprine, ibuprofen, melatonin, meloxicam, tizanidine, and venlafaxine.   Last encounter: My last encounter with  her was  on 03/29/2023. Pertinent problems: Alyssa Vega has Abdominal pain, generalized; Lower abdominal pain; Cervical radiculopathy; Chronic daily headache; LLQ pain; Chronic pain syndrome; Chronic low back pain (1ry area of Pain) (Bilateral) (R>L) w/o sciatica; Chronic hip pain (2ry area of Pain) (Bilateral) (L>R); Chronic knee pain (3ry area of Pain) (Bilateral) (R>L); Chronic neck and back pain (4th area of Pain) (Bilateral) (L>R); Chronic upper back pain (5th area of Pain) (Bilateral); Chronic shoulder pain (6th area of Pain) (Bilateral) (L>R); Cervicogenic headache (7th area of Pain) (Bilateral) (R>L); Cervico-occipital neuralgia (Right); Chronic feet pain (8th area of Pain) (Bilateral) (R>L); Abnormal MRI, lumbar spine (03/20/2021 & 01/23/2023); Abnormal MRI, cervical spine (03/20/2021); Cellulitis of forearm (Left); Traumatic hematoma of left forearm; Lumbar facet joint syndrome; Lumbar facet arthropathy (Multilevel) (Bilateral); Spondylosis without myelopathy or radiculopathy, cervical region; DDD (degenerative disc disease), cervical; 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 Pain Assessment: Severity of   is reported as a  /10. Location:    / . Onset:  . Quality:  . Timing:  . Modifying factor(s):  Marland Kitchen Vitals:  vitals were not taken for this visit.   Reason for encounter: "interventional pain management therapy  due pain of at least four (4) weeks in duration, with failure to respond and/or inability to tolerate more conservative care.  Site Confirmation: Alyssa Vega was asked to confirm the procedure and laterality before marking the site.  Consent: Before the procedure and under the influence of no sedative(s), amnesic(s), or anxiolytics, the patient was informed of the treatment options, risks and possible complications. To fulfill our ethical and legal obligations, as recommended by the American Medical Association's Code of Ethics, I have informed the patient of my clinical impression; the nature and purpose of the treatment or procedure; the risks, benefits, and possible complications of the intervention; the alternatives, including doing nothing; the risk(s) and benefit(s) of the alternative treatment(s) or procedure(s); and the risk(s) and benefit(s) of doing nothing. The patient was provided information about the general risks and possible complications associated with the procedure. These may include, but are not limited to: failure to achieve desired goals, infection, bleeding, organ or nerve damage, allergic reactions, paralysis, and death. In addition, the patient was informed of those risks and complications associated to Spine-related procedures, such as failure to decrease pain; infection (i.e.: Meningitis, epidural or intraspinal abscess); bleeding (i.e.: epidural hematoma, subarachnoid hemorrhage, or any other type of intraspinal or peri-dural bleeding); organ or nerve damage (i.e.: Any type of peripheral nerve, nerve root, or spinal cord injury) with subsequent damage to sensory, motor, and/or autonomic systems, resulting in permanent pain, numbness, and/or weakness of one or several areas of the body; allergic reactions; (i.e.: anaphylactic reaction); and/or death. Furthermore, the patient was informed of those risks and complications associated with the medications. These include, but are not  limited to: allergic reactions (i.e.: anaphylactic or anaphylactoid reaction(s)); adrenal axis suppression; blood sugar elevation that in diabetics may result in ketoacidosis or comma; water retention that in patients with history of congestive heart failure may result in shortness of breath, pulmonary edema, and  decompensation with resultant heart failure; weight gain; swelling or edema; medication-induced neural toxicity; particulate matter embolism and blood vessel occlusion with resultant organ, and/or nervous system infarction; and/or aseptic necrosis of one or more joints. Finally, the patient was informed that Medicine is not an exact science; therefore, there is also the possibility of unforeseen or unpredictable risks and/or possible complications that may result in a catastrophic outcome. The patient indicated having understood very clearly. We have given the patient no guarantees and we have made no promises. Enough time was given to the patient to ask questions, all of which were answered to the patient's satisfaction. Ms. Watterson has indicated that she wanted to continue with the procedure. Attestation: I, the ordering provider, attest that I have discussed with the patient the benefits, risks, side-effects, alternatives, likelihood of achieving goals, and potential problems during recovery for the procedure that I have provided informed consent.  Date  Time: {CHL [facility]-PAIN TIME CHOICES:21018001}  Description of procedure  Start Time:   hrs  Local Anesthesia: Once the patient was positioned, prepped, and time-out was completed. The target area was identified located. The skin was marked with an approved surgical skin marker. Once marked, the skin (epidermis, dermis, and hypodermis), and deeper tissues (fat, connective tissue and muscle) were infiltrated with a small amount of a short-acting local anesthetic, loaded on a 10cc syringe with a 25G, 1.5-in  Needle. An appropriate amount of time was  allowed for local anesthetics to take effect before proceeding to the next step. Local Anesthetic: Lidocaine 1-2% The unused portion of the local anesthetic was discarded in the proper designated containers. Safety Precautions: Aspiration looking for blood return was conducted prior to all injections. At no point did I inject any substances, as a needle was being advanced. Before injecting, the patient was told to immediately notify me if she was experiencing any new onset of "ringing in the ears, or metallic taste in the mouth". No attempts were made at seeking any paresthesias. Safe injection practices and needle disposal techniques used. Medications properly checked for expiration dates. SDV (single dose vial) medications used. After the completion of the procedure, all disposable equipment used was discarded in the proper designated medical waste containers.  Technical description: Protocol guidelines were followed. After positioning, the target area was identified and prepped in the usual manner. Skin & deeper tissues infiltrated with local anesthetic. Appropriate amount of time allowed to pass for local anesthetics to take effect. Proper needle placement secured. Once satisfactory needle placement was confirmed, I proceeded to inject the desired solution in slow, incremental fashion, intermittently assessing for discomfort or any signs of abnormal or undesired spread of substance. Once completed, the needle was removed and disposed of, as per hospital protocols. The area was cleaned, making sure to leave some of the prepping solution back to take advantage of its long term bactericidal properties.  Aspiration:  Negative        There were no vitals filed for this visit.  End Time:   hrs  Imaging guidance  Imaging-assisted Technique: None required. Indication(s): N/A Exposure Time: N/A Contrast: None Fluoroscopic Guidance: N/A Ultrasound Guidance: N/A Interpretation: N/A  Post-op  assessment  Post-procedure Vital Signs:  Pulse/HCG Rate:    Temp:   Resp:   BP:   SpO2:    EBL: None  Complications: No immediate post-treatment complications observed by team, or reported by patient.  Note: The patient tolerated the entire procedure well. A repeat set of vitals were taken after the  procedure and the patient was kept under observation following institutional policy, for this type of procedure. Post-procedural neurological assessment was performed, showing return to baseline, prior to discharge. The patient was provided with post-procedure discharge instructions, including a section on how to identify potential problems. Should any problems arise concerning this procedure, the patient was given instructions to immediately contact us, at any time, without hesitation. In any case, we plan to contact the patient by telephone for a follow-up status report regarding this interventional procedure.  Comments:  No additional relevant information.  Plan of care  Chronic Opioid Analgesic:  None MME/day: 0 mg/day   Medications administered: Suriyah A. Molony had no medications administered during this visit.  Follow-up plan:   No follow-ups on file.      Interventional Therapies  Risk Factors  Considerations:    Most pain described to be from this level down except on right.    Planned  Pending:    Diagnostic/therapeutic right L4 TFESI #1 (NO STEROIDS) (Vega before on 03/19/2021 by Chasnis DO)    Under consideration:   Therapeutic left lumbar facet (L4-5, L5-S1) RFA #1  (NO STEROIDS)    Completed:   (01/02/2023) lumbar MRI ordered (01/02/2023) referral to neurosurgery for evaluation Therapeutic right lumbar L4-5, L5-S1 facet RFA x1 (no steroids) (11/24/2022) (100/100/100/(R)LBP:100  (R)LEP:0) Diagnostic bilateral (L4-5, L5-S1) Lumbar Facet (L3-S1) MBB x2 (10/18/2022) (1st: 100/100/50 x 3 day) (2nd: ...   Completed by other providers:   By Dr. Yves Dill Therapeutic right  L4-5 TFESI x2 (03/19/2021) by Merri Ray, DO [facility])  Therapeutic right L5-S1 TFESI x1 (03/19/2021) by Merri Ray, DO [facility] PMR)  Therapeutic right C6-7 TFESI x3 (05/19/2020) by Merri Ray, DO [facility] PMR)  Therapeutic right C6-7 TFESI x1 (04/30/2021) by Merri Ray, DO [facility] PMR)  Therapeutic bilateral IA steroid knee inj. x1 (07/30/2021) by Lasandra Beech, PA ([facility])    Therapeutic  Palliative (PRN) options:   None established       Recent Visits Date Type Provider Dept  03/29/23 Office Visit Delano Metz, MD [facility]  Showing recent visits within past 90 days and meeting all other requirements Future Appointments Date Type Provider Dept  04/20/23 Appointment Delano Metz, MD [facility]  Showing future appointments within next 90 days and meeting all other requirements   Disposition: Discharge home  Discharge (Date  Time): 04/20/2023;   hrs.   Primary Care Physician: Alyssa Ivan, MD Location: [facility] Note by: Alyssa Done, MD Date: 04/20/2023; Time: 1:01 PM  DISCLAIMER: Medicine is not an Visual merchandiser. It has no guarantees or warranties. The decision to proceed with this intervention was based on the information collected from the patient. Conclusions were drawn from the patient's questionnaire, interview, and examination. Because information was provided in large part by the patient, it cannot be guaranteed that it has not been purposely or unconsciously manipulated or altered. Every effort has been made to obtain as much accurate, relevant, available data as possible. Always take into account that the treatment will also be dependent on availability of resources and existing treatment guidelines, considered by other Pain Management Specialists as being common knowledge and practice, at the time of the intervention. It is also important to point out that variation in procedural  techniques and pharmacological choices are the acceptable norm. For Medico-Legal review purposes, the indications, contraindications, technique, and results of the these procedures should only be evaluated, judged and interpreted by a Board-Certified Interventional Pain Specialist with  extensive familiarity and expertise in the same exact procedure and technique.

## 2023-04-16 NOTE — Progress Notes (Signed)
PROVIDER NOTE: Interpretation of information contained herein should be left to medically-trained personnel. Specific patient instructions are provided elsewhere under "Patient Instructions" section of medical record. This document was created in part using STT-dictation technology, any transcriptional errors that may result from this process are unintentional.  Patient: Alyssa Vega Type: Established DOB: 02/02/1956 MRN: [id number] PCP: Marisue Ivan, MD  Service: Procedure DOS: 04/20/2023 Setting: Ambulatory Location: Ambulatory outpatient facility Delivery: Face-to-face Provider: Oswaldo Done, MD Specialty: Interventional Pain Management Specialty designation: 09 Location: Outpatient facility Ref. Prov.: Marisue Ivan, MD       Interventional Therapy   Type:  Monovisc Intra-articular Knee Injection #1  Laterality: Bilateral (-50) Level/approach: Medial Imaging guidance: None required ([id number]) Anesthesia: Local anesthesia (1-2% Lidocaine) Anxiolysis: None                 Sedation: No Sedation                       DOS: 04/20/2023  Performed by: Oswaldo Done, MD  Purpose: Diagnostic/Therapeutic Indications: Knee arthralgia associated to osteoarthritis of the knee 1. Chronic knee pain (3ry area of Pain) (Bilateral) (R>L)   2. Osteoarthritis of knees (Bilateral)    NAS-11 score:   Pre-procedure: 8 /10   Post-procedure: 0 /10     Pre-Procedure Preparation  Monitoring: As per clinic protocol.  Risk Assessment: Vitals:  [id number] body mass index is 24.89 kg/m as calculated from the following:   Height as of this encounter: 5\' 4"  (1.626 m).   Weight as of this encounter: 145 lb (65.8 kg)., Rate:69 , BP:125/67, Resp: , Temp:98.4 F (36.9 C), SpO2:98 %  Allergies: She is allergic to duloxetine, atorvastatin, cyclobenzaprine, ibuprofen, melatonin, meloxicam, tizanidine, and venlafaxine.  Precautions: No additional precautions required   Blood-thinner(s): None at this time  Coagulopathies: Reviewed. None identified.   Active Infection(s): Reviewed. None identified. Alyssa Vega is afebrile   Location setting: Exam room Position: Sitting w/ knee bent 90 degrees Safety Precautions: Patient was assessed for positional comfort and pressure points before starting the procedure. Prepping solution: DuraPrep (Iodine Povacrylex [0.7% available iodine] and Isopropyl Alcohol, 74% w/w) Prep Area: Entire knee region Approach: percutaneous, just above the tibial plateau, lateral to the infrapatellar tendon. Intended target: Intra-articular knee space Materials: Tray: Block Needle(s): Regular Qty: 1/side Length: 1.5-inch Gauge: 25G (x1) + 22G (x1)  Meds ordered this encounter  Medications   pentafluoroprop-tetrafluoroeth (GEBAUERS) aerosol   lidocaine HCl (PF) (XYLOCAINE) 2 % injection 5 mL   ropivacaine (PF) 2 mg/mL (0.2%) (NAROPIN) injection 3 mL   Hyaluronan (MONOVISC) intra-articular injection 88 mg    Do not substitute for formulary alternative without first contacting physician for approval. Deliver to facility day before procedure.   Hyaluronan (MONOVISC) intra-articular injection 88 mg    Do not substitute for formulary alternative without first contacting physician for approval. Deliver to facility day before procedure.    Orders Placed This Encounter  Procedures   KNEE INJECTION    Indications: Knee arthralgia (pain) due to osteoarthritis (OA) Imaging: None (CPT-20610) Position: Sitting Equipment/Materials: Block tray  1.5", 25-G (one per side)  Local anesthetic  Monovisc (one per side) Confirm availability (in office) of Monovisc (HMW hyaluronan)    Scheduling Instructions:     Procedure: Knee injection Monovisc (Hyaluronan/Hyaluronic acid)     Treatment No.: 1     Level: Intra-articular     Laterality: Bilateral Knee     Sedation: Patient's choice.    Order Specific  Question:   Where will this procedure be  performed?    Answer:   [facility]   Informed Consent Details: Physician/Practitioner Attestation; Transcribe to consent form and obtain patient signature    Nursing Order: Transcribe to consent form and obtain patient signature. Note: Always confirm laterality of pain with Alyssa Vega, before procedure.    Order Specific Question:   Physician/Practitioner attestation of informed consent for procedure/surgical case    Answer:   I, the physician/practitioner, attest that I have discussed with the patient the benefits, risks, side effects, alternatives, likelihood of achieving goals and potential problems during recovery for the procedure that I have provided informed consent.    Order Specific Question:   Procedure    Answer:   Therapeutic intra-articular viscosupplementation knee injection    Order Specific Question:   Physician/Practitioner performing the procedure    Answer:   Youlanda Vega A. Laban Emperor, MD    Order Specific Question:   Indication/Reason    Answer:   Chronic knee pain secondary to primary osteoarthritis of the knee  (Right-M17.11), (Left-M17.12) or (Bilateral-M17.0)   Provide equipment / supplies at bedside    Procedure tray: "Block Tray" (Disposable  single use) Skin infiltration needle: Regular 1.5-in, 25-G, (x1) Block Needle type: Regular Amount/quantity: 2 Size: Short(1.5-inch) Gauge: (25G x1) + (22G x1)    Standing Status:   Standing    Number of Occurrences:   1    Order Specific Question:   Specify    Answer:   Block Tray     Time-out: 1333 I initiated and conducted the "Time-out" before starting the procedure, as per protocol. The patient was asked to participate by confirming the accuracy of the "Time Out" information. Verification of the correct person, site, and procedure were performed and confirmed by me, the nursing staff, and the patient. "Time-out" conducted as per Joint Commission's Universal Protocol (UP.01.01.01). Procedure checklist:  Completed  H&P (Pre-op  Assessment)  Alyssa Vega is a 67 y.o. (year old), female patient, seen today for interventional treatment. She  has a past surgical history that includes Ovarian cyst removal; Colonoscopy with propofol (N/A, 04/20/2020); and Esophagogastroduodenoscopy (egd) with propofol (N/A, 04/20/2020). Ms. Lafayette has a current medication list which includes the following prescription(s): acetaminophen, diclofenac, glucosamine-chondroitin, multivitamin, and pantoprazole. Her primarily concern today is the Knee Pain (both)  She is allergic to duloxetine, atorvastatin, cyclobenzaprine, ibuprofen, melatonin, meloxicam, tizanidine, and venlafaxine.   Last encounter: My last encounter with her was on 03/29/2023. Pertinent problems: Ms. Diane has Abdominal pain, generalized; Lower abdominal pain; Cervical radiculopathy; Chronic daily headache; LLQ pain; Chronic pain syndrome; Chronic low back pain (1ry area of Pain) (Bilateral) (R>L) w/o sciatica; Chronic hip pain (2ry area of Pain) (Bilateral) (L>R); Chronic knee pain (3ry area of Pain) (Bilateral) (R>L); Chronic neck and back pain (4th area of Pain) (Bilateral) (L>R); Chronic upper back pain (5th area of Pain) (Bilateral); Chronic shoulder pain (6th area of Pain) (Bilateral) (L>R); Cervicogenic headache (7th area of Pain) (Bilateral) (R>L); Cervico-occipital neuralgia (Right); Chronic feet pain (8th area of Pain) (Bilateral) (R>L); Abnormal MRI, lumbar spine (03/20/2021 & 01/23/2023); Abnormal MRI, cervical spine (03/20/2021); Cellulitis of forearm (Left); Traumatic hematoma of left forearm; Lumbar facet joint syndrome; Lumbar facet arthropathy (Multilevel) (Bilateral); Spondylosis without myelopathy or radiculopathy, cervical region; DDD (degenerative disc disease), cervical; Cervical foraminal stenosis (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-7); Cervical facet arthropathy (Multilevel) (Bilateral); Cervical facet syndrome; Grade 1 Retrolisthesis of C6/C7; Cervical facet  hypertrophy (C6-7); Spondylosis without myelopathy or radiculopathy, lumbosacral region; DDD (  degenerative disc disease), lumbosacral; DJD (degenerative joint disease), lumbosacral; Lumbar facet joint pain; Chronic lower extremity pain (Right); Lumbosacral radiculopathy at L4 (Right); Chronic radicular pain of lower extremity (Right); Chronic low back pain (Bilateral) (R>L) w/ sciatica (Right); and Osteoarthritis of knees (Bilateral) on their pertinent problem list. Pain Assessment: Severity of Chronic pain is reported as a 8 /10. Location: Knee Left, Right/Denies. Onset: More than a month ago. Quality: Aching, Constant, Burning, Sharp. Timing: Constant. Modifying factor(s): procedures and meds. Vitals:  height is 5\' 4"  (1.626 m) and weight is 145 lb (65.8 kg). Her temperature is 98.4 F (36.9 C). Her blood pressure is 125/67 and her pulse is 69. Her oxygen saturation is 98%.   Reason for encounter: "interventional pain management therapy due pain of at least four (4) weeks in duration, with failure to respond and/or inability to tolerate more conservative care.  Site Confirmation: Ms. Cathell was asked to confirm the procedure and laterality before marking the site.  Consent: Before the procedure and under the influence of no sedative(s), amnesic(s), or anxiolytics, the patient was informed of the treatment options, risks and possible complications. To fulfill our ethical and legal obligations, as recommended by the American Medical Association's Code of Ethics, I have informed the patient of my clinical impression; the nature and purpose of the treatment or procedure; the risks, benefits, and possible complications of the intervention; the alternatives, including doing nothing; the risk(s) and benefit(s) of the alternative treatment(s) or procedure(s); and the risk(s) and benefit(s) of doing nothing. The patient was provided information about the general risks and possible complications associated with the  procedure. These may include, but are not limited to: failure to achieve desired goals, infection, bleeding, organ or nerve damage, allergic reactions, paralysis, and death. In addition, the patient was informed of those risks and complications associated to Spine-related procedures, such as failure to decrease pain; infection (i.e.: Meningitis, epidural or intraspinal abscess); bleeding (i.e.: epidural hematoma, subarachnoid hemorrhage, or any other type of intraspinal or peri-dural bleeding); organ or nerve damage (i.e.: Any type of peripheral nerve, nerve root, or spinal cord injury) with subsequent damage to sensory, motor, and/or autonomic systems, resulting in permanent pain, numbness, and/or weakness of one or several areas of the body; allergic reactions; (i.e.: anaphylactic reaction); and/or death. Furthermore, the patient was informed of those risks and complications associated with the medications. These include, but are not limited to: allergic reactions (i.e.: anaphylactic or anaphylactoid reaction(s)); adrenal axis suppression; blood sugar elevation that in diabetics may result in ketoacidosis or comma; water retention that in patients with history of congestive heart failure may result in shortness of breath, pulmonary edema, and decompensation with resultant heart failure; weight gain; swelling or edema; medication-induced neural toxicity; particulate matter embolism and blood vessel occlusion with resultant organ, and/or nervous system infarction; and/or aseptic necrosis of one or more joints. Finally, the patient was informed that Medicine is not an exact science; therefore, there is also the possibility of unforeseen or unpredictable risks and/or possible complications that may result in a catastrophic outcome. The patient indicated having understood very clearly. We have given the patient no guarantees and we have made no promises. Enough time was given to the patient to ask questions, all of  which were answered to the patient's satisfaction. Ms. Backstrom has indicated that she wanted to continue with the procedure. Attestation: I, the ordering provider, attest that I have discussed with the patient the benefits, risks, side-effects, alternatives, likelihood of achieving goals, and potential problems  during recovery for the procedure that I have provided informed consent.  Date  Time: 04/20/2023  1:10 PM  Description of procedure  Start Time: 1333 hrs  Local Anesthesia: Once the patient was positioned, prepped, and time-out was completed. The target area was identified located. The skin was marked with an approved surgical skin marker. Once marked, the skin (epidermis, dermis, and hypodermis), and deeper tissues (fat, connective tissue and muscle) were infiltrated with a small amount of a short-acting local anesthetic, loaded on a 10cc syringe with a 25G, 1.5-in  Needle. An appropriate amount of time was allowed for local anesthetics to take effect before proceeding to the next step. Local Anesthetic: Lidocaine 1-2% The unused portion of the local anesthetic was discarded in the proper designated containers. Safety Precautions: Aspiration looking for blood return was conducted prior to all injections. At no point did I inject any substances, as a needle was being advanced. Before injecting, the patient was told to immediately notify me if she was experiencing any new onset of "ringing in the ears, or metallic taste in the mouth". No attempts were made at seeking any paresthesias. Safe injection practices and needle disposal techniques used. Medications properly checked for expiration dates. SDV (single dose vial) medications used. After the completion of the procedure, all disposable equipment used was discarded in the proper designated medical waste containers.  Technical description: Protocol guidelines were followed. After positioning, the target area was identified and prepped in the usual  manner. Skin & deeper tissues infiltrated with local anesthetic. Appropriate amount of time allowed to pass for local anesthetics to take effect. Proper needle placement secured. Once satisfactory needle placement was confirmed, I proceeded to inject the desired solution in slow, incremental fashion, intermittently assessing for discomfort or any signs of abnormal or undesired spread of substance. Once completed, the needle was removed and disposed of, as per hospital protocols. The area was cleaned, making sure to leave some of the prepping solution back to take advantage of its long term bactericidal properties.  Aspiration:  Negative        Vitals:   04/20/23 1306  BP: 125/67  Pulse: 69  Temp: 98.4 F (36.9 C)  SpO2: 98%  Weight: 145 lb (65.8 kg)  Height: 5\' 4"  (1.626 m)    End Time: 1337 hrs  Imaging guidance  Imaging-assisted Technique: None required. Indication(s): N/A Exposure Time: N/A Contrast: None Fluoroscopic Guidance: N/A Ultrasound Guidance: N/A Interpretation: N/A  Post-op assessment  Post-procedure Vital Signs:  Pulse/HCG Rate: 69  Temp: 98.4 F (36.9 C) Resp:   BP: 125/67 SpO2: 98 %  EBL: None  Complications: No immediate post-treatment complications observed by team, or reported by patient.  Note: The patient tolerated the entire procedure well. A repeat set of vitals were taken after the procedure and the patient was kept under observation following institutional policy, for this type of procedure. Post-procedural neurological assessment was performed, showing return to baseline, prior to discharge. The patient was provided with post-procedure discharge instructions, including a section on how to identify potential problems. Should any problems arise concerning this procedure, the patient was given instructions to immediately contact us, at any time, without hesitation. In any case, we plan to contact the patient by telephone for a follow-up status  report regarding this interventional procedure.  Comments:  No additional relevant information.  Plan of care  Chronic Opioid Analgesic:  None MME/day: 0 mg/day   Medications administered: We administered pentafluoroprop-tetrafluoroeth, lidocaine HCl (PF), ropivacaine (PF) 2 mg/mL (  0.2%), Hyaluronan, and Hyaluronan.  Follow-up plan:   Return in about 2 weeks (around 05/04/2023) for (Face2F), (PPE).      Interventional Therapies  Risk Factors  Considerations:    Most pain described to be from this level down except on right.    Planned  Pending:    Diagnostic/therapeutic right L4 TFESI #1 (NO STEROIDS) (done before on 03/19/2021 by Chasnis DO)    Under consideration:   Therapeutic left lumbar facet (L4-5, L5-S1) RFA #1  (NO STEROIDS)    Completed:   (01/02/2023) lumbar MRI ordered (01/02/2023) referral to neurosurgery for evaluation Therapeutic right lumbar L4-5, L5-S1 facet RFA x1 (no steroids) (11/24/2022) (100/100/100/(R)LBP:100  (R)LEP:0) Diagnostic bilateral (L4-5, L5-S1) Lumbar Facet (L3-S1) MBB x2 (10/18/2022) (1st: 100/100/50 x 3 day) (2nd: ...   Completed by other providers:   By Dr. Yves Dill Therapeutic right L4-5 TFESI x2 (03/19/2021) by Merri Ray, DO [facility] PMR)  Therapeutic right L5-S1 TFESI x1 (03/19/2021) by Merri Ray, DO [facility] PMR)  Therapeutic right C6-7 TFESI x3 (05/19/2020) by Merri Ray, DO [facility] PMR)  Therapeutic right C6-7 TFESI x1 (04/30/2021) by Merri Ray, DO [facility] PMR)  Therapeutic bilateral IA steroid knee inj. x1 (07/30/2021) by Lasandra Beech, PA ([facility])    Therapeutic  Palliative (PRN) options:   None established      Recent Visits Date Type Provider Dept  03/29/23 Office Visit Delano Metz, MD [facility]  Showing recent visits within past 90 days and meeting all other requirements Today's Visits Date Type Provider Dept  04/20/23 Procedure visit Delano Metz, MD [facility]  Showing  today's visits and meeting all other requirements Future Appointments Date Type Provider Dept  05/04/23 Appointment Delano Metz, MD [facility]  Showing future appointments within next 90 days and meeting all other requirements   Disposition: Discharge home  Discharge (Date  Time): 04/20/2023; 1339 hrs.   Primary Care Physician: Marisue Ivan, MD Location: [facility] Note by: Oswaldo Done, MD Date: 04/20/2023; Time: 1:47 PM  DISCLAIMER: Medicine is not an Visual merchandiser. It has no guarantees or warranties. The decision to proceed with this intervention was based on the information collected from the patient. Conclusions were drawn from the patient's questionnaire, interview, and examination. Because information was provided in large part by the patient, it cannot be guaranteed that it has not been purposely or unconsciously manipulated or altered. Every effort has been made to obtain as much accurate, relevant, available data as possible. Always take into account that the treatment will also be dependent on availability of resources and existing treatment guidelines, considered by other Pain Management Specialists as being common knowledge and practice, at the time of the intervention. It is also important to point out that variation in procedural techniques and pharmacological choices are the acceptable norm. For Medico-Legal review purposes, the indications, contraindications, technique, and results of the these procedures should only be evaluated, judged and interpreted by a Board-Certified Interventional Pain Specialist with extensive familiarity and expertise in the same exact procedure and technique.

## 2023-04-19 NOTE — Patient Instructions (Signed)

## 2023-04-20 ENCOUNTER — Encounter: Payer: Self-pay | Admitting: Pain Medicine

## 2023-04-20 ENCOUNTER — Ambulatory Visit: Payer: PPO | Attending: Pain Medicine | Admitting: Pain Medicine

## 2023-04-20 VITALS — BP 125/67 | HR 69 | Temp 98.4°F | Ht 64.0 in | Wt 145.0 lb

## 2023-04-20 DIAGNOSIS — M25561 Pain in right knee: Secondary | ICD-10-CM | POA: Diagnosis not present

## 2023-04-20 DIAGNOSIS — M25562 Pain in left knee: Secondary | ICD-10-CM | POA: Insufficient documentation

## 2023-04-20 DIAGNOSIS — G8929 Other chronic pain: Secondary | ICD-10-CM | POA: Diagnosis not present

## 2023-04-20 DIAGNOSIS — M170 Bilateral primary osteoarthritis of knee: Secondary | ICD-10-CM | POA: Insufficient documentation

## 2023-04-20 MED ORDER — PENTAFLUOROPROP-TETRAFLUOROETH EX AERO
INHALATION_SPRAY | Freq: Once | CUTANEOUS | Status: AC
  Administered 2023-04-20: 30 via TOPICAL

## 2023-04-20 MED ORDER — LIDOCAINE HCL (PF) 2 % IJ SOLN
5.0000 mL | Freq: Once | INTRAMUSCULAR | Status: AC
  Administered 2023-04-20: 5 mL
  Filled 2023-04-20: qty 5

## 2023-04-20 MED ORDER — HYALURONAN 88 MG/4ML IX SOSY
88.0000 mg | PREFILLED_SYRINGE | Freq: Once | INTRA_ARTICULAR | Status: AC
  Administered 2023-04-20: 88 mg via INTRA_ARTICULAR

## 2023-04-20 MED ORDER — ROPIVACAINE HCL 2 MG/ML IJ SOLN
3.0000 mL | Freq: Once | INTRAMUSCULAR | Status: AC
  Administered 2023-04-20: 3 mL via INTRA_ARTICULAR
  Filled 2023-04-20: qty 20

## 2023-04-20 NOTE — Progress Notes (Signed)
Safety precautions to be maintained throughout the outpatient stay will include: orient to surroundings, keep bed in low position, maintain call bell within reach at all times, provide assistance with transfer out of bed and ambulation.  

## 2023-04-21 ENCOUNTER — Telehealth: Payer: Self-pay | Admitting: *Deleted

## 2023-04-21 NOTE — Telephone Encounter (Signed)
Attempted to call for post procedure follow-up. Message left. 

## 2023-05-04 ENCOUNTER — Ambulatory Visit (HOSPITAL_BASED_OUTPATIENT_CLINIC_OR_DEPARTMENT_OTHER): Payer: PPO | Admitting: Pain Medicine

## 2023-05-04 DIAGNOSIS — Z91199 Patient's noncompliance with other medical treatment and regimen due to unspecified reason: Secondary | ICD-10-CM

## 2023-05-04 DIAGNOSIS — Z09 Encounter for follow-up examination after completed treatment for conditions other than malignant neoplasm: Secondary | ICD-10-CM

## 2023-05-04 NOTE — Progress Notes (Signed)
(  05/04/2023) Patient rescheduled today due to increased pain. She says she cannot drive here today and wants to reschedule.

## 2023-05-08 DIAGNOSIS — M79604 Pain in right leg: Secondary | ICD-10-CM | POA: Diagnosis not present

## 2023-05-08 DIAGNOSIS — G4700 Insomnia, unspecified: Secondary | ICD-10-CM | POA: Diagnosis not present

## 2023-05-08 DIAGNOSIS — G894 Chronic pain syndrome: Secondary | ICD-10-CM | POA: Diagnosis not present

## 2023-05-08 DIAGNOSIS — I83813 Varicose veins of bilateral lower extremities with pain: Secondary | ICD-10-CM | POA: Diagnosis not present

## 2023-05-08 DIAGNOSIS — M79605 Pain in left leg: Secondary | ICD-10-CM | POA: Diagnosis not present

## 2023-05-11 ENCOUNTER — Ambulatory Visit: Payer: PPO | Admitting: Pain Medicine

## 2023-06-13 DIAGNOSIS — M9903 Segmental and somatic dysfunction of lumbar region: Secondary | ICD-10-CM | POA: Diagnosis not present

## 2023-06-13 DIAGNOSIS — M9905 Segmental and somatic dysfunction of pelvic region: Secondary | ICD-10-CM | POA: Diagnosis not present

## 2023-06-13 DIAGNOSIS — M5416 Radiculopathy, lumbar region: Secondary | ICD-10-CM | POA: Diagnosis not present

## 2023-06-13 DIAGNOSIS — M4306 Spondylolysis, lumbar region: Secondary | ICD-10-CM | POA: Diagnosis not present

## 2023-06-14 DIAGNOSIS — M9903 Segmental and somatic dysfunction of lumbar region: Secondary | ICD-10-CM | POA: Diagnosis not present

## 2023-06-14 DIAGNOSIS — M5416 Radiculopathy, lumbar region: Secondary | ICD-10-CM | POA: Diagnosis not present

## 2023-06-14 DIAGNOSIS — M4306 Spondylolysis, lumbar region: Secondary | ICD-10-CM | POA: Diagnosis not present

## 2023-06-14 DIAGNOSIS — M9905 Segmental and somatic dysfunction of pelvic region: Secondary | ICD-10-CM | POA: Diagnosis not present

## 2023-06-19 DIAGNOSIS — M9905 Segmental and somatic dysfunction of pelvic region: Secondary | ICD-10-CM | POA: Diagnosis not present

## 2023-06-19 DIAGNOSIS — M5416 Radiculopathy, lumbar region: Secondary | ICD-10-CM | POA: Diagnosis not present

## 2023-06-19 DIAGNOSIS — M9903 Segmental and somatic dysfunction of lumbar region: Secondary | ICD-10-CM | POA: Diagnosis not present

## 2023-06-19 DIAGNOSIS — M4306 Spondylolysis, lumbar region: Secondary | ICD-10-CM | POA: Diagnosis not present

## 2023-06-21 DIAGNOSIS — M9905 Segmental and somatic dysfunction of pelvic region: Secondary | ICD-10-CM | POA: Diagnosis not present

## 2023-06-21 DIAGNOSIS — M5416 Radiculopathy, lumbar region: Secondary | ICD-10-CM | POA: Diagnosis not present

## 2023-06-21 DIAGNOSIS — M4306 Spondylolysis, lumbar region: Secondary | ICD-10-CM | POA: Diagnosis not present

## 2023-06-21 DIAGNOSIS — M9903 Segmental and somatic dysfunction of lumbar region: Secondary | ICD-10-CM | POA: Diagnosis not present

## 2023-06-22 DIAGNOSIS — M5416 Radiculopathy, lumbar region: Secondary | ICD-10-CM | POA: Diagnosis not present

## 2023-06-22 DIAGNOSIS — M4306 Spondylolysis, lumbar region: Secondary | ICD-10-CM | POA: Diagnosis not present

## 2023-06-22 DIAGNOSIS — M9905 Segmental and somatic dysfunction of pelvic region: Secondary | ICD-10-CM | POA: Diagnosis not present

## 2023-06-22 DIAGNOSIS — M9903 Segmental and somatic dysfunction of lumbar region: Secondary | ICD-10-CM | POA: Diagnosis not present

## 2023-06-26 DIAGNOSIS — M9903 Segmental and somatic dysfunction of lumbar region: Secondary | ICD-10-CM | POA: Diagnosis not present

## 2023-06-26 DIAGNOSIS — M9905 Segmental and somatic dysfunction of pelvic region: Secondary | ICD-10-CM | POA: Diagnosis not present

## 2023-06-26 DIAGNOSIS — M5416 Radiculopathy, lumbar region: Secondary | ICD-10-CM | POA: Diagnosis not present

## 2023-06-26 DIAGNOSIS — M4306 Spondylolysis, lumbar region: Secondary | ICD-10-CM | POA: Diagnosis not present

## 2023-06-27 DIAGNOSIS — M9905 Segmental and somatic dysfunction of pelvic region: Secondary | ICD-10-CM | POA: Diagnosis not present

## 2023-06-27 DIAGNOSIS — M4306 Spondylolysis, lumbar region: Secondary | ICD-10-CM | POA: Diagnosis not present

## 2023-06-27 DIAGNOSIS — M5416 Radiculopathy, lumbar region: Secondary | ICD-10-CM | POA: Diagnosis not present

## 2023-06-27 DIAGNOSIS — M9903 Segmental and somatic dysfunction of lumbar region: Secondary | ICD-10-CM | POA: Diagnosis not present

## 2023-06-28 DIAGNOSIS — M9905 Segmental and somatic dysfunction of pelvic region: Secondary | ICD-10-CM | POA: Diagnosis not present

## 2023-06-28 DIAGNOSIS — M9903 Segmental and somatic dysfunction of lumbar region: Secondary | ICD-10-CM | POA: Diagnosis not present

## 2023-06-28 DIAGNOSIS — M5416 Radiculopathy, lumbar region: Secondary | ICD-10-CM | POA: Diagnosis not present

## 2023-06-28 DIAGNOSIS — M4306 Spondylolysis, lumbar region: Secondary | ICD-10-CM | POA: Diagnosis not present

## 2023-07-03 DIAGNOSIS — M9903 Segmental and somatic dysfunction of lumbar region: Secondary | ICD-10-CM | POA: Diagnosis not present

## 2023-07-03 DIAGNOSIS — M4306 Spondylolysis, lumbar region: Secondary | ICD-10-CM | POA: Diagnosis not present

## 2023-07-03 DIAGNOSIS — M5416 Radiculopathy, lumbar region: Secondary | ICD-10-CM | POA: Diagnosis not present

## 2023-07-03 DIAGNOSIS — M9905 Segmental and somatic dysfunction of pelvic region: Secondary | ICD-10-CM | POA: Diagnosis not present

## 2023-07-05 DIAGNOSIS — M9905 Segmental and somatic dysfunction of pelvic region: Secondary | ICD-10-CM | POA: Diagnosis not present

## 2023-07-05 DIAGNOSIS — M4306 Spondylolysis, lumbar region: Secondary | ICD-10-CM | POA: Diagnosis not present

## 2023-07-05 DIAGNOSIS — M9903 Segmental and somatic dysfunction of lumbar region: Secondary | ICD-10-CM | POA: Diagnosis not present

## 2023-07-05 DIAGNOSIS — M5416 Radiculopathy, lumbar region: Secondary | ICD-10-CM | POA: Diagnosis not present

## 2023-07-10 DIAGNOSIS — M4306 Spondylolysis, lumbar region: Secondary | ICD-10-CM | POA: Diagnosis not present

## 2023-07-10 DIAGNOSIS — M9903 Segmental and somatic dysfunction of lumbar region: Secondary | ICD-10-CM | POA: Diagnosis not present

## 2023-07-10 DIAGNOSIS — M5416 Radiculopathy, lumbar region: Secondary | ICD-10-CM | POA: Diagnosis not present

## 2023-07-10 DIAGNOSIS — M9905 Segmental and somatic dysfunction of pelvic region: Secondary | ICD-10-CM | POA: Diagnosis not present

## 2023-07-24 DIAGNOSIS — M5416 Radiculopathy, lumbar region: Secondary | ICD-10-CM | POA: Diagnosis not present

## 2023-07-24 DIAGNOSIS — M4306 Spondylolysis, lumbar region: Secondary | ICD-10-CM | POA: Diagnosis not present

## 2023-07-24 DIAGNOSIS — M9905 Segmental and somatic dysfunction of pelvic region: Secondary | ICD-10-CM | POA: Diagnosis not present

## 2023-07-24 DIAGNOSIS — M9903 Segmental and somatic dysfunction of lumbar region: Secondary | ICD-10-CM | POA: Diagnosis not present

## 2023-07-26 DIAGNOSIS — M9903 Segmental and somatic dysfunction of lumbar region: Secondary | ICD-10-CM | POA: Diagnosis not present

## 2023-07-26 DIAGNOSIS — M4306 Spondylolysis, lumbar region: Secondary | ICD-10-CM | POA: Diagnosis not present

## 2023-07-26 DIAGNOSIS — M5416 Radiculopathy, lumbar region: Secondary | ICD-10-CM | POA: Diagnosis not present

## 2023-07-26 DIAGNOSIS — M9905 Segmental and somatic dysfunction of pelvic region: Secondary | ICD-10-CM | POA: Diagnosis not present

## 2023-07-31 DIAGNOSIS — M4306 Spondylolysis, lumbar region: Secondary | ICD-10-CM | POA: Diagnosis not present

## 2023-07-31 DIAGNOSIS — M5416 Radiculopathy, lumbar region: Secondary | ICD-10-CM | POA: Diagnosis not present

## 2023-07-31 DIAGNOSIS — M9903 Segmental and somatic dysfunction of lumbar region: Secondary | ICD-10-CM | POA: Diagnosis not present

## 2023-07-31 DIAGNOSIS — M9905 Segmental and somatic dysfunction of pelvic region: Secondary | ICD-10-CM | POA: Diagnosis not present

## 2023-08-02 DIAGNOSIS — M9905 Segmental and somatic dysfunction of pelvic region: Secondary | ICD-10-CM | POA: Diagnosis not present

## 2023-08-02 DIAGNOSIS — M4306 Spondylolysis, lumbar region: Secondary | ICD-10-CM | POA: Diagnosis not present

## 2023-08-02 DIAGNOSIS — M9903 Segmental and somatic dysfunction of lumbar region: Secondary | ICD-10-CM | POA: Diagnosis not present

## 2023-08-02 DIAGNOSIS — M5416 Radiculopathy, lumbar region: Secondary | ICD-10-CM | POA: Diagnosis not present

## 2023-08-09 DIAGNOSIS — M9905 Segmental and somatic dysfunction of pelvic region: Secondary | ICD-10-CM | POA: Diagnosis not present

## 2023-08-09 DIAGNOSIS — M5416 Radiculopathy, lumbar region: Secondary | ICD-10-CM | POA: Diagnosis not present

## 2023-08-09 DIAGNOSIS — M9903 Segmental and somatic dysfunction of lumbar region: Secondary | ICD-10-CM | POA: Diagnosis not present

## 2023-08-09 DIAGNOSIS — M4306 Spondylolysis, lumbar region: Secondary | ICD-10-CM | POA: Diagnosis not present

## 2023-08-14 ENCOUNTER — Telehealth (INDEPENDENT_AMBULATORY_CARE_PROVIDER_SITE_OTHER): Payer: Self-pay

## 2023-08-14 NOTE — Telephone Encounter (Signed)
Patient called stating she was having pain from her legs and to let say no one has called. Patient was last seen on 03/15/23 by Sheppard Plumber NP and the recommendation was for laser ablation per the office note. Patient was sent to front desk and told to leave a message if no answer and that I was also placing a message.

## 2023-09-07 ENCOUNTER — Telehealth (INDEPENDENT_AMBULATORY_CARE_PROVIDER_SITE_OTHER): Payer: Self-pay | Admitting: Vascular Surgery

## 2023-09-07 NOTE — Telephone Encounter (Signed)
 Via interpreter services, I LVM asking for a return call to schedule laser ablation appt.  Right GSV + SSV laser. see GS. auth # [id number] exp: 2.5.25 - 5.6.25

## 2023-09-11 ENCOUNTER — Telehealth (INDEPENDENT_AMBULATORY_CARE_PROVIDER_SITE_OTHER): Payer: Self-pay | Admitting: Vascular Surgery

## 2023-09-11 NOTE — Telephone Encounter (Signed)
 Via interpreter services, LVM for pt TCB and schedule appt  Right GSV + SSV laser. see GS. auth # [id number] exp: 2.5.25 - 5.6.25

## 2023-09-26 DIAGNOSIS — E7800 Pure hypercholesterolemia, unspecified: Secondary | ICD-10-CM | POA: Diagnosis not present

## 2023-09-26 DIAGNOSIS — Z8673 Personal history of transient ischemic attack (TIA), and cerebral infarction without residual deficits: Secondary | ICD-10-CM | POA: Diagnosis not present

## 2023-09-26 DIAGNOSIS — K760 Fatty (change of) liver, not elsewhere classified: Secondary | ICD-10-CM | POA: Diagnosis not present

## 2023-10-03 DIAGNOSIS — M79605 Pain in left leg: Secondary | ICD-10-CM | POA: Diagnosis not present

## 2023-10-03 DIAGNOSIS — K219 Gastro-esophageal reflux disease without esophagitis: Secondary | ICD-10-CM | POA: Diagnosis not present

## 2023-10-03 DIAGNOSIS — E7800 Pure hypercholesterolemia, unspecified: Secondary | ICD-10-CM | POA: Diagnosis not present

## 2023-10-03 DIAGNOSIS — G8929 Other chronic pain: Secondary | ICD-10-CM | POA: Diagnosis not present

## 2023-10-03 DIAGNOSIS — M79604 Pain in right leg: Secondary | ICD-10-CM | POA: Diagnosis not present

## 2023-10-13 ENCOUNTER — Other Ambulatory Visit (INDEPENDENT_AMBULATORY_CARE_PROVIDER_SITE_OTHER): Payer: Self-pay

## 2023-10-13 ENCOUNTER — Telehealth (INDEPENDENT_AMBULATORY_CARE_PROVIDER_SITE_OTHER): Payer: Self-pay | Admitting: Vascular Surgery

## 2023-10-13 NOTE — Telephone Encounter (Signed)
 Pt is scheduled for a laser ablation on 11/09/23 and will need the standard protocol RX sent to pharmacy on file. Thank you.

## 2023-10-16 ENCOUNTER — Other Ambulatory Visit (INDEPENDENT_AMBULATORY_CARE_PROVIDER_SITE_OTHER): Payer: Self-pay

## 2023-10-16 MED ORDER — ALPRAZOLAM 0.5 MG PO TABS: 0.5000 mg | ORAL_TABLET | ORAL | 0 refills | Status: AC

## 2023-10-16 NOTE — Telephone Encounter (Signed)
 Sent!

## 2023-10-23 ENCOUNTER — Telehealth (INDEPENDENT_AMBULATORY_CARE_PROVIDER_SITE_OTHER): Payer: Self-pay | Admitting: Vascular Surgery

## 2023-10-23 ENCOUNTER — Telehealth (INDEPENDENT_AMBULATORY_CARE_PROVIDER_SITE_OTHER): Payer: Self-pay

## 2023-10-23 NOTE — Telephone Encounter (Signed)
 Patient called in about having surgery in our office and needing to speak with someone. I explained she is not having surgery but a laser procedure and she would need to speak with Kenney Houseman regarding this. Patient stated she would call back or Kenney Houseman can reach out with an interpreter to speak with the patient.

## 2023-10-23 NOTE — Telephone Encounter (Signed)
 Patient had called into the interpreter services here at [facility] wanting someone to call her back regarding insurance for her upcoming laser ablation procedure. Marchelle Folks had given me the message on Friday 4.4.25 and I did not have time to call until this morning.Via interpreter services, a message was left for her to call back to the office with any questions she has.

## 2023-10-23 NOTE — Telephone Encounter (Signed)
 Via interpreter services, LVM for pt Alyssa Vega and speak with  me regarding any questions she has.

## 2023-10-24 ENCOUNTER — Telehealth (INDEPENDENT_AMBULATORY_CARE_PROVIDER_SITE_OTHER): Payer: Self-pay | Admitting: Vascular Surgery

## 2023-10-24 NOTE — Telephone Encounter (Signed)
 Via interpreter services, LVM for pt TCB and I would be happy to help with any questions.

## 2023-11-09 ENCOUNTER — Encounter (INDEPENDENT_AMBULATORY_CARE_PROVIDER_SITE_OTHER): Payer: Self-pay | Admitting: Vascular Surgery

## 2023-11-09 ENCOUNTER — Ambulatory Visit (INDEPENDENT_AMBULATORY_CARE_PROVIDER_SITE_OTHER): Admitting: Vascular Surgery

## 2023-11-09 VITALS — BP 114/63 | HR 72 | Resp 16 | Wt 149.0 lb

## 2023-11-09 DIAGNOSIS — I83811 Varicose veins of right lower extremities with pain: Secondary | ICD-10-CM | POA: Diagnosis not present

## 2023-11-09 DIAGNOSIS — I83819 Varicose veins of unspecified lower extremities with pain: Secondary | ICD-10-CM | POA: Insufficient documentation

## 2023-11-09 NOTE — Progress Notes (Signed)
    MRN : [id number]  Alecia A Bjorklund is a 68 y.o. (12/29/1955) female who presents with chief complaint of painful varicose veins.    The patient's right lower extremity was sterilely prepped and draped.  The ultrasound machine was used to visualize the right great saphenous vein throughout its course.  A segment below the knee was selected for access.  The saphenous vein was accessed without difficulty using ultrasound guidance with a micropuncture needle.   An 0.018  wire was placed beyond the saphenofemoral junction through the sheath and the microneedle was removed.  The 65 cm sheath was then placed over the wire and the wire and dilator were removed.  The laser fiber was placed through the sheath and its tip was placed approximately 2 cm below the saphenofemoral junction.  Tumescent anesthesia was then created with a dilute lidocaine  solution.  Laser energy was then delivered with constant withdrawal of the sheath and laser fiber.  Approximately 2310 Joules of energy were delivered over a length of 42 cm.  Sterile dressings were placed.  The patient tolerated the procedure well without complications.

## 2023-11-13 ENCOUNTER — Other Ambulatory Visit (INDEPENDENT_AMBULATORY_CARE_PROVIDER_SITE_OTHER): Payer: Self-pay | Admitting: Vascular Surgery

## 2023-11-13 DIAGNOSIS — M79605 Pain in left leg: Secondary | ICD-10-CM

## 2023-11-13 DIAGNOSIS — I83813 Varicose veins of bilateral lower extremities with pain: Secondary | ICD-10-CM

## 2023-11-16 ENCOUNTER — Ambulatory Visit (INDEPENDENT_AMBULATORY_CARE_PROVIDER_SITE_OTHER)

## 2023-11-16 ENCOUNTER — Encounter (INDEPENDENT_AMBULATORY_CARE_PROVIDER_SITE_OTHER)

## 2023-11-16 DIAGNOSIS — M79605 Pain in left leg: Secondary | ICD-10-CM | POA: Diagnosis not present

## 2023-11-16 DIAGNOSIS — I83813 Varicose veins of bilateral lower extremities with pain: Secondary | ICD-10-CM

## 2023-12-07 ENCOUNTER — Encounter (INDEPENDENT_AMBULATORY_CARE_PROVIDER_SITE_OTHER): Payer: Self-pay | Admitting: Nurse Practitioner

## 2023-12-07 ENCOUNTER — Ambulatory Visit (INDEPENDENT_AMBULATORY_CARE_PROVIDER_SITE_OTHER): Admitting: Nurse Practitioner

## 2023-12-07 VITALS — BP 130/76 | HR 74 | Resp 16 | Wt 148.2 lb

## 2023-12-07 DIAGNOSIS — E7800 Pure hypercholesterolemia, unspecified: Secondary | ICD-10-CM | POA: Diagnosis not present

## 2023-12-07 DIAGNOSIS — M5441 Lumbago with sciatica, right side: Secondary | ICD-10-CM | POA: Diagnosis not present

## 2023-12-07 DIAGNOSIS — G8929 Other chronic pain: Secondary | ICD-10-CM | POA: Diagnosis not present

## 2023-12-07 DIAGNOSIS — I83813 Varicose veins of bilateral lower extremities with pain: Secondary | ICD-10-CM

## 2023-12-11 ENCOUNTER — Encounter (INDEPENDENT_AMBULATORY_CARE_PROVIDER_SITE_OTHER): Payer: Self-pay | Admitting: Nurse Practitioner

## 2023-12-11 NOTE — Progress Notes (Signed)
 Subjective:    Patient ID: Alyssa Vega, female    DOB: 25-Oct-1955, 68 y.o.   MRN: [id number] Chief Complaint  Patient presents with   Follow-up    4 week post laser    The patient returns to the office for followup status post laser ablation of the right great and small saphenous vein on 11/09/2023.  The patient note s that she has not had any significant improvement in her pain or discomfort of her lower extremities following her endovenous laser ablation.  She notes her legs ache and throb all day constantly.  Has not been any slight improvement whatsoever.  She does have a significant history of lower back pain however she notes that she has been told that her cause for her pain is her circulation.  The patient is otherwise done well and there have been no complications related to the laser procedure or interval changes in the patient's overall   Post laser ultrasound shows successful ablation of the right great saphenous vein     Review of Systems  Musculoskeletal:  Positive for arthralgias and back pain.  All other systems reviewed and are negative.      Objective:    Physical Exam Vitals reviewed.  HENT:     Head: Normocephalic.  Cardiovascular:     Rate and Rhythm: Normal rate.     Pulses: Normal pulses.  Pulmonary:     Effort: Pulmonary effort is normal.  Skin:    General: Skin is warm and dry.  Neurological:     Mental Status: She is alert and oriented to person, place, and time.  Psychiatric:        Mood and Affect: Mood normal.        Behavior: Behavior normal.        Thought Content: Thought content normal.        Judgment: Judgment normal.     BP 130/76   Pulse 74   Resp 16   Wt 148 lb 3.2 oz (67.2 kg)   BMI 25.44 kg/m   Past Medical History:  Diagnosis Date   Arthritis     Social History   Socioeconomic History   Marital status: Divorced    Spouse name: Not on file   Number of children: Not on file   Years of education: Not on file    Highest education level: Not on file  Occupational History   Not on file  Tobacco Use   Smoking status: Never   Smokeless tobacco: Never  Vaping Use   Vaping status: Never Used  Substance and Sexual Activity   Alcohol use: No   Drug use: No   Sexual activity: Not on file  Other Topics Concern   Not on file  Social History Narrative   Not on file   Social Drivers of Health   Financial Resource Strain: Low Risk  (03/24/2023)   Received from [facility]   Overall Financial Resource Strain (CARDIA)    Difficulty of Paying Living Expenses: Not hard at all  Food Insecurity: No Food Insecurity (03/24/2023)   Received from [facility]   Hunger Vital Sign    Worried About Running Out of Food in the Last Year: Never true    Ran Out of Food in the Last Year: Never true  Transportation Needs: No Transportation Needs (03/24/2023)   Received from [facility]    In the past 12 months,  has lack of transportation kept you from medical appointments or from getting medications?: No    Lack of Transportation (Non-Medical): No  Physical Activity: Not on file  Stress: Not on file  Social Connections: Not on file  Intimate Partner Violence: Not on file    Past Surgical History:  Procedure Laterality Date   COLONOSCOPY WITH PROPOFOL  N/A 04/20/2020   Procedure: COLONOSCOPY WITH PROPOFOL ;  Surgeon: Shane Darling, MD;  Location: [facility] ENDOSCOPY;  Service: Endoscopy;  Laterality: N/A;   ESOPHAGOGASTRODUODENOSCOPY (EGD) WITH PROPOFOL  N/A 04/20/2020   Procedure: ESOPHAGOGASTRODUODENOSCOPY (EGD) WITH PROPOFOL ;  Surgeon: Shane Darling, MD;  Location: [facility] ENDOSCOPY;  Service: Endoscopy;  Laterality: N/A;   OVARIAN CYST REMOVAL      Family History  Problem Relation Age of Onset   Breast cancer Neg Hx     Allergies  Allergen Reactions   Duloxetine     hyponatremia   Atorvastatin Itching   Cyclobenzaprine  Other (See Comments)    Sores in mouth/throat   Ibuprofen Other (See Comments)    Mouth sores   Melatonin Other (See Comments)    Stomach pain   Meloxicam Other (See Comments)    Sores/ulcerations mouth/tongue/throat   Tizanidine Other (See Comments)    Nightmares, insomnia    Venlafaxine Other (See Comments)    Did not feel well at all, very lethargic        Latest Ref Rng & Units 10/30/2022   11:49 AM 09/05/2022    5:06 PM 02/11/2022    6:27 PM  CBC  WBC 4.0 - 10.5 K/uL 8.6  5.3  5.6   Hemoglobin 12.0 - 15.0 g/dL 16.1  09.6  04.5   Hematocrit 36.0 - 46.0 % 46.3  39.1  41.7   Platelets 150 - 400 K/uL 227  244  245        CMP     Component Value Date/Time   NA 135 10/30/2022 1149   K 3.6 10/30/2022 1149   CL 103 10/30/2022 1149   CO2 25 10/30/2022 1149   GLUCOSE 89 10/30/2022 1149   BUN 8 10/30/2022 1149   CREATININE 0.58 10/30/2022 1149   CALCIUM 8.3 (L) 10/30/2022 1149   PROT 6.4 (L) 10/30/2022 1149   ALBUMIN 3.7 10/30/2022 1149   AST 35 10/30/2022 1149   ALT 32 10/30/2022 1149   ALKPHOS 56 10/30/2022 1149   BILITOT 0.5 10/30/2022 1149   GFRNONAA >60 10/30/2022 1149     No results found.     Assessment & Plan:   1. Varicose veins of both lower extremities with pain (Primary) The patient underwent treatment of her varicosities on her right leg with very little to no improvement.  I had a long discussion regarding the type of pain that she is experiencing and that I suspect it is related to her lower back.  She has been told that it is her circulation.  She has strongly palpable pulses but we have not yet formally evaluated her arterial perfusion.  Will have this done at her upcoming follow-up.  I have discussed with the patient that if these prove normal treatment of her left lower extremity may not yield any significant results either.  2. 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   We will have her return with ABIs as this has not yet been evaluated.  If these are normal, she will continue to follow with her PCP  for further workup  3. Pure hypercholesterolemia Continue statin as ordered and reviewed, no changes at this time   Current Outpatient Medications on File Prior to Visit  Medication Sig Dispense Refill   acetaminophen  (TYLENOL ) 500 MG tablet Take 500 mg by mouth every 6 (six) hours as needed.     alendronate (FOSAMAX) 70 MG tablet Take 70 mg by mouth once a week.     diclofenac (VOLTAREN) 75 MG EC tablet Take 75 mg by mouth as needed.     ezetimibe (ZETIA) 10 MG tablet Take 10 mg by mouth daily.     glucosamine-chondroitin 500-400 MG tablet Take 1 tablet by mouth daily.     Multiple Vitamin (MULTIVITAMIN) tablet Take 1 tablet by mouth daily.     ALPRAZolam  (XANAX ) 0.5 MG tablet Take 1 tablet (0.5 mg total) by mouth as directed. Take 1 tablet 1 hour before arrival and 1 tablet after arriving at the office (Patient not taking: Reported on 12/07/2023) 2 tablet 0   pantoprazole  (PROTONIX ) 20 MG tablet Take 20 mg by mouth daily. (Patient not taking: Reported on 12/07/2023)     No current facility-administered medications on file prior to visit.    There are no Patient Instructions on file for this visit. No follow-ups on file.   Milos Milligan E Honestee Revard, NP

## 2023-12-26 DIAGNOSIS — G894 Chronic pain syndrome: Secondary | ICD-10-CM | POA: Diagnosis not present

## 2023-12-26 DIAGNOSIS — R1032 Left lower quadrant pain: Secondary | ICD-10-CM | POA: Diagnosis not present

## 2023-12-26 DIAGNOSIS — G8929 Other chronic pain: Secondary | ICD-10-CM | POA: Diagnosis not present

## 2023-12-26 DIAGNOSIS — M545 Low back pain, unspecified: Secondary | ICD-10-CM | POA: Diagnosis not present

## 2024-01-02 DIAGNOSIS — Z1331 Encounter for screening for depression: Secondary | ICD-10-CM | POA: Diagnosis not present

## 2024-01-02 DIAGNOSIS — R519 Headache, unspecified: Secondary | ICD-10-CM | POA: Diagnosis not present

## 2024-01-02 DIAGNOSIS — R42 Dizziness and giddiness: Secondary | ICD-10-CM | POA: Diagnosis not present

## 2024-01-02 DIAGNOSIS — M5431 Sciatica, right side: Secondary | ICD-10-CM | POA: Diagnosis not present

## 2024-01-08 DIAGNOSIS — M79606 Pain in leg, unspecified: Secondary | ICD-10-CM | POA: Insufficient documentation

## 2024-01-08 NOTE — Progress Notes (Deleted)
 MRN : [id number]  Alyssa Vega is a 68 y.o. (August 31, 1955) female who presents with chief complaint of check circulation.  History of Present Illness:   The patient is seen for evaluation of painful lower extremities primarily the right lower extremity.  She has a known history of degenerative lumbar spine disease.  She has a history of right lower extremity varicosities and is  status post laser ablation of the right great and small saphenous vein on 11/09/2023.  The patient noted in her initial follow-up post laser that she has not had any significant improvement in her pain or discomfort of her lower extremities following her endovenous laser ablation.  She notes her legs ache and throb all day constantly.     Post laser ultrasound shows successful ablation of the right great saphenous vein.   Patient notes the pain is variable and not always associated with activity.  The pain is somewhat consistent day to day occurring on most days. The patient notes the pain also occurs with standing or sitting for long periods.  The extremity pain routinely seems worse as the day wears on. The pain has been progressive over the past several years. The patient states these symptoms are causing a negative impact on quality of life and daily activities which was a factor in the evaluation.  The patient has a history of back problems and DJD of the lumbar and sacral spine.   The patient denies rest pain or dangling of an extremity off the side of the bed during the night for relief. No open wounds or sores at this time. No history of DVT or phlebitis. No prior vascular interventions or surgeries.   No outpatient medications have been marked as taking for the 01/11/24 encounter (Appointment) with Jama, Cordella MATSU, MD.    Past Medical History:  Diagnosis Date   Arthritis     Past Surgical History:  Procedure Laterality Date    COLONOSCOPY WITH PROPOFOL  N/A 04/20/2020   Procedure: COLONOSCOPY WITH PROPOFOL ;  Surgeon: Maryruth Ole DASEN, MD;  Location[facility] ENDOSCOPY;  Service: Endoscopy;  Laterality: N/A;   ESOPHAGOGASTRODUODENOSCOPY (EGD) WITH PROPOFOL  N/A 04/20/2020   Procedure: ESOPHAGOGASTRODUODENOSCOPY (EGD) WITH PROPOFOL ;  Surgeon: Maryruth Ole DASEN, MD;  Location: [facility] ENDOSCOPY;  Service: Endoscopy;  Laterality: N/A;   OVARIAN CYST REMOVAL      Social History Social History   Tobacco Use   Smoking status: Never   Smokeless tobacco: Never  Vaping Use   Vaping status: Never Used  Substance Use Topics   Alcohol use: No   Drug use: No    Family History Family History  Problem Relation Age of Onset   Breast cancer Neg Hx     Allergies  Allergen Reactions   Duloxetine     hyponatremia   Atorvastatin Itching   Cyclobenzaprine Other (See Comments)    Sores in mouth/throat   Ibuprofen Other (See Comments)    Mouth sores   Melatonin Other (See Comments)    Stomach pain   Meloxicam Other (See Comments)    Sores/ulcerations mouth/tongue/throat   Tizanidine Other (See  Comments)    Nightmares, insomnia    Venlafaxine Other (See Comments)    Did not feel well at all, very lethargic      REVIEW OF SYSTEMS (Negative unless checked)  Constitutional: [] Weight loss  [] Fever  [] Chills Cardiac: [] Chest pain   [] Chest pressure   [] Palpitations   [] Shortness of breath when laying flat   [] Shortness of breath with exertion. Vascular:  [x] Pain in legs with walking   [x] Pain in legs at rest  [] History of DVT   [] Phlebitis   [] Swelling in legs   [x] Varicose veins   [] Non-healing ulcers Pulmonary:   [] Uses home oxygen   [] Productive cough   [] Hemoptysis   [] Wheeze  [] COPD   [] Asthma Neurologic:  [] Dizziness   [] Seizures   [] History of stroke   [] History of TIA  [] Aphasia   [] Vissual changes   [] Weakness or numbness in arm   [x] Weakness or numbness in leg Musculoskeletal:   [] Joint swelling   [x] Joint pain    [x] Low back pain Hematologic:  [] Easy bruising  [] Easy bleeding   [] Hypercoagulable state   [] Anemic Gastrointestinal:  [] Diarrhea   [] Vomiting  [x] Gastroesophageal reflux/heartburn   [] Difficulty swallowing. Genitourinary:  [] Chronic kidney disease   [] Difficult urination  [] Frequent urination   [] Blood in urine Skin:  [] Rashes   [] Ulcers  Psychological:  [x] History of anxiety   []  History of major depression.  Physical Examination  There were no vitals filed for this visit. There is no height or weight on file to calculate BMI. Gen: WD/WN, NAD Head: [place]/AT, No temporalis wasting.  Ear/Nose/Throat: Hearing grossly intact, nares w/o erythema or drainage Eyes: PER, EOMI, sclera nonicteric.  Neck: Supple, no masses.  No bruit or JVD.  Pulmonary:  Good air movement, no audible wheezing, no use of accessory muscles.  Cardiac: RRR, normal S1, S2, no Murmurs. Vascular:  mild trophic changes, no open wounds Vessel Right Left  Radial Palpable Palpable  PT Not Palpable Not Palpable  DP Not Palpable Not Palpable  Gastrointestinal: soft, non-distended. No guarding/no peritoneal signs.  Musculoskeletal: M/S 5/5 throughout.  No visible deformity.  Neurologic: CN 2-12 intact. Pain and light touch intact in extremities.  Symmetrical.  Speech is fluent. Motor exam as listed above. Psychiatric: Judgment intact, Mood & affect appropriate for pt's clinical situation. Dermatologic: No rashes or ulcers noted.  No changes consistent with cellulitis.   CBC Lab Results  Component Value Date   WBC 8.6 10/30/2022   HGB 15.5 (H) 10/30/2022   HCT 46.3 (H) 10/30/2022   MCV 90.1 10/30/2022   PLT 227 10/30/2022    BMET    Component Value Date/Time   NA 135 10/30/2022 1149   K 3.6 10/30/2022 1149   CL 103 10/30/2022 1149   CO2 25 10/30/2022 1149   GLUCOSE 89 10/30/2022 1149   BUN 8 10/30/2022 1149   CREATININE 0.58 10/30/2022 1149   CALCIUM 8.3 (L) 10/30/2022 1149   GFRNONAA >60 10/30/2022 1149    GFRAA >60 04/05/2020 1425   CrCl cannot be calculated (Patient's most recent lab result is older than the maximum 21 days allowed.).  COAG No results found for: INR, PROTIME  Radiology No results found.   Assessment/Plan There are no diagnoses linked to this encounter.   Cordella Shawl, MD  01/08/2024 11:16 AM

## 2024-01-09 ENCOUNTER — Other Ambulatory Visit (INDEPENDENT_AMBULATORY_CARE_PROVIDER_SITE_OTHER): Payer: Self-pay | Admitting: Nurse Practitioner

## 2024-01-09 DIAGNOSIS — M79606 Pain in leg, unspecified: Secondary | ICD-10-CM

## 2024-01-11 ENCOUNTER — Ambulatory Visit (INDEPENDENT_AMBULATORY_CARE_PROVIDER_SITE_OTHER): Admitting: Vascular Surgery

## 2024-01-11 ENCOUNTER — Encounter (INDEPENDENT_AMBULATORY_CARE_PROVIDER_SITE_OTHER)

## 2024-01-11 DIAGNOSIS — I83819 Varicose veins of unspecified lower extremities with pain: Secondary | ICD-10-CM

## 2024-01-11 DIAGNOSIS — M79604 Pain in right leg: Secondary | ICD-10-CM

## 2024-01-11 DIAGNOSIS — M47817 Spondylosis without myelopathy or radiculopathy, lumbosacral region: Secondary | ICD-10-CM

## 2024-01-11 DIAGNOSIS — E7800 Pure hypercholesterolemia, unspecified: Secondary | ICD-10-CM

## 2024-01-11 DIAGNOSIS — M25562 Pain in left knee: Secondary | ICD-10-CM | POA: Diagnosis not present

## 2024-02-22 ENCOUNTER — Encounter (INDEPENDENT_AMBULATORY_CARE_PROVIDER_SITE_OTHER): Payer: Self-pay | Admitting: Vascular Surgery

## 2024-02-22 ENCOUNTER — Other Ambulatory Visit (INDEPENDENT_AMBULATORY_CARE_PROVIDER_SITE_OTHER)

## 2024-02-22 ENCOUNTER — Ambulatory Visit (INDEPENDENT_AMBULATORY_CARE_PROVIDER_SITE_OTHER): Admitting: Vascular Surgery

## 2024-02-22 VITALS — BP 105/65 | HR 67 | Resp 18

## 2024-02-22 DIAGNOSIS — I83819 Varicose veins of unspecified lower extremities with pain: Secondary | ICD-10-CM | POA: Diagnosis not present

## 2024-02-22 DIAGNOSIS — E7800 Pure hypercholesterolemia, unspecified: Secondary | ICD-10-CM | POA: Diagnosis not present

## 2024-02-22 DIAGNOSIS — M79606 Pain in leg, unspecified: Secondary | ICD-10-CM | POA: Diagnosis not present

## 2024-02-22 DIAGNOSIS — M51372 Other intervertebral disc degeneration, lumbosacral region with discogenic back pain and lower extremity pain: Secondary | ICD-10-CM | POA: Diagnosis not present

## 2024-02-22 NOTE — Progress Notes (Signed)
 MRN : [id number]  Alyssa Vega is a 68 y.o. (25-Sep-1955) female who presents with chief complaint of check circulation.  History of Present Illness:   The patient is seen for evaluation of painful lower extremities. Patient notes the pain is variable and not always associated with activity.  The pain is somewhat consistent day to day occurring on most days. The patient notes the pain also occurs with standing or sitting for long periods.  The extremity pain routinely seems worse as the day wears on. The pain has been progressive over the past several years. The patient states these symptoms are causing a negative impact on quality of life and daily activities which was a factor in the evaluation.  The patient has a history of back problems and DJD of the lumbar and sacral spine.   The patient denies rest pain or dangling of an extremity off the side of the bed during the night for relief. No open wounds or sores at this time. No history of DVT or phlebitis. No prior vascular interventions or surgeries.   Current Meds  Medication Sig   acetaminophen  (TYLENOL ) 500 MG tablet Take 500 mg by mouth every 6 (six) hours as needed.   alendronate (FOSAMAX) 70 MG tablet Take 70 mg by mouth once a week.   butalbital-acetaminophen -caffeine (FIORICET) 50-325-40 MG tablet Take 1 tablet by mouth every 6 (six) hours as needed for headache or migraine.   diclofenac (VOLTAREN) 75 MG EC tablet Take 75 mg by mouth as needed.   ezetimibe (ZETIA) 10 MG tablet Take 10 mg by mouth daily.   glucosamine-chondroitin 500-400 MG tablet Take 1 tablet by mouth daily.   Multiple Vitamin (MULTIVITAMIN) tablet Take 1 tablet by mouth daily.   pantoprazole  (PROTONIX ) 20 MG tablet Take 20 mg by mouth daily.    Past Medical History:  Diagnosis Date   Arthritis     Past Surgical History:  Procedure Laterality Date   COLONOSCOPY WITH PROPOFOL  N/A  04/20/2020   Procedure: COLONOSCOPY WITH PROPOFOL ;  Surgeon: Maryruth Ole DASEN, MD;  Location: [facility] ENDOSCOPY;  Service: Endoscopy;  Laterality: N/A;   ESOPHAGOGASTRODUODENOSCOPY (EGD) WITH PROPOFOL  N/A 04/20/2020   Procedure: ESOPHAGOGASTRODUODENOSCOPY (EGD) WITH PROPOFOL ;  Surgeon: Maryruth Ole DASEN, MD;  Location: [facility] ENDOSCOPY;  Service: Endoscopy;  Laterality: N/A;   OVARIAN CYST REMOVAL      Social History Social History   Tobacco Use   Smoking status: Never   Smokeless tobacco: Never  Vaping Use   Vaping status: Never Used  Substance Use Topics   Alcohol use: No   Drug use: No    Family History Family History  Problem Relation Age of Onset   Breast cancer Neg Hx     Allergies  Allergen Reactions   Duloxetine     hyponatremia   Atorvastatin Itching   Cyclobenzaprine Other (See Comments)    Sores in mouth/throat   Ibuprofen Other (See Comments)    Mouth sores   Melatonin Other (See Comments)    Stomach pain   Meloxicam Other (See Comments)    Sores/ulcerations mouth/tongue/throat  Tizanidine Other (See Comments)    Nightmares, insomnia    Venlafaxine Other (See Comments)    Did not feel well at all, very lethargic      REVIEW OF SYSTEMS (Negative unless checked)  Constitutional: [] Weight loss  [] Fever  [] Chills Cardiac: [] Chest pain   [] Chest pressure   [] Palpitations   [] Shortness of breath when laying flat   [] Shortness of breath with exertion. Vascular:  [x] Pain in legs with walking   [] Pain in legs at rest  [] History of DVT   [] Phlebitis   [] Swelling in legs   [] Varicose veins   [] Non-healing ulcers Pulmonary:   [] Uses home oxygen   [] Productive cough   [] Hemoptysis   [] Wheeze  [] COPD   [] Asthma Neurologic:  [] Dizziness   [] Seizures   [] History of stroke   [] History of TIA  [] Aphasia   [] Vissual changes   [] Weakness or numbness in arm   [] Weakness or numbness in leg Musculoskeletal:   [] Joint swelling   [] Joint pain   [] Low back pain Hematologic:   [] Easy bruising  [] Easy bleeding   [] Hypercoagulable state   [] Anemic Gastrointestinal:  [] Diarrhea   [] Vomiting  [] Gastroesophageal reflux/heartburn   [] Difficulty swallowing. Genitourinary:  [] Chronic kidney disease   [] Difficult urination  [] Frequent urination   [] Blood in urine Skin:  [] Rashes   [] Ulcers  Psychological:  [] History of anxiety   []  History of major depression.  Physical Examination  Vitals:   02/22/24 1517  BP: 105/65  Pulse: 67  Resp: 18   There is no height or weight on file to calculate BMI. Gen: WD/WN, NAD Head: [place]/AT, No temporalis wasting.  Ear/Nose/Throat: Hearing grossly intact, nares w/o erythema or drainage Eyes: PER, EOMI, sclera nonicteric.  Neck: Supple, no masses.  No bruit or JVD.  Pulmonary:  Good air movement, no audible wheezing, no use of accessory muscles.  Cardiac: RRR, normal S1, S2, no Murmurs. Vascular:  mild trophic changes, no open wounds Vessel Right Left  Radial Palpable Palpable  PT Palpable  Palpable  DP Palpable  Palpable  Gastrointestinal: soft, non-distended. No guarding/no peritoneal signs.  Musculoskeletal: M/S 5/5 throughout.  No visible deformity.  Neurologic: CN 2-12 intact. Pain and light touch intact in extremities.  Symmetrical.  Speech is fluent. Motor exam as listed above. Psychiatric: Judgment intact, Mood & affect appropriate for pt's clinical situation. Dermatologic: No rashes or ulcers noted.  No changes consistent with cellulitis.   CBC Lab Results  Component Value Date   WBC 8.6 10/30/2022   HGB 15.5 (H) 10/30/2022   HCT 46.3 (H) 10/30/2022   MCV 90.1 10/30/2022   PLT 227 10/30/2022    BMET    Component Value Date/Time   NA 135 10/30/2022 1149   K 3.6 10/30/2022 1149   CL 103 10/30/2022 1149   CO2 25 10/30/2022 1149   GLUCOSE 89 10/30/2022 1149   BUN 8 10/30/2022 1149   CREATININE 0.58 10/30/2022 1149   CALCIUM 8.3 (L) 10/30/2022 1149   GFRNONAA >60 10/30/2022 1149   GFRAA >60 04/05/2020 1425    CrCl cannot be calculated (Patient's most recent lab result is older than the maximum 21 days allowed.).  COAG No results found for: INR, PROTIME  Radiology No results found.   Assessment/Plan 1. Degeneration of intervertebral disc of lumbosacral region with discogenic back pain and lower extremity pain (Primary) Recommend:  The patient has atypical pain symptoms for vascular disease and on exam I do not find evidence of vascular pathology that would explain the  patient's symptoms.  Noninvasive studies do not identify significant vascular problems  I suspect the patient is c/o pseudoclaudication.  Patient should have an evaluation of the LS spine which I defer to the primary service, pain management or the Spine service.  The patient should continue walking and begin a more formal exercise program. The patient should continue his antiplatelet therapy and aggressive treatment of the lipid abnormalities.  Patient will follow-up with me on a PRN basis. - Ambulatory referral to Neurosurgery  2. Varicose veins with pain Recommend:  The patient is complaining of varicose veins.    I have had a long discussion with the patient regarding  varicose veins and why they cause symptoms.  Patient will begin wearing graduated compression stockings on a daily basis, beginning first thing in the morning and removing them in the evening. The patient is instructed specifically not to sleep in the stockings.    The patient  will also begin using over-the-counter analgesics such as Motrin 600 mg po TID to help control the symptoms as needed.    In addition, behavioral modification including elevation during the day will be initiated, utilizing a recliner was recommended.  The patient is also instructed to continue exercising such as walking 4-5 times per week.  At this time the patient wishes to continue conservative therapy and is not interested in more invasive treatments such as laser  ablation and sclerotherapy.  The Patient will follow up PRN if the symptoms worsen.  3. Pure hypercholesterolemia Continue statin as ordered and reviewed, no changes at this time    Cordella Shawl, MD  02/22/2024 3:25 PM

## 2024-02-24 ENCOUNTER — Encounter (INDEPENDENT_AMBULATORY_CARE_PROVIDER_SITE_OTHER): Payer: Self-pay | Admitting: Vascular Surgery

## 2024-02-26 LAB — VAS US ABI WITH/WO TBI
Left ABI: 1.2
Right ABI: 1.11

## 2024-03-06 DIAGNOSIS — R42 Dizziness and giddiness: Secondary | ICD-10-CM | POA: Diagnosis not present

## 2024-03-06 DIAGNOSIS — M5431 Sciatica, right side: Secondary | ICD-10-CM | POA: Diagnosis not present

## 2024-03-06 DIAGNOSIS — R519 Headache, unspecified: Secondary | ICD-10-CM | POA: Diagnosis not present

## 2024-03-06 DIAGNOSIS — M542 Cervicalgia: Secondary | ICD-10-CM | POA: Diagnosis not present

## 2024-03-28 DIAGNOSIS — K219 Gastro-esophageal reflux disease without esophagitis: Secondary | ICD-10-CM | POA: Diagnosis not present

## 2024-03-28 DIAGNOSIS — E7800 Pure hypercholesterolemia, unspecified: Secondary | ICD-10-CM | POA: Diagnosis not present

## 2024-04-04 DIAGNOSIS — Z599 Problem related to housing and economic circumstances, unspecified: Secondary | ICD-10-CM | POA: Diagnosis not present

## 2024-04-04 DIAGNOSIS — E7800 Pure hypercholesterolemia, unspecified: Secondary | ICD-10-CM | POA: Diagnosis not present

## 2024-04-04 DIAGNOSIS — Z0000 Encounter for general adult medical examination without abnormal findings: Secondary | ICD-10-CM | POA: Diagnosis not present

## 2024-04-09 ENCOUNTER — Ambulatory Visit: Admitting: Physician Assistant
